# Patient Record
Sex: Female | Born: 1940 | Race: Black or African American | Hispanic: No | Smoking: Former smoker
Health system: Southern US, Community
[De-identification: ages and names within clinical notes are randomized; demographics above are authoritative.]

## PROBLEM LIST (undated history)

## (undated) DIAGNOSIS — I720 Aneurysm of carotid artery: Secondary | ICD-10-CM

## (undated) DIAGNOSIS — G4730 Sleep apnea, unspecified: Secondary | ICD-10-CM

## (undated) DIAGNOSIS — K589 Irritable bowel syndrome without diarrhea: Secondary | ICD-10-CM

## (undated) DIAGNOSIS — M545 Low back pain, unspecified: Secondary | ICD-10-CM

## (undated) DIAGNOSIS — F32A Depression, unspecified: Secondary | ICD-10-CM

## (undated) DIAGNOSIS — F419 Anxiety disorder, unspecified: Secondary | ICD-10-CM

## (undated) DIAGNOSIS — Z860100 Personal history of colon polyps, unspecified: Secondary | ICD-10-CM

## (undated) DIAGNOSIS — Z8669 Personal history of other diseases of the nervous system and sense organs: Secondary | ICD-10-CM

## (undated) DIAGNOSIS — M1990 Unspecified osteoarthritis, unspecified site: Secondary | ICD-10-CM

## (undated) DIAGNOSIS — K219 Gastro-esophageal reflux disease without esophagitis: Secondary | ICD-10-CM

## (undated) DIAGNOSIS — F329 Major depressive disorder, single episode, unspecified: Secondary | ICD-10-CM

## (undated) DIAGNOSIS — I10 Essential (primary) hypertension: Secondary | ICD-10-CM

## (undated) DIAGNOSIS — Z87440 Personal history of urinary (tract) infections: Secondary | ICD-10-CM

## (undated) DIAGNOSIS — E039 Hypothyroidism, unspecified: Secondary | ICD-10-CM

## (undated) DIAGNOSIS — M810 Age-related osteoporosis without current pathological fracture: Secondary | ICD-10-CM

## (undated) DIAGNOSIS — I639 Cerebral infarction, unspecified: Secondary | ICD-10-CM

## (undated) DIAGNOSIS — N319 Neuromuscular dysfunction of bladder, unspecified: Secondary | ICD-10-CM

## (undated) DIAGNOSIS — G5600 Carpal tunnel syndrome, unspecified upper limb: Secondary | ICD-10-CM

## (undated) DIAGNOSIS — E1140 Type 2 diabetes mellitus with diabetic neuropathy, unspecified: Secondary | ICD-10-CM

## (undated) DIAGNOSIS — N183 Chronic kidney disease, stage 3 unspecified: Secondary | ICD-10-CM

## (undated) DIAGNOSIS — J189 Pneumonia, unspecified organism: Secondary | ICD-10-CM

## (undated) HISTORY — DX: Gastro-esophageal reflux disease without esophagitis: K21.9

## (undated) HISTORY — PX: SHOULDER SURGERY: SHX246

## (undated) HISTORY — DX: Low back pain: M54.5

## (undated) HISTORY — DX: Personal history of colonic polyps: Z86.010

## (undated) HISTORY — DX: Anxiety disorder, unspecified: F41.9

## (undated) HISTORY — DX: Irritable bowel syndrome, unspecified: K58.9

## (undated) HISTORY — PX: VESICOVAGINAL FISTULA CLOSURE W/ TAH: SUR271

## (undated) HISTORY — PX: TUBAL LIGATION: SHX77

## (undated) HISTORY — DX: Unspecified osteoarthritis, unspecified site: M19.90

## (undated) HISTORY — DX: Personal history of urinary (tract) infections: Z87.440

## (undated) HISTORY — DX: Personal history of colon polyps, unspecified: Z86.0100

## (undated) HISTORY — DX: Hypothyroidism, unspecified: E03.9

## (undated) HISTORY — PX: EYE SURGERY: SHX253

## (undated) HISTORY — PX: OTHER SURGICAL HISTORY: SHX169

## (undated) HISTORY — PX: CARDIAC CATHETERIZATION: SHX172

## (undated) HISTORY — DX: Depression, unspecified: F32.A

## (undated) HISTORY — DX: Chronic kidney disease, stage 3 (moderate): N18.3

## (undated) HISTORY — DX: Neuromuscular dysfunction of bladder, unspecified: N31.9

## (undated) HISTORY — DX: Essential (primary) hypertension: I10

## (undated) HISTORY — DX: Major depressive disorder, single episode, unspecified: F32.9

## (undated) HISTORY — DX: Sleep apnea, unspecified: G47.30

## (undated) HISTORY — DX: Carpal tunnel syndrome, unspecified upper limb: G56.00

## (undated) HISTORY — DX: Low back pain, unspecified: M54.50

## (undated) HISTORY — PX: CATARACT EXTRACTION W/ INTRAOCULAR LENS IMPLANT: SHX1309

## (undated) HISTORY — DX: Personal history of other diseases of the nervous system and sense organs: Z86.69

## (undated) HISTORY — DX: Type 2 diabetes mellitus with diabetic neuropathy, unspecified: E11.40

## (undated) HISTORY — DX: Chronic kidney disease, stage 3 unspecified: N18.30

## (undated) HISTORY — PX: SPINAL CORD STIMULATOR INSERTION: SHX5378

## (undated) HISTORY — DX: Aneurysm of carotid artery: I72.0

## (undated) HISTORY — PX: ABDOMINAL HYSTERECTOMY: SHX81

## (undated) HISTORY — DX: Morbid (severe) obesity due to excess calories: E66.01

## (undated) HISTORY — DX: Cerebral infarction, unspecified: I63.9

## (undated) HISTORY — DX: Age-related osteoporosis without current pathological fracture: M81.0

---

## 1990-04-03 HISTORY — PX: OTHER SURGICAL HISTORY: SHX169

## 1998-02-22 ENCOUNTER — Ambulatory Visit (HOSPITAL_COMMUNITY): Admission: RE | Admit: 1998-02-22 | Discharge: 1998-02-22 | Payer: Self-pay | Admitting: Cardiovascular Disease

## 1998-10-10 ENCOUNTER — Inpatient Hospital Stay (HOSPITAL_COMMUNITY): Admission: EM | Admit: 1998-10-10 | Discharge: 1998-10-13 | Payer: Self-pay | Admitting: Emergency Medicine

## 2000-04-03 HISTORY — PX: OTHER SURGICAL HISTORY: SHX169

## 2000-05-12 ENCOUNTER — Ambulatory Visit (HOSPITAL_COMMUNITY): Admission: RE | Admit: 2000-05-12 | Discharge: 2000-05-12 | Payer: Self-pay | Admitting: Sports Medicine

## 2000-05-12 ENCOUNTER — Encounter: Payer: Self-pay | Admitting: Sports Medicine

## 2000-06-07 ENCOUNTER — Encounter: Payer: Self-pay | Admitting: Orthopedic Surgery

## 2000-06-13 ENCOUNTER — Encounter: Payer: Self-pay | Admitting: Orthopedic Surgery

## 2000-06-13 ENCOUNTER — Inpatient Hospital Stay (HOSPITAL_COMMUNITY): Admission: RE | Admit: 2000-06-13 | Discharge: 2000-06-18 | Payer: Self-pay | Admitting: Orthopedic Surgery

## 2000-06-15 ENCOUNTER — Encounter: Payer: Self-pay | Admitting: Orthopedic Surgery

## 2000-07-23 ENCOUNTER — Encounter: Payer: Self-pay | Admitting: Internal Medicine

## 2000-07-23 ENCOUNTER — Ambulatory Visit (HOSPITAL_COMMUNITY): Admission: RE | Admit: 2000-07-23 | Discharge: 2000-07-23 | Payer: Self-pay | Admitting: Internal Medicine

## 2000-07-25 ENCOUNTER — Encounter (HOSPITAL_COMMUNITY): Admission: RE | Admit: 2000-07-25 | Discharge: 2000-08-24 | Payer: Self-pay | Admitting: Orthopedic Surgery

## 2000-08-28 ENCOUNTER — Encounter (HOSPITAL_COMMUNITY): Admission: RE | Admit: 2000-08-28 | Discharge: 2000-09-27 | Payer: Self-pay | Admitting: Orthopedic Surgery

## 2000-09-01 ENCOUNTER — Encounter: Payer: Self-pay | Admitting: Family Medicine

## 2000-09-01 ENCOUNTER — Ambulatory Visit (HOSPITAL_COMMUNITY): Admission: RE | Admit: 2000-09-01 | Discharge: 2000-09-01 | Payer: Self-pay | Admitting: Family Medicine

## 2001-05-02 ENCOUNTER — Ambulatory Visit (HOSPITAL_COMMUNITY): Admission: RE | Admit: 2001-05-02 | Discharge: 2001-05-02 | Payer: Self-pay | Admitting: Cardiology

## 2001-07-07 ENCOUNTER — Emergency Department (HOSPITAL_COMMUNITY): Admission: EM | Admit: 2001-07-07 | Discharge: 2001-07-08 | Payer: Self-pay | Admitting: *Deleted

## 2001-07-18 ENCOUNTER — Encounter: Payer: Self-pay | Admitting: Family Medicine

## 2001-07-18 ENCOUNTER — Ambulatory Visit (HOSPITAL_COMMUNITY): Admission: RE | Admit: 2001-07-18 | Discharge: 2001-07-18 | Payer: Self-pay | Admitting: Family Medicine

## 2001-11-28 ENCOUNTER — Encounter: Payer: Self-pay | Admitting: Urology

## 2001-11-28 ENCOUNTER — Ambulatory Visit (HOSPITAL_COMMUNITY): Admission: RE | Admit: 2001-11-28 | Discharge: 2001-11-28 | Payer: Self-pay | Admitting: Urology

## 2002-03-19 ENCOUNTER — Encounter: Payer: Self-pay | Admitting: Cardiology

## 2002-03-19 ENCOUNTER — Encounter: Payer: Self-pay | Admitting: Emergency Medicine

## 2002-03-19 ENCOUNTER — Inpatient Hospital Stay (HOSPITAL_COMMUNITY): Admission: EM | Admit: 2002-03-19 | Discharge: 2002-03-22 | Payer: Self-pay | Admitting: Emergency Medicine

## 2002-03-20 ENCOUNTER — Encounter: Payer: Self-pay | Admitting: Cardiovascular Disease

## 2002-04-03 HISTORY — PX: CEREBRAL ANEURYSM REPAIR: SHX164

## 2002-04-18 ENCOUNTER — Ambulatory Visit (HOSPITAL_COMMUNITY): Admission: RE | Admit: 2002-04-18 | Discharge: 2002-04-18 | Payer: Self-pay | Admitting: Interventional Radiology

## 2002-04-29 ENCOUNTER — Observation Stay (HOSPITAL_COMMUNITY): Admission: RE | Admit: 2002-04-29 | Discharge: 2002-04-29 | Payer: Self-pay | Admitting: Interventional Radiology

## 2002-05-02 ENCOUNTER — Ambulatory Visit: Admission: RE | Admit: 2002-05-02 | Discharge: 2002-05-02 | Payer: Self-pay | Admitting: Interventional Radiology

## 2002-05-08 ENCOUNTER — Inpatient Hospital Stay (HOSPITAL_COMMUNITY): Admission: RE | Admit: 2002-05-08 | Discharge: 2002-05-10 | Payer: Self-pay | Admitting: Interventional Radiology

## 2002-06-24 ENCOUNTER — Encounter: Payer: Self-pay | Admitting: Family Medicine

## 2002-06-24 ENCOUNTER — Ambulatory Visit (HOSPITAL_COMMUNITY): Admission: RE | Admit: 2002-06-24 | Discharge: 2002-06-24 | Payer: Self-pay | Admitting: Family Medicine

## 2002-07-28 ENCOUNTER — Encounter (HOSPITAL_COMMUNITY): Admission: RE | Admit: 2002-07-28 | Discharge: 2002-08-27 | Payer: Self-pay | Admitting: Orthopedic Surgery

## 2002-08-06 ENCOUNTER — Ambulatory Visit (HOSPITAL_COMMUNITY): Admission: RE | Admit: 2002-08-06 | Discharge: 2002-08-06 | Payer: Self-pay | Admitting: Interventional Radiology

## 2002-09-05 ENCOUNTER — Encounter: Payer: Self-pay | Admitting: *Deleted

## 2002-09-05 ENCOUNTER — Emergency Department (HOSPITAL_COMMUNITY): Admission: EM | Admit: 2002-09-05 | Discharge: 2002-09-05 | Payer: Self-pay | Admitting: *Deleted

## 2002-10-21 ENCOUNTER — Encounter: Payer: Self-pay | Admitting: Emergency Medicine

## 2002-10-21 ENCOUNTER — Inpatient Hospital Stay (HOSPITAL_COMMUNITY): Admission: EM | Admit: 2002-10-21 | Discharge: 2002-10-23 | Payer: Self-pay | Admitting: Emergency Medicine

## 2003-03-24 ENCOUNTER — Ambulatory Visit (HOSPITAL_COMMUNITY): Admission: RE | Admit: 2003-03-24 | Discharge: 2003-03-24 | Payer: Self-pay | Admitting: Family Medicine

## 2003-03-30 ENCOUNTER — Ambulatory Visit (HOSPITAL_COMMUNITY): Admission: RE | Admit: 2003-03-30 | Discharge: 2003-03-30 | Payer: Self-pay | Admitting: Family Medicine

## 2004-04-24 ENCOUNTER — Emergency Department (HOSPITAL_COMMUNITY): Admission: EM | Admit: 2004-04-24 | Discharge: 2004-04-24 | Payer: Self-pay | Admitting: Emergency Medicine

## 2004-06-23 ENCOUNTER — Emergency Department (HOSPITAL_COMMUNITY): Admission: EM | Admit: 2004-06-23 | Discharge: 2004-06-23 | Payer: Self-pay | Admitting: Emergency Medicine

## 2004-06-24 ENCOUNTER — Ambulatory Visit (HOSPITAL_COMMUNITY): Admission: RE | Admit: 2004-06-24 | Discharge: 2004-06-24 | Payer: Self-pay | Admitting: Family Medicine

## 2004-06-28 ENCOUNTER — Emergency Department (HOSPITAL_COMMUNITY): Admission: EM | Admit: 2004-06-28 | Discharge: 2004-06-28 | Payer: Self-pay | Admitting: *Deleted

## 2004-07-01 ENCOUNTER — Ambulatory Visit (HOSPITAL_COMMUNITY): Admission: RE | Admit: 2004-07-01 | Discharge: 2004-07-01 | Payer: Self-pay | Admitting: Interventional Radiology

## 2004-11-30 ENCOUNTER — Ambulatory Visit (HOSPITAL_COMMUNITY): Admission: RE | Admit: 2004-11-30 | Discharge: 2004-11-30 | Payer: Self-pay | Admitting: Internal Medicine

## 2005-02-17 ENCOUNTER — Ambulatory Visit (HOSPITAL_COMMUNITY): Admission: RE | Admit: 2005-02-17 | Discharge: 2005-02-17 | Payer: Self-pay | Admitting: Family Medicine

## 2005-05-17 ENCOUNTER — Ambulatory Visit (HOSPITAL_COMMUNITY): Admission: RE | Admit: 2005-05-17 | Discharge: 2005-05-17 | Payer: Self-pay | Admitting: Family Medicine

## 2006-02-07 ENCOUNTER — Ambulatory Visit: Payer: Self-pay | Admitting: Internal Medicine

## 2006-02-20 ENCOUNTER — Ambulatory Visit: Payer: Self-pay | Admitting: Internal Medicine

## 2006-03-08 ENCOUNTER — Ambulatory Visit (HOSPITAL_COMMUNITY): Admission: RE | Admit: 2006-03-08 | Discharge: 2006-03-08 | Payer: Self-pay | Admitting: Family Medicine

## 2006-03-15 ENCOUNTER — Ambulatory Visit (HOSPITAL_COMMUNITY): Admission: RE | Admit: 2006-03-15 | Discharge: 2006-03-15 | Payer: Self-pay | Admitting: Orthopedic Surgery

## 2006-03-15 ENCOUNTER — Ambulatory Visit: Payer: Self-pay | Admitting: Orthopedic Surgery

## 2006-03-22 ENCOUNTER — Encounter: Payer: Self-pay | Admitting: Orthopedic Surgery

## 2006-03-30 ENCOUNTER — Ambulatory Visit (HOSPITAL_COMMUNITY): Admission: RE | Admit: 2006-03-30 | Discharge: 2006-03-30 | Payer: Self-pay | Admitting: Orthopedic Surgery

## 2006-03-30 ENCOUNTER — Encounter: Payer: Self-pay | Admitting: Orthopedic Surgery

## 2006-04-09 ENCOUNTER — Ambulatory Visit: Payer: Self-pay | Admitting: Orthopedic Surgery

## 2006-05-04 ENCOUNTER — Ambulatory Visit (HOSPITAL_COMMUNITY): Admission: RE | Admit: 2006-05-04 | Discharge: 2006-05-04 | Payer: Self-pay | Admitting: General Surgery

## 2006-05-24 ENCOUNTER — Ambulatory Visit: Payer: Self-pay | Admitting: Gastroenterology

## 2006-06-14 ENCOUNTER — Ambulatory Visit (HOSPITAL_COMMUNITY): Admission: RE | Admit: 2006-06-14 | Discharge: 2006-06-14 | Payer: Self-pay | Admitting: Gastroenterology

## 2006-06-14 ENCOUNTER — Encounter (INDEPENDENT_AMBULATORY_CARE_PROVIDER_SITE_OTHER): Payer: Self-pay | Admitting: *Deleted

## 2006-06-14 ENCOUNTER — Ambulatory Visit: Payer: Self-pay | Admitting: Gastroenterology

## 2006-06-14 HISTORY — PX: COLONOSCOPY: SHX174

## 2006-06-15 ENCOUNTER — Ambulatory Visit: Payer: Self-pay | Admitting: Physical Medicine and Rehabilitation

## 2006-06-15 ENCOUNTER — Encounter
Admission: RE | Admit: 2006-06-15 | Discharge: 2006-09-13 | Payer: Self-pay | Admitting: Physical Medicine and Rehabilitation

## 2006-07-04 ENCOUNTER — Encounter
Admission: RE | Admit: 2006-07-04 | Discharge: 2006-10-02 | Payer: Self-pay | Admitting: Physical Medicine & Rehabilitation

## 2006-07-09 ENCOUNTER — Ambulatory Visit: Payer: Self-pay | Admitting: Physical Medicine & Rehabilitation

## 2006-07-10 ENCOUNTER — Ambulatory Visit: Payer: Self-pay | Admitting: Gastroenterology

## 2006-07-20 ENCOUNTER — Emergency Department (HOSPITAL_COMMUNITY): Admission: EM | Admit: 2006-07-20 | Discharge: 2006-07-20 | Payer: Self-pay | Admitting: Emergency Medicine

## 2006-08-02 ENCOUNTER — Encounter (HOSPITAL_COMMUNITY)
Admission: RE | Admit: 2006-08-02 | Discharge: 2006-09-01 | Payer: Self-pay | Admitting: Physical Medicine and Rehabilitation

## 2006-08-21 ENCOUNTER — Ambulatory Visit: Payer: Self-pay | Admitting: Physical Medicine & Rehabilitation

## 2006-09-06 ENCOUNTER — Ambulatory Visit: Payer: Self-pay | Admitting: Gastroenterology

## 2006-09-17 ENCOUNTER — Encounter
Admission: RE | Admit: 2006-09-17 | Discharge: 2006-12-16 | Payer: Self-pay | Admitting: Physical Medicine and Rehabilitation

## 2006-11-07 ENCOUNTER — Ambulatory Visit: Payer: Self-pay | Admitting: Orthopedic Surgery

## 2006-11-30 ENCOUNTER — Ambulatory Visit (HOSPITAL_COMMUNITY): Admission: RE | Admit: 2006-11-30 | Discharge: 2006-11-30 | Payer: Self-pay | Admitting: Family Medicine

## 2006-12-11 ENCOUNTER — Ambulatory Visit: Payer: Self-pay | Admitting: Physical Medicine and Rehabilitation

## 2006-12-24 ENCOUNTER — Encounter
Admission: RE | Admit: 2006-12-24 | Discharge: 2007-03-24 | Payer: Self-pay | Admitting: Physical Medicine & Rehabilitation

## 2006-12-25 ENCOUNTER — Ambulatory Visit: Payer: Self-pay | Admitting: Physical Medicine & Rehabilitation

## 2006-12-26 ENCOUNTER — Ambulatory Visit: Payer: Self-pay | Admitting: Gastroenterology

## 2007-01-23 ENCOUNTER — Ambulatory Visit: Payer: Self-pay | Admitting: Physical Medicine and Rehabilitation

## 2007-03-19 ENCOUNTER — Ambulatory Visit: Payer: Self-pay | Admitting: Gastroenterology

## 2007-04-04 HISTORY — PX: OTHER SURGICAL HISTORY: SHX169

## 2007-04-16 ENCOUNTER — Ambulatory Visit: Payer: Self-pay | Admitting: Physical Medicine and Rehabilitation

## 2007-04-16 ENCOUNTER — Encounter
Admission: RE | Admit: 2007-04-16 | Discharge: 2007-07-15 | Payer: Self-pay | Admitting: Physical Medicine and Rehabilitation

## 2007-04-18 ENCOUNTER — Ambulatory Visit: Payer: Self-pay | Admitting: Gastroenterology

## 2007-04-30 ENCOUNTER — Emergency Department (HOSPITAL_COMMUNITY): Admission: EM | Admit: 2007-04-30 | Discharge: 2007-04-30 | Payer: Self-pay | Admitting: Emergency Medicine

## 2007-05-06 ENCOUNTER — Emergency Department (HOSPITAL_COMMUNITY): Admission: EM | Admit: 2007-05-06 | Discharge: 2007-05-06 | Payer: Self-pay | Admitting: Emergency Medicine

## 2007-05-06 LAB — CONVERTED CEMR LAB
ALT: 18 units/L
AST: 21 units/L
Albumin: 3.8 g/dL
Basophils Absolute: 0 10*3/uL
Basophils Relative: 0 %
Chloride: 101 meq/L
Creatinine, Ser: 1.59 mg/dL
Eosinophils Absolute: 0 10*3/uL
Eosinophils Relative: 0 %
Glucose, Bld: 95 mg/dL
HCT: 37.7 %
Lymphocytes Relative: 19 %
Lymphs Abs: 1.5 10*3/uL
MCHC: 33.9 g/dL
MCV: 87.4 fL
Monocytes Absolute: 0.3 10*3/uL
Neutro Abs: 6.3 10*3/uL
Neutrophils Relative %: 77 %
Platelets: 257 10*3/uL
Potassium: 4.8 meq/L
RBC: 4.31 M/uL
Total Bilirubin: 0.5 mg/dL

## 2007-05-07 ENCOUNTER — Ambulatory Visit (HOSPITAL_COMMUNITY): Admission: RE | Admit: 2007-05-07 | Discharge: 2007-05-07 | Payer: Self-pay | Admitting: Interventional Radiology

## 2007-05-13 ENCOUNTER — Ambulatory Visit: Payer: Self-pay | Admitting: Internal Medicine

## 2007-05-13 DIAGNOSIS — K589 Irritable bowel syndrome without diarrhea: Secondary | ICD-10-CM | POA: Insufficient documentation

## 2007-05-13 DIAGNOSIS — F329 Major depressive disorder, single episode, unspecified: Secondary | ICD-10-CM

## 2007-05-13 DIAGNOSIS — E1122 Type 2 diabetes mellitus with diabetic chronic kidney disease: Secondary | ICD-10-CM

## 2007-05-13 DIAGNOSIS — Z8669 Personal history of other diseases of the nervous system and sense organs: Secondary | ICD-10-CM | POA: Insufficient documentation

## 2007-05-13 DIAGNOSIS — R32 Unspecified urinary incontinence: Secondary | ICD-10-CM

## 2007-05-13 DIAGNOSIS — I10 Essential (primary) hypertension: Secondary | ICD-10-CM

## 2007-05-13 DIAGNOSIS — Z87898 Personal history of other specified conditions: Secondary | ICD-10-CM | POA: Insufficient documentation

## 2007-05-13 DIAGNOSIS — M129 Arthropathy, unspecified: Secondary | ICD-10-CM | POA: Insufficient documentation

## 2007-05-13 DIAGNOSIS — J45909 Unspecified asthma, uncomplicated: Secondary | ICD-10-CM | POA: Insufficient documentation

## 2007-05-13 DIAGNOSIS — K219 Gastro-esophageal reflux disease without esophagitis: Secondary | ICD-10-CM

## 2007-05-13 DIAGNOSIS — N183 Chronic kidney disease, stage 3 (moderate): Secondary | ICD-10-CM

## 2007-05-13 DIAGNOSIS — N318 Other neuromuscular dysfunction of bladder: Secondary | ICD-10-CM | POA: Insufficient documentation

## 2007-05-13 DIAGNOSIS — R109 Unspecified abdominal pain: Secondary | ICD-10-CM

## 2007-05-13 DIAGNOSIS — N390 Urinary tract infection, site not specified: Secondary | ICD-10-CM

## 2007-05-13 LAB — CONVERTED CEMR LAB
Blood Glucose, Fingerstick: 119
Hgb A1c MFr Bld: 6 %

## 2007-05-14 ENCOUNTER — Telehealth (INDEPENDENT_AMBULATORY_CARE_PROVIDER_SITE_OTHER): Payer: Self-pay | Admitting: *Deleted

## 2007-05-14 ENCOUNTER — Encounter (INDEPENDENT_AMBULATORY_CARE_PROVIDER_SITE_OTHER): Payer: Self-pay | Admitting: Internal Medicine

## 2007-05-14 LAB — CONVERTED CEMR LAB
CO2: 18 meq/L — ABNORMAL LOW (ref 19–32)
Calcium: 9.1 mg/dL (ref 8.4–10.5)
Sodium: 137 meq/L (ref 135–145)

## 2007-05-20 ENCOUNTER — Ambulatory Visit (HOSPITAL_COMMUNITY): Admission: RE | Admit: 2007-05-20 | Discharge: 2007-05-20 | Payer: Self-pay | Admitting: Internal Medicine

## 2007-05-21 ENCOUNTER — Ambulatory Visit: Payer: Self-pay | Admitting: Physical Medicine and Rehabilitation

## 2007-05-21 ENCOUNTER — Encounter (HOSPITAL_COMMUNITY): Admission: RE | Admit: 2007-05-21 | Discharge: 2007-06-20 | Payer: Self-pay | Admitting: Internal Medicine

## 2007-05-23 ENCOUNTER — Telehealth (INDEPENDENT_AMBULATORY_CARE_PROVIDER_SITE_OTHER): Payer: Self-pay | Admitting: *Deleted

## 2007-05-24 ENCOUNTER — Encounter (INDEPENDENT_AMBULATORY_CARE_PROVIDER_SITE_OTHER): Payer: Self-pay | Admitting: Internal Medicine

## 2007-05-28 ENCOUNTER — Encounter (INDEPENDENT_AMBULATORY_CARE_PROVIDER_SITE_OTHER): Payer: Self-pay | Admitting: Internal Medicine

## 2007-05-28 ENCOUNTER — Telehealth (INDEPENDENT_AMBULATORY_CARE_PROVIDER_SITE_OTHER): Payer: Self-pay | Admitting: *Deleted

## 2007-05-29 ENCOUNTER — Encounter (INDEPENDENT_AMBULATORY_CARE_PROVIDER_SITE_OTHER): Payer: Self-pay | Admitting: Internal Medicine

## 2007-05-31 ENCOUNTER — Telehealth (INDEPENDENT_AMBULATORY_CARE_PROVIDER_SITE_OTHER): Payer: Self-pay | Admitting: *Deleted

## 2007-06-18 ENCOUNTER — Ambulatory Visit: Payer: Self-pay | Admitting: Internal Medicine

## 2007-06-18 DIAGNOSIS — F411 Generalized anxiety disorder: Secondary | ICD-10-CM | POA: Insufficient documentation

## 2007-06-18 LAB — CONVERTED CEMR LAB: Hemoglobin: 12.1 g/dL

## 2007-06-28 ENCOUNTER — Encounter (INDEPENDENT_AMBULATORY_CARE_PROVIDER_SITE_OTHER): Payer: Self-pay | Admitting: Internal Medicine

## 2007-07-05 ENCOUNTER — Ambulatory Visit: Payer: Self-pay | Admitting: Internal Medicine

## 2007-07-05 DIAGNOSIS — R609 Edema, unspecified: Secondary | ICD-10-CM | POA: Insufficient documentation

## 2007-07-08 ENCOUNTER — Telehealth (INDEPENDENT_AMBULATORY_CARE_PROVIDER_SITE_OTHER): Payer: Self-pay | Admitting: Internal Medicine

## 2007-07-10 ENCOUNTER — Encounter
Admission: RE | Admit: 2007-07-10 | Discharge: 2007-07-10 | Payer: Self-pay | Admitting: Physical Medicine and Rehabilitation

## 2007-07-26 ENCOUNTER — Ambulatory Visit: Payer: Self-pay | Admitting: Internal Medicine

## 2007-07-26 ENCOUNTER — Encounter (INDEPENDENT_AMBULATORY_CARE_PROVIDER_SITE_OTHER): Payer: Self-pay | Admitting: Internal Medicine

## 2007-07-29 ENCOUNTER — Ambulatory Visit (HOSPITAL_COMMUNITY): Admission: RE | Admit: 2007-07-29 | Discharge: 2007-07-29 | Payer: Self-pay | Admitting: Internal Medicine

## 2007-08-08 ENCOUNTER — Encounter (INDEPENDENT_AMBULATORY_CARE_PROVIDER_SITE_OTHER): Payer: Self-pay | Admitting: Internal Medicine

## 2007-08-08 ENCOUNTER — Ambulatory Visit: Payer: Self-pay | Admitting: Gastroenterology

## 2007-08-12 ENCOUNTER — Ambulatory Visit (HOSPITAL_COMMUNITY): Admission: RE | Admit: 2007-08-12 | Discharge: 2007-08-12 | Payer: Self-pay | Admitting: Gastroenterology

## 2007-08-12 ENCOUNTER — Ambulatory Visit: Payer: Self-pay | Admitting: Gastroenterology

## 2007-08-12 ENCOUNTER — Encounter: Payer: Self-pay | Admitting: Gastroenterology

## 2007-08-12 HISTORY — PX: ESOPHAGOGASTRODUODENOSCOPY: SHX1529

## 2007-08-15 ENCOUNTER — Ambulatory Visit: Payer: Self-pay | Admitting: Gastroenterology

## 2007-08-28 ENCOUNTER — Ambulatory Visit (HOSPITAL_COMMUNITY): Admission: RE | Admit: 2007-08-28 | Discharge: 2007-08-28 | Payer: Self-pay | Admitting: General Surgery

## 2007-09-16 ENCOUNTER — Encounter (INDEPENDENT_AMBULATORY_CARE_PROVIDER_SITE_OTHER): Payer: Self-pay | Admitting: Internal Medicine

## 2007-09-18 ENCOUNTER — Encounter (HOSPITAL_COMMUNITY): Admission: RE | Admit: 2007-09-18 | Discharge: 2007-10-18 | Payer: Self-pay | Admitting: Gastroenterology

## 2007-09-25 ENCOUNTER — Ambulatory Visit: Payer: Self-pay | Admitting: Internal Medicine

## 2007-09-26 ENCOUNTER — Encounter (INDEPENDENT_AMBULATORY_CARE_PROVIDER_SITE_OTHER): Payer: Self-pay | Admitting: Internal Medicine

## 2007-09-27 LAB — CONVERTED CEMR LAB
Creatinine, Urine: 146.6 mg/dL
Microalb, Ur: 0.97 mg/dL (ref 0.00–1.89)

## 2007-10-08 ENCOUNTER — Encounter (INDEPENDENT_AMBULATORY_CARE_PROVIDER_SITE_OTHER): Payer: Self-pay | Admitting: Internal Medicine

## 2007-10-24 ENCOUNTER — Ambulatory Visit: Payer: Self-pay | Admitting: Gastroenterology

## 2007-10-24 ENCOUNTER — Encounter (INDEPENDENT_AMBULATORY_CARE_PROVIDER_SITE_OTHER): Payer: Self-pay | Admitting: Internal Medicine

## 2007-10-25 ENCOUNTER — Encounter (INDEPENDENT_AMBULATORY_CARE_PROVIDER_SITE_OTHER): Payer: Self-pay | Admitting: Internal Medicine

## 2007-11-05 ENCOUNTER — Ambulatory Visit: Payer: Self-pay | Admitting: Internal Medicine

## 2007-11-06 ENCOUNTER — Encounter (INDEPENDENT_AMBULATORY_CARE_PROVIDER_SITE_OTHER): Payer: Self-pay | Admitting: Internal Medicine

## 2007-11-07 LAB — CONVERTED CEMR LAB
ALT: 14 units/L (ref 0–35)
AST: 13 units/L (ref 0–37)
Albumin: 4.3 g/dL (ref 3.5–5.2)
CO2: 24 meq/L (ref 19–32)
Calcium: 8.7 mg/dL (ref 8.4–10.5)
Chloride: 104 meq/L (ref 96–112)
Glucose, Bld: 138 mg/dL — ABNORMAL HIGH (ref 70–99)
Potassium: 4.2 meq/L (ref 3.5–5.3)
TSH: 1.589 microintl units/mL (ref 0.350–4.50)

## 2007-11-20 ENCOUNTER — Ambulatory Visit: Payer: Self-pay | Admitting: Internal Medicine

## 2007-11-21 ENCOUNTER — Encounter (INDEPENDENT_AMBULATORY_CARE_PROVIDER_SITE_OTHER): Payer: Self-pay | Admitting: Internal Medicine

## 2007-11-21 LAB — CONVERTED CEMR LAB
ALT: 18 units/L (ref 0–35)
AST: 23 units/L (ref 0–37)
Albumin: 4.7 g/dL (ref 3.5–5.2)
Alkaline Phosphatase: 84 units/L (ref 39–117)
BUN: 17 mg/dL (ref 6–23)
CO2: 22 meq/L (ref 19–32)
Calcium: 9.2 mg/dL (ref 8.4–10.5)
Glucose, Bld: 135 mg/dL — ABNORMAL HIGH (ref 70–99)
Potassium: 4 meq/L (ref 3.5–5.3)
Sodium: 141 meq/L (ref 135–145)
TSH: 2.886 microintl units/mL (ref 0.350–4.50)
Total Bilirubin: 0.4 mg/dL (ref 0.3–1.2)

## 2007-12-03 ENCOUNTER — Ambulatory Visit (HOSPITAL_COMMUNITY): Admission: RE | Admit: 2007-12-03 | Discharge: 2007-12-03 | Payer: Self-pay | Admitting: Internal Medicine

## 2007-12-25 ENCOUNTER — Ambulatory Visit: Payer: Self-pay | Admitting: Internal Medicine

## 2007-12-25 LAB — CONVERTED CEMR LAB: Hgb A1c MFr Bld: 6.3 %

## 2008-01-14 ENCOUNTER — Encounter (INDEPENDENT_AMBULATORY_CARE_PROVIDER_SITE_OTHER): Payer: Self-pay | Admitting: Internal Medicine

## 2008-01-14 ENCOUNTER — Ambulatory Visit: Payer: Self-pay | Admitting: Gastroenterology

## 2008-01-17 ENCOUNTER — Encounter (INDEPENDENT_AMBULATORY_CARE_PROVIDER_SITE_OTHER): Payer: Self-pay | Admitting: Internal Medicine

## 2008-01-27 ENCOUNTER — Ambulatory Visit: Payer: Self-pay | Admitting: Internal Medicine

## 2008-02-21 ENCOUNTER — Encounter (INDEPENDENT_AMBULATORY_CARE_PROVIDER_SITE_OTHER): Payer: Self-pay | Admitting: Internal Medicine

## 2008-02-22 ENCOUNTER — Emergency Department (HOSPITAL_COMMUNITY): Admission: EM | Admit: 2008-02-22 | Discharge: 2008-02-22 | Payer: Self-pay | Admitting: Emergency Medicine

## 2008-03-03 ENCOUNTER — Ambulatory Visit (HOSPITAL_BASED_OUTPATIENT_CLINIC_OR_DEPARTMENT_OTHER): Admission: RE | Admit: 2008-03-03 | Discharge: 2008-03-03 | Payer: Self-pay | Admitting: Urology

## 2008-03-13 ENCOUNTER — Ambulatory Visit: Payer: Self-pay | Admitting: Internal Medicine

## 2008-03-24 ENCOUNTER — Ambulatory Visit: Payer: Self-pay | Admitting: Internal Medicine

## 2008-03-24 LAB — CONVERTED CEMR LAB: Blood Glucose, Fingerstick: 149

## 2008-04-09 ENCOUNTER — Encounter (INDEPENDENT_AMBULATORY_CARE_PROVIDER_SITE_OTHER): Payer: Self-pay | Admitting: Internal Medicine

## 2008-04-16 ENCOUNTER — Encounter (INDEPENDENT_AMBULATORY_CARE_PROVIDER_SITE_OTHER): Payer: Self-pay | Admitting: Internal Medicine

## 2008-04-17 ENCOUNTER — Telehealth (INDEPENDENT_AMBULATORY_CARE_PROVIDER_SITE_OTHER): Payer: Self-pay | Admitting: Internal Medicine

## 2008-05-12 ENCOUNTER — Ambulatory Visit: Payer: Self-pay | Admitting: Internal Medicine

## 2008-05-12 LAB — CONVERTED CEMR LAB
Bilirubin Urine: NEGATIVE
Protein, U semiquant: 100
Specific Gravity, Urine: 1.025

## 2008-05-13 ENCOUNTER — Encounter (INDEPENDENT_AMBULATORY_CARE_PROVIDER_SITE_OTHER): Payer: Self-pay | Admitting: Internal Medicine

## 2008-06-15 ENCOUNTER — Encounter (INDEPENDENT_AMBULATORY_CARE_PROVIDER_SITE_OTHER): Payer: Self-pay | Admitting: Internal Medicine

## 2008-06-23 ENCOUNTER — Ambulatory Visit: Payer: Self-pay | Admitting: Internal Medicine

## 2008-06-23 DIAGNOSIS — R51 Headache: Secondary | ICD-10-CM

## 2008-06-23 DIAGNOSIS — R519 Headache, unspecified: Secondary | ICD-10-CM | POA: Insufficient documentation

## 2008-06-23 DIAGNOSIS — G4700 Insomnia, unspecified: Secondary | ICD-10-CM

## 2008-06-23 LAB — HM DIABETES FOOT EXAM

## 2008-06-23 LAB — CONVERTED CEMR LAB: Blood Glucose, Fingerstick: 188

## 2008-06-24 ENCOUNTER — Encounter (INDEPENDENT_AMBULATORY_CARE_PROVIDER_SITE_OTHER): Payer: Self-pay | Admitting: Internal Medicine

## 2008-06-25 LAB — CONVERTED CEMR LAB
ALT: 12 units/L (ref 0–35)
AST: 16 units/L (ref 0–37)
Alkaline Phosphatase: 84 units/L (ref 39–117)
Basophils Relative: 1 % (ref 0–1)
CO2: 24 meq/L (ref 19–32)
Creatinine, Ser: 1.24 mg/dL — ABNORMAL HIGH (ref 0.40–1.20)
Eosinophils Absolute: 0.1 10*3/uL (ref 0.0–0.7)
LDL Cholesterol: 103 mg/dL — ABNORMAL HIGH (ref 0–99)
MCHC: 30.1 g/dL (ref 30.0–36.0)
MCV: 90 fL (ref 78.0–100.0)
Monocytes Relative: 6 % (ref 3–12)
Neutro Abs: 3.2 10*3/uL (ref 1.7–7.7)
Neutrophils Relative %: 55 % (ref 43–77)
Platelets: 255 10*3/uL (ref 150–400)
RBC: 4.21 M/uL (ref 3.87–5.11)
Sodium: 143 meq/L (ref 135–145)
Total Bilirubin: 0.3 mg/dL (ref 0.3–1.2)
Total CHOL/HDL Ratio: 3.2
Total Protein: 7.2 g/dL (ref 6.0–8.3)
VLDL: 22 mg/dL (ref 0–40)
WBC: 5.9 10*3/uL (ref 4.0–10.5)

## 2008-06-26 ENCOUNTER — Ambulatory Visit: Payer: Self-pay | Admitting: Internal Medicine

## 2008-06-26 DIAGNOSIS — J45901 Unspecified asthma with (acute) exacerbation: Secondary | ICD-10-CM | POA: Insufficient documentation

## 2008-07-03 ENCOUNTER — Encounter (INDEPENDENT_AMBULATORY_CARE_PROVIDER_SITE_OTHER): Payer: Self-pay | Admitting: Internal Medicine

## 2008-07-06 ENCOUNTER — Ambulatory Visit (HOSPITAL_COMMUNITY): Admission: RE | Admit: 2008-07-06 | Discharge: 2008-07-06 | Payer: Self-pay | Admitting: Internal Medicine

## 2008-07-23 ENCOUNTER — Ambulatory Visit: Payer: Self-pay | Admitting: Gastroenterology

## 2008-07-23 DIAGNOSIS — R112 Nausea with vomiting, unspecified: Secondary | ICD-10-CM

## 2008-08-11 ENCOUNTER — Encounter (INDEPENDENT_AMBULATORY_CARE_PROVIDER_SITE_OTHER): Payer: Self-pay | Admitting: Internal Medicine

## 2008-08-11 LAB — HM DIABETES EYE EXAM: HM Diabetic Eye Exam: NORMAL

## 2008-09-07 ENCOUNTER — Ambulatory Visit: Payer: Self-pay | Admitting: Internal Medicine

## 2008-09-07 DIAGNOSIS — M79609 Pain in unspecified limb: Secondary | ICD-10-CM

## 2008-09-07 DIAGNOSIS — M25519 Pain in unspecified shoulder: Secondary | ICD-10-CM

## 2008-11-16 ENCOUNTER — Ambulatory Visit: Payer: Self-pay | Admitting: Internal Medicine

## 2009-01-06 ENCOUNTER — Ambulatory Visit (HOSPITAL_COMMUNITY): Admission: RE | Admit: 2009-01-06 | Discharge: 2009-01-06 | Payer: Self-pay | Admitting: Family Medicine

## 2009-01-13 ENCOUNTER — Ambulatory Visit (HOSPITAL_COMMUNITY): Admission: RE | Admit: 2009-01-13 | Discharge: 2009-01-13 | Payer: Self-pay | Admitting: Family Medicine

## 2009-02-24 ENCOUNTER — Ambulatory Visit: Payer: Self-pay | Admitting: Orthopedic Surgery

## 2009-02-24 DIAGNOSIS — M1710 Unilateral primary osteoarthritis, unspecified knee: Secondary | ICD-10-CM

## 2009-02-24 DIAGNOSIS — M5126 Other intervertebral disc displacement, lumbar region: Secondary | ICD-10-CM

## 2009-02-24 DIAGNOSIS — IMO0002 Reserved for concepts with insufficient information to code with codable children: Secondary | ICD-10-CM | POA: Insufficient documentation

## 2009-03-09 ENCOUNTER — Telehealth: Payer: Self-pay | Admitting: Orthopedic Surgery

## 2009-03-10 ENCOUNTER — Ambulatory Visit (HOSPITAL_COMMUNITY): Admission: RE | Admit: 2009-03-10 | Discharge: 2009-03-10 | Payer: Self-pay | Admitting: Orthopedic Surgery

## 2009-03-17 ENCOUNTER — Ambulatory Visit: Payer: Self-pay | Admitting: Orthopedic Surgery

## 2009-03-23 ENCOUNTER — Telehealth: Payer: Self-pay | Admitting: Orthopedic Surgery

## 2009-03-24 ENCOUNTER — Telehealth: Payer: Self-pay | Admitting: Orthopedic Surgery

## 2009-03-31 ENCOUNTER — Telehealth (INDEPENDENT_AMBULATORY_CARE_PROVIDER_SITE_OTHER): Payer: Self-pay | Admitting: *Deleted

## 2009-04-07 ENCOUNTER — Telehealth: Payer: Self-pay | Admitting: Orthopedic Surgery

## 2009-04-20 ENCOUNTER — Encounter: Payer: Self-pay | Admitting: Orthopedic Surgery

## 2009-04-27 ENCOUNTER — Ambulatory Visit: Payer: Self-pay | Admitting: Gastroenterology

## 2009-06-22 ENCOUNTER — Ambulatory Visit (HOSPITAL_COMMUNITY): Admission: RE | Admit: 2009-06-22 | Discharge: 2009-06-22 | Payer: Self-pay | Admitting: Ophthalmology

## 2009-07-13 ENCOUNTER — Ambulatory Visit (HOSPITAL_COMMUNITY): Admission: RE | Admit: 2009-07-13 | Discharge: 2009-07-13 | Payer: Self-pay | Admitting: Ophthalmology

## 2009-08-28 ENCOUNTER — Emergency Department (HOSPITAL_COMMUNITY): Admission: EM | Admit: 2009-08-28 | Discharge: 2009-08-28 | Payer: Self-pay | Admitting: Emergency Medicine

## 2009-10-12 ENCOUNTER — Encounter (INDEPENDENT_AMBULATORY_CARE_PROVIDER_SITE_OTHER): Payer: Self-pay

## 2009-12-01 ENCOUNTER — Emergency Department (HOSPITAL_COMMUNITY): Admission: EM | Admit: 2009-12-01 | Discharge: 2009-12-01 | Payer: Self-pay | Admitting: Emergency Medicine

## 2010-01-11 ENCOUNTER — Ambulatory Visit (HOSPITAL_COMMUNITY): Admission: RE | Admit: 2010-01-11 | Discharge: 2010-01-11 | Payer: Self-pay | Admitting: Family Medicine

## 2010-01-14 ENCOUNTER — Encounter: Payer: Self-pay | Admitting: Orthopedic Surgery

## 2010-02-09 ENCOUNTER — Encounter: Payer: Self-pay | Admitting: Gastroenterology

## 2010-02-09 ENCOUNTER — Ambulatory Visit: Payer: Self-pay | Admitting: Gastroenterology

## 2010-02-09 DIAGNOSIS — R7401 Elevation of levels of liver transaminase levels: Secondary | ICD-10-CM | POA: Insufficient documentation

## 2010-02-09 DIAGNOSIS — R740 Nonspecific elevation of levels of transaminase and lactic acid dehydrogenase [LDH]: Secondary | ICD-10-CM

## 2010-02-10 ENCOUNTER — Ambulatory Visit (HOSPITAL_COMMUNITY): Admission: RE | Admit: 2010-02-10 | Discharge: 2010-02-10 | Payer: Self-pay | Admitting: Gastroenterology

## 2010-02-11 ENCOUNTER — Encounter: Payer: Self-pay | Admitting: Gastroenterology

## 2010-02-11 LAB — CONVERTED CEMR LAB
ALT: 107 units/L — ABNORMAL HIGH (ref 0–35)
Bilirubin, Direct: 0.2 mg/dL (ref 0.0–0.3)
Indirect Bilirubin: 0.5 mg/dL (ref 0.0–0.9)
Total Bilirubin: 0.7 mg/dL (ref 0.3–1.2)

## 2010-03-04 LAB — CONVERTED CEMR LAB
HCV Ab: NEGATIVE
Hep A IgM: NEGATIVE
Hep B C IgM: NEGATIVE
Hepatitis B Surface Ag: NEGATIVE
IgA: 388 mg/dL — ABNORMAL HIGH (ref 68–378)
IgG (Immunoglobin G), Serum: 1460 mg/dL (ref 694–1618)

## 2010-03-15 ENCOUNTER — Ambulatory Visit: Payer: Self-pay | Admitting: Gastroenterology

## 2010-03-23 ENCOUNTER — Ambulatory Visit: Payer: Self-pay | Admitting: Orthopedic Surgery

## 2010-03-23 DIAGNOSIS — M75120 Complete rotator cuff tear or rupture of unspecified shoulder, not specified as traumatic: Secondary | ICD-10-CM

## 2010-03-29 ENCOUNTER — Encounter: Payer: Self-pay | Admitting: Orthopedic Surgery

## 2010-03-31 ENCOUNTER — Telehealth: Payer: Self-pay | Admitting: Orthopedic Surgery

## 2010-04-05 ENCOUNTER — Ambulatory Visit (HOSPITAL_COMMUNITY)
Admission: RE | Admit: 2010-04-05 | Discharge: 2010-04-05 | Payer: Self-pay | Source: Home / Self Care | Attending: Orthopedic Surgery | Admitting: Orthopedic Surgery

## 2010-04-13 ENCOUNTER — Ambulatory Visit
Admission: RE | Admit: 2010-04-13 | Discharge: 2010-04-13 | Payer: Self-pay | Source: Home / Self Care | Attending: Orthopedic Surgery | Admitting: Orthopedic Surgery

## 2010-04-20 ENCOUNTER — Encounter (HOSPITAL_COMMUNITY)
Admission: RE | Admit: 2010-04-20 | Discharge: 2010-05-03 | Payer: Self-pay | Source: Home / Self Care | Attending: Orthopedic Surgery | Admitting: Orthopedic Surgery

## 2010-04-23 ENCOUNTER — Encounter: Payer: Self-pay | Admitting: Orthopedic Surgery

## 2010-05-04 ENCOUNTER — Ambulatory Visit (HOSPITAL_COMMUNITY)
Admission: RE | Admit: 2010-05-04 | Discharge: 2010-05-04 | Disposition: A | Discharge status: Home / Self Care | Payer: Medicare Other | Source: Ambulatory Visit | Attending: Orthopedic Surgery | Admitting: Orthopedic Surgery

## 2010-05-05 NOTE — Assessment & Plan Note (Signed)
Summary: review mri results aph/uhc/wkj   Visit Type:  Follow-up Referring Provider:  Dr. [name]  Primary Provider:  [name], M.D.  CC:  right shoulder pain.  History of Present Illness: I saw [name] in the office today for new problem visit.  She is a 70 years old woman with the complaint of:   right shoulder pain.  This 70 year old female was working out at [employer]. several weeks ago and was using one of the machines and felt a pop in her shoulder    Treatment: Patient Instructions: 1)  sling, and medication for pain, MRI RIGHT shoulder for rotator cuff tear Prescriptions: NORCO 5-325 MG TABS (HYDROCODONE-ACETAMINOPHEN) 1 ev 4 hours as needed for pain  #42 x 1  Meds:Actos, Diazepam, Doxepin, Lasix, Glimepiride, Metfromin, Omeprazole, Phenazopyridine, Pravastatin, Eye drops, Vitamin D2, Norco 5 for pain helps alot.  Her shoulder is a whole lot better.  Today reviewing MRI results taken 04/05/10 [facility] right shoulder.  IMPRESSION:   1.  Moderate rotator cuff tendinopathy/tendinosis.  Shallow articular surface tear involving the supraspinatus tendon as discussed above. 2.  Long head biceps tendinopathy. 3.  Degenerative labral changes without discrete labral tear. 4.  AC joint generative changes and lateral downsloping of the acromion may contribute to bony impingement.    Allergies: No Known Drug Allergies   Impression & Recommendations:  Problem # 1:  RUPTURE ROTATOR CUFF (ICD-727.61) Assessment Comment Only  The MRI was done at [facility] and it was reviewed with the report  she has basically a partial articular surface type tear of the rotator cuff with some tendinosis and tendinopathy is mainly in the articular surface. There are some labral changes, which are degenerative. There is some a.c. joint degeneration as well.  But she has improved in terms of her pain. She is she is still having some functional deficits.  Recommend physical therapy for one month. When  she returns if she has not improved, then rotator cuff repair would be indicated.  Results discussed   Orders: Physical Therapy Referral (PT) Est. Patient Level II (84696)  Patient Instructions: 1)  Go for therapy 2)  come back in a month for recheck right shoulder   Orders Added: 1)  Physical Therapy Referral [PT] 2)  Est. Patient Level II [29528]

## 2010-05-05 NOTE — Assessment & Plan Note (Signed)
Summary: GASTRIC DYSRHYTHMIA,GERD, vomiting   Visit Type:  Follow-up Visit Primary Care Provider:  [name], M.D.  Chief Complaint:  follow up and doing good.  History of Present Illness: Staying out of trouble. Getting shots in back and sugar went up. Fasting sugar: 240s. Off ASA for epidural and Actos because she belives it makes her sugar go higher. After epidural going [facility] the "[facility] Seeing Dr. Eduard Clos giving her injections. No vomiting and taking Reglan BID.   Current Medications (verified): 1)  Reglan 5 Mg  Tabs (Metoclopramide Hcl) .Marland Kitchen.. 1 By Mouth Before Meals and At Bedtime 2)  Childrens Aspirin Low Strength 81 Mg  Chew (Aspirin) .Marland Kitchen.. 1 By Mouth Once Daily 3)  Actos 45 Mg  Tabs (Pioglitazone Hcl) .Marland Kitchen.. 1 By Mouth Once Daily 4)  Aleve 220 Mg  Tabs (Naproxen Sodium) .... As Needed 5)  Flonase 50 Mcg/act  Susp (Fluticasone Propionate) .... As Needed 6)  Symbicort 160-4.5 Mcg/act Aero (Budesonide-Formoterol Fumarate) .... 2 Puffs Two Times A Day As Needed 7)  Albuterol Sulfate (2.5 Mg/77ml) 0.083%  Nebu (Albuterol Sulfate) .Marland Kitchen.. 1 in Nebulizer Once Daily As Needed 8)  Furosemide 20 Mg  Tabs (Furosemide) .Marland Kitchen.. 1 By Mouth Once Daily 9)  Omeprazole 20 Mg  Cpdr (Omeprazole) .Marland Kitchen.. 1 By Mouth Once Daily 10)  Pravastatin Sodium 20 Mg Tabs (Pravastatin Sodium) .... Take One Tab Once Daily 11)  Ventolin Hfa 108 (90 Base) Mcg/act Aers (Albuterol Sulfate) .... 2 Puffs Q 6 Hours As Needed Shortness of Breath 12)  Nitrofurantoin Macrocrystal 100 Mg Caps (Nitrofurantoin Macrocrystal) .... Two Times A Day  Allergies (verified): No Known Drug Allergies  Past History:  Past Medical History: Last updated: 02/24/2009 Asthma Situational depression Diabetes mellitus, type II GERD Hypertension Low back pain IBS overactive bladder/urinary incontinence recurrent UTI's cataracts/glaucoma arthritis migraines, hx carpal tunnel morbid obesity sleep apnea--intolerant to CPAP brain aneurysm colonic  polyps Anxiety Sleep Apnea  Vital Signs:  Patient profile:   70 year old female Height:      61 inches Weight:      236 pounds BMI:     44.75 Temp:     97.5 degrees F oral Pulse rate:   80 / minute BP sitting:   124 / 80  (left arm) Cuff size:   large  Vitals Entered By: Hendricks Limes LPN (April 27, 2009 8:53 AM)  Physical Exam  General:  Well developed, well nourished, no acute distress. Head:  Normocephalic and atraumatic. Lungs:  Clear throughout to auscultation. Heart:  Regular rate and rhythm; no murmurs Abdomen:  Soft, nontender and nondistended. Normal bowel sounds.  Impression & Recommendations:  Problem # 1:  NAUSEA WITH VOMITING (ICD-787.01) Assessment Improved  Asymptomatic and doesn't need any refills. Continue Reglan and Omeorazole. Recommended that she sees her PCP regarding her glucose. Encourage weight loss. OPV in 6 mos.  CC: Dr. Eduard Clos and PCP  Orders: Est. Patient Level II [phone])

## 2010-05-05 NOTE — Consult Note (Signed)
Summary: Consult Rpt Dr Nickola Major  Consult Rpt Dr Nickola Major   Imported By: Cammie Sickle 05/03/2009 19:34:57  _____________________________________________________________________  External Attachment:    Type:   Image     Comment:   External Document

## 2010-05-05 NOTE — Miscellaneous (Signed)
Summary: Orders Update  Clinical Lists Changes  Orders: Added new Test order of T-Hepatitis Profile Acute [phone]) - Signed Added new Test order of T-Immunoglobulins, Quantitative [phone]) - Signed Added new Test order of T-Anti SMA ([id number]) - Signed Added new Test order of T-ANA ([id number]) - Signed

## 2010-05-05 NOTE — Letter (Signed)
Summary: ABD U/S ORDER  ABD U/S ORDER   Imported By: Ave Filter 02/09/2010 15:49:17  _____________________________________________________________________  External Attachment:    Type:   Image     Comment:   External Document

## 2010-05-05 NOTE — Miscellaneous (Signed)
Summary: waiting on clinical review for MRI [facility]  Clinical Lists Changes   ref number [id number] Tera Mater is the lady I spoke with, fx number [phone], to get response in 24 hrs, case number [id number].  * See notes 03/31/10 - MRI has been approved and scheduled.

## 2010-05-05 NOTE — Assessment & Plan Note (Signed)
Summary: RT SHOULDER PAIN NEEDS XR/UHC/BSF   Vital Signs:  Patient profile:   70 year old female Height:      61 inches Weight:      228 pounds Pulse rate:   76 / minute Resp:     16 per minute  Vitals Entered By: Fuller Canada MD (March 23, 2010 8:42 AM)  Visit Type:  new problem Referring Provider:  Dr. [name]  Primary Provider:  [name], M.D.  CC:  shoulder pain.  History of Present Illness: I saw [name] in the office today for new problem visit.  She is a 70 years old woman with the complaint of:  shoulder pain.    This 70 year old female was working out at [employer]. several weeks ago and was using one of the machines and felt a pop in her shoulder but didn't pay much attention to it however last 3 weeks she's had pain in the front of her shoulder which is described as throbbing, intensity is 7/10, it seems to come and go when it is worse in the morning and in the evening.  Symptoms worsened gradually and are worse with reaching or lifting.  She cannot lift her arm over her head.  There are no radicular symptoms.  Treatment included a pain patch over the shoulder which seemed to help somewhat.    Xrays today.  Meds:Actos, Diazepam, Doxepin, Lasix, Glimepiride, Metfromin, Omeprazole, Phenazopyridine, Pravastatin, Eye drops, Vitamin D2.    Allergies: No Known Drug Allergies  Family History: father-deceased-resipratory mother-deceased-heart disease, cancer brothers x2-DM, prostate cancer brother-x1-deceased-pancreatic cancer sisters x2-breast cancer sister x1-deceased-breast cancer, MI sister x1-deceased as infant child-51-bipolar child-50-bipolar child-48 child-47 child-40 child-38 Family History of Arthritis Family History of Diabetes  Social History: Married lives with spouse hx smoke quit Alcohol use-no Drug use-no no caffeine some college  Review of Systems Constitutional:  Complains of weight gain and fatigue; denies weight loss,  fever, and chills. Cardiovascular:  Denies chest pain, palpitations, fainting, and murmurs. Respiratory:  Complains of short of breath and wheezing; denies couch, tightness, pain on inspiration, and snoring . Gastrointestinal:  Denies heartburn, nausea, vomiting, diarrhea, constipation, and blood in your stools. Genitourinary:  Complains of painful urination; denies frequency, urgency, difficulty urinating, flank pain, and bleeding in urine. Neurologic:  Complains of dizziness; denies numbness, tingling, unsteady gait, tremors, and seizure. Musculoskeletal:  Complains of stiffness and muscle pain; denies joint pain, swelling, instability, redness, and heat. Endocrine:  Complains of excessive thirst, exessive urination, and heat or cold intolerance. Psychiatric:  Complains of anxiety; denies nervousness, depression, and hallucinations. Skin:  Denies changes in the skin, poor healing, rash, itching, and redness. HEENT:  Complains of blurred or double vision; denies eye pain, redness, and watering. Immunology:  Denies seasonal allergies, sinus problems, and allergic to bee stings. Hemoatologic:  Complains of brusing; denies easy bleeding.   Shoulder/Elbow Exam  General:    Well-developed, well-nourished,large body habitus short stature;  no deformities, normal grooming.    Skin:    Intact, no scars, lesions, rashes, cafe au lait spots or bruising.    Inspection:    Inspection is normal.    Palpation:    primary area of tenderness over the RIGHT anterolateral deltoid  Vascular:    Radial, ulnar, brachial, and axillary pulses 2+ and symmetric; capillary refill less than 2 seconds; no evidence of ischemia, clubbing, or cyanosis.    Sensory:    [name] sensation intact in the upper extremities.    Motor:  weakness in supraspinatus tendon RIGHT shoulder  LEFT shoulder strength normal  Reflexes:    Normal reflexes in the upper extremities.    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 As needed    Norco 5-325 Mg Tabs (Hydrocodone-acetaminophen) .Marland Kitchen... 1 ev 4 hours as needed for pain  Orders: New Patient Level III (09811) Shoulder x-ray,  minimum 2 views (91478)  Medications Added to Medication List This Visit: 1)  Norco 5-325 Mg Tabs (Hydrocodone-acetaminophen) .Marland Kitchen.. 1 ev 4 hours as needed for pain  Patient Instructions: 1)  sling, and medication for pain, MRI RIGHT shoulder for rotator cuff tear Prescriptions: NORCO 5-325 MG TABS (HYDROCODONE-ACETAMINOPHEN) 1 ev 4 hours as needed for pain  #42 x 1   Entered and Authorized by:   Fuller Canada MD   Signed by:   Fuller Canada MD on 03/23/2010   Method used:   Print then Give to Patient   RxID:   [id number]    Orders Added: 1)  New Patient Level III [62952] 2)  Shoulder x-ray,  minimum 2 views [73030]

## 2010-05-05 NOTE — Progress Notes (Signed)
Summary: MRI appointment.  Phone Note Outgoing Call   Call placed by: Waldon Reining,  March 31, 2010 10:00 AM Call placed to: Patient Action Taken: Appt scheduled Summary of Call: I called to give the patient her MRI appointment at [facility] on 04-05-09 at 9:30. Patient has [facility], authorization [id number]. Patient will follow up back here for results.

## 2010-05-05 NOTE — Letter (Signed)
Summary: Clinical note from Dr. Eduard Clos  Clinical note from Dr. Eduard Clos   Imported By: Jacklynn Ganong 01/14/2010 08:13:25  _____________________________________________________________________  External Attachment:    Type:   Image     Comment:   External Document  Appended Document: ABNORMAL LFT'S/LAW Differential includes: viral hepatitis, autoimmune hepatitis. eeds to be asked about OTC meds or new meds added since AUG 2011. Agree with abd u/S. Needs Acute viral hepatitis panel, quantitative immunoglobulins, anti-smooth muscle Ab, ANA. 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 She needs to lose 40 lbs and follow a low fat diet. Ask pt to pick up HO.  Appended Document: ABNORMAL LFT'S/LAW Pt informed. Diet in the mail.

## 2010-05-05 NOTE — Assessment & Plan Note (Signed)
Summary: ABNORMAL LFT'S/LAW   Visit Type:  Follow-up Visit Primary Care [name]:  [name], M.D.  CC:  F/U abnormal lft's.  History of Present Illness: [name] is a 70 year old female who presents today for investigation into reportedly elevated LFTs. She is self-referred, stating she was in a "study" and wasn't able to participate due to elevated LFTs. We actually obtained these results today during her visit, labs that were done November 18, 2009. These show significantly elevated LFTs: Alk Phos: 126, AST 238, ALT 289. She presents today with c/o episodes of feeling "hot", diaphoretic, "sick" feeling, +nausea, since August 2011. hx of chronic UTIs X 4 years. no loss of appetite, no significant weight loss. denies abdominal pain. appears to be very brittle diabetic. glucose noted to be 521 in august in the ED.   Past labs: June 2009: Alk Phos: 65 AST 16, ALT 14,  May 2011: AST: 85, ALT 74 August 2011: 521  Current Medications (verified): 1)  Reglan 5 Mg  Tabs (Metoclopramide Hcl) .Marland Kitchen.. 1 By Mouth Before Meals and At Bedtime 2)  Aleve 220 Mg  Tabs (Naproxen Sodium) .... As Needed 3)  Flonase 50 Mcg/act  Susp (Fluticasone Propionate) .... As Needed 4)  Symbicort 160-4.5 Mcg/act Aero (Budesonide-Formoterol Fumarate) .... 2 Puffs Two Times A Day As Needed 5)  Albuterol Sulfate (2.5 Mg/32ml) 0.083%  Nebu (Albuterol Sulfate) .Marland Kitchen.. 1 in Nebulizer Once Daily As Needed 6)  Furosemide 20 Mg  Tabs (Furosemide) .Marland Kitchen.. 1 By Mouth Once Daily As Needed 7)  Omeprazole 20 Mg  Cpdr (Omeprazole) .Marland Kitchen.. 1 By Mouth Once Daily 8)  Ventolin Hfa 108 (90 Base) Mcg/act Aers (Albuterol Sulfate) .... 2 Puffs Q 6 Hours As Needed Shortness of Breath 9)  Med For Sleep .... 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 Eyes:  Denies blurring, irritation, and discharge. ENT:  Denies sore throat, hoarseness, and difficulty swallowing. CV:  Denies chest pains and dyspnea on exertion. Resp:  Denies dyspnea at rest, cough, and wheezing. GI:  Complains of nausea; denies difficulty swallowing, pain on swallowing, abdominal pain, diarrhea, and constipation. GU:  Denies urinary burning, blood in urine, and urinary frequency. MS:  Denies joint pain / LOM, joint swelling, and joint stiffness. Derm:  Denies rash, itching, and dry skin. Neuro:  Denies weakness and syncope. Psych:  Denies depression and anxiety.  Vital Signs:  Patient profile:   70 year old female Height:      61 inches Weight:      227 pounds BMI:     43.05 Temp:     98.6 degrees F oral Pulse rate:   76 / minute BP sitting:   118 / 80  (left arm) Cuff size:   large  Vitals Entered By: Cloria Spring LPN (February 09, 2010 2:33 PM)  Physical Exam  General:  Well developed, well nourished, no acute distress.obese.   Eyes:  non-icteric Mouth:  No deformity or lesions, dentition normal. Lungs:  Clear throughout to auscultation. Heart:  Regular rate and rhythm; no murmurs, rubs,  or bruits. Abdomen:  normal bowel sounds, obese, without guarding, without rebound, no distesion, no  tenderness, no masses, and no hepatomegally or splenomegaly.   Msk:  Symmetrical with no gross deformities. Normal posture. Extremities:  No clubbing, cyanosis, edema or deformities noted. Neurologic:  Alert and  oriented x4;  grossly normal neurologically. Psych:  Alert and cooperative. Normal mood and affect.  Impression & Recommendations:  Problem # 1:  TRANSAMINASES, SERUM, ELEVATED (ICD-790.4) [name] is a pleasant 70 year old female with a significant increase in LFTs documented in August. Denies any other symptoms to include N/V, abdominal pain, yellowing of skin, dark urine or light-colord stools, no other risk factors.   Repeat LFTs [place] of abdomen Review labs, further work-up as indicated  Orders: T-Hepatic Function ([id number]) Est. Patient Level III (47829)  Problem # 2:  DIABETES MELLITUS, TYPE II (ICD-250.00) Severe hyperglycemia noted in August, with glucose > 500. Pt reports only oral agent. Instructed to follow-up with PCP for mgt.   Appended Document: ABNORMAL LFT'S/LAW Differential includes: viral hepatitis, autoimmune hepatitis. eeds to be asked about OTC meds or new meds added since AUG 2011. Agree with abd u/S. Needs Acute viral hepatitis panel, quantitative immunoglobulins, anti-smooth muscle Ab, ANA. 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 She needs to lose 40 lbs and follow a low fat diet. Ask pt to pick up HO.  Appended Document: ABNORMAL LFT'S/LAW Pt informed. Diet in the mail.

## 2010-05-05 NOTE — Assessment & Plan Note (Signed)
Summary: ELEVATED LIVER ENZYMES   Visit Type:  Follow-up Visit Primary Care Provider:  [name], M.D.  Chief Complaint:  discuss options.  History of Present Illness: Has a right rotator cuff tear and going to see Dr. Romeo Apple. Having episode on standing up, breaking out on a sweat, legs feeling weak, and like she's gonna pass out. No CP or SOB. Feels heart racing. Sometimes when she checks her sugars, her sugar is slow. Take two DM pills. Sugars has been doing better. Been doing good with her diet. Fixing smoothies: Kiwi, carrots, and apple. Reflux doing good. One episode of vomiting in 2011.   Current Medications (verified): 1)  Reglan 5 Mg  Tabs (Metoclopramide Hcl) .Marland Kitchen.. 1 By Mouth Before Meals and At Bedtime 2)  Aleve 220 Mg  Tabs (Naproxen Sodium) .... As Needed 3)  Furosemide 20 Mg  Tabs (Furosemide) .Marland Kitchen.. 1 By Mouth Once Daily As Needed 4)  Omeprazole 20 Mg  Cpdr (Omeprazole) .Marland Kitchen.. 1 By Mouth Once Daily 5)  Med For Sleep .... As Directed At Bedtime 6)  Metformin Hcl 500 Mg Tabs (Metformin Hcl) .... 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 Head:  Normocephalic and atraumatic. Eyes:  PERRL, no icterus. Mouth:  No deformity or lesions. Neck:  Supple; no masses. Lungs:  Clear throughout to auscultation. Heart:  Regular rate and rhythm; no murmurs. Abdomen:  Soft, nontender and nondistended. Normal bowel sounds. Extremities:  No edema noted. Neurologic:  Alert and  oriented x4;  grossly normal neurologically.  Impression & Recommendations:  Problem # 1:  TRANSAMINASES, SERUM, ELEVATED (ICD-790.4) Assessment Unchanged Explained pt most likely has NASH and needs to lose weight. Explained she is morbidly obese. She should start with a  10-20 lbs weight loss. Offered liver biopsy for definitive diagnosis. Pt declined. Would like to try conservative measures. Low fat diet. HO given. OPV in 4-6 mos.   Orders: Est. Patient Level III (16109)  Appended Document: ELEVATED LIVER ENZYMES F/U OPV 4-6 MON IS IN THE COMPUTER

## 2010-05-05 NOTE — Letter (Signed)
Summary: Recall Office Visit  [facility]  [address]   [place], [place] [address]   Phone: [phone]  Fax: [phone]      October 12, 2009   [name] [address] [place], [place]  [address] 12-Feb-1941   Dear Ms. Atkerson,   According to our records, it is time for you to schedule a follow-up office visit with [place].   At your convenience, please call [phone] to schedule an office visit. If you have any questions, concerns, or feel that this letter is in error, we would appreciate your call.   Sincerely,    Hendricks Limes LPN  [facility] Ph: [phone]   Fax: [phone]

## 2010-05-05 NOTE — Letter (Signed)
Summary: HISTORY FORM   HISTORY FORM   Imported By: Eugenio Hoes 03/23/2010 15:41:34  _____________________________________________________________________  External Attachment:    Type:   Image     Comment:   External Document

## 2010-05-05 NOTE — Progress Notes (Signed)
Summary: ESI referral.  Phone Note Outgoing Call   Call placed by: Waldon Reining,  April 07, 2009 4:29 PM Call placed to: Specialist Action Taken: Information Sent Summary of Call: I faxed over a referral for this patient to Dr. Eduard Clos for ESI's of L-spine at L4-5.

## 2010-05-06 ENCOUNTER — Encounter (HOSPITAL_COMMUNITY)
Admission: RE | Admit: 2010-05-06 | Discharge: 2010-05-06 | Disposition: A | Discharge status: Home / Self Care | Payer: Medicare Other | Source: Ambulatory Visit | Attending: Orthopedic Surgery | Admitting: Orthopedic Surgery

## 2010-05-06 DIAGNOSIS — M75120 Complete rotator cuff tear or rupture of unspecified shoulder, not specified as traumatic: Secondary | ICD-10-CM | POA: Insufficient documentation

## 2010-05-06 DIAGNOSIS — M25519 Pain in unspecified shoulder: Secondary | ICD-10-CM | POA: Insufficient documentation

## 2010-05-06 DIAGNOSIS — M6281 Muscle weakness (generalized): Secondary | ICD-10-CM | POA: Insufficient documentation

## 2010-05-06 DIAGNOSIS — IMO0001 Reserved for inherently not codable concepts without codable children: Secondary | ICD-10-CM | POA: Insufficient documentation

## 2010-05-09 ENCOUNTER — Ambulatory Visit (HOSPITAL_COMMUNITY)
Admission: RE | Admit: 2010-05-09 | Discharge: 2010-05-09 | Disposition: A | Discharge status: Home / Self Care | Payer: Medicare Other | Source: Ambulatory Visit | Attending: Orthopedic Surgery | Admitting: Orthopedic Surgery

## 2010-05-09 DIAGNOSIS — E119 Type 2 diabetes mellitus without complications: Secondary | ICD-10-CM | POA: Insufficient documentation

## 2010-05-09 DIAGNOSIS — M6281 Muscle weakness (generalized): Secondary | ICD-10-CM | POA: Insufficient documentation

## 2010-05-09 DIAGNOSIS — M25619 Stiffness of unspecified shoulder, not elsewhere classified: Secondary | ICD-10-CM | POA: Insufficient documentation

## 2010-05-09 DIAGNOSIS — M25519 Pain in unspecified shoulder: Secondary | ICD-10-CM | POA: Insufficient documentation

## 2010-05-09 DIAGNOSIS — IMO0001 Reserved for inherently not codable concepts without codable children: Secondary | ICD-10-CM | POA: Insufficient documentation

## 2010-05-11 ENCOUNTER — Ambulatory Visit (HOSPITAL_COMMUNITY)
Admission: RE | Admit: 2010-05-11 | Discharge: 2010-05-11 | Disposition: A | Discharge status: Home / Self Care | Payer: Medicare Other | Source: Ambulatory Visit | Attending: Orthopedic Surgery | Admitting: Orthopedic Surgery

## 2010-05-11 ENCOUNTER — Encounter: Payer: Self-pay | Admitting: Orthopedic Surgery

## 2010-05-11 DIAGNOSIS — IMO0001 Reserved for inherently not codable concepts without codable children: Secondary | ICD-10-CM | POA: Insufficient documentation

## 2010-05-11 DIAGNOSIS — M6281 Muscle weakness (generalized): Secondary | ICD-10-CM | POA: Insufficient documentation

## 2010-05-11 DIAGNOSIS — E119 Type 2 diabetes mellitus without complications: Secondary | ICD-10-CM | POA: Insufficient documentation

## 2010-05-11 DIAGNOSIS — M25619 Stiffness of unspecified shoulder, not elsewhere classified: Secondary | ICD-10-CM | POA: Insufficient documentation

## 2010-05-11 DIAGNOSIS — M25519 Pain in unspecified shoulder: Secondary | ICD-10-CM | POA: Insufficient documentation

## 2010-05-13 ENCOUNTER — Ambulatory Visit (HOSPITAL_COMMUNITY)
Admission: RE | Admit: 2010-05-13 | Discharge: 2010-05-13 | Disposition: A | Discharge status: Home / Self Care | Payer: Medicare Other | Source: Ambulatory Visit | Attending: Orthopedic Surgery | Admitting: Orthopedic Surgery

## 2010-05-13 DIAGNOSIS — IMO0001 Reserved for inherently not codable concepts without codable children: Secondary | ICD-10-CM | POA: Insufficient documentation

## 2010-05-13 DIAGNOSIS — M25519 Pain in unspecified shoulder: Secondary | ICD-10-CM | POA: Insufficient documentation

## 2010-05-13 DIAGNOSIS — E119 Type 2 diabetes mellitus without complications: Secondary | ICD-10-CM | POA: Insufficient documentation

## 2010-05-13 DIAGNOSIS — M25619 Stiffness of unspecified shoulder, not elsewhere classified: Secondary | ICD-10-CM | POA: Insufficient documentation

## 2010-05-13 DIAGNOSIS — M6281 Muscle weakness (generalized): Secondary | ICD-10-CM | POA: Insufficient documentation

## 2010-05-16 ENCOUNTER — Ambulatory Visit (HOSPITAL_COMMUNITY)
Admission: RE | Admit: 2010-05-16 | Discharge: 2010-05-16 | Disposition: A | Discharge status: Home / Self Care | Payer: Medicare Other | Source: Ambulatory Visit | Attending: Orthopedic Surgery | Admitting: Orthopedic Surgery

## 2010-05-16 DIAGNOSIS — X500XXA Overexertion from strenuous movement or load, initial encounter: Secondary | ICD-10-CM | POA: Insufficient documentation

## 2010-05-16 DIAGNOSIS — Y998 Other external cause status: Secondary | ICD-10-CM | POA: Insufficient documentation

## 2010-05-16 DIAGNOSIS — IMO0001 Reserved for inherently not codable concepts without codable children: Secondary | ICD-10-CM | POA: Insufficient documentation

## 2010-05-16 DIAGNOSIS — S43429A Sprain of unspecified rotator cuff capsule, initial encounter: Secondary | ICD-10-CM | POA: Insufficient documentation

## 2010-05-16 DIAGNOSIS — Y93B9 Activity, other involving muscle strengthening exercises: Secondary | ICD-10-CM | POA: Insufficient documentation

## 2010-05-16 DIAGNOSIS — Y9229 Other specified public building as the place of occurrence of the external cause: Secondary | ICD-10-CM | POA: Insufficient documentation

## 2010-05-16 DIAGNOSIS — M6281 Muscle weakness (generalized): Secondary | ICD-10-CM | POA: Insufficient documentation

## 2010-05-16 DIAGNOSIS — M25519 Pain in unspecified shoulder: Secondary | ICD-10-CM | POA: Insufficient documentation

## 2010-05-17 ENCOUNTER — Encounter: Payer: Self-pay | Admitting: Orthopedic Surgery

## 2010-05-18 ENCOUNTER — Encounter: Payer: Self-pay | Admitting: Orthopedic Surgery

## 2010-05-18 ENCOUNTER — Ambulatory Visit (HOSPITAL_COMMUNITY)
Admission: RE | Admit: 2010-05-18 | Discharge: 2010-05-18 | Disposition: A | Discharge status: Home / Self Care | Payer: Medicare Other | Source: Ambulatory Visit | Admitting: Specialist

## 2010-05-18 ENCOUNTER — Ambulatory Visit (INDEPENDENT_AMBULATORY_CARE_PROVIDER_SITE_OTHER): Payer: MEDICARE | Admitting: Orthopedic Surgery

## 2010-05-18 DIAGNOSIS — M75120 Complete rotator cuff tear or rupture of unspecified shoulder, not specified as traumatic: Secondary | ICD-10-CM

## 2010-05-19 NOTE — Miscellaneous (Signed)
Summary: OT clinical evaluation  OT clinical evaluation   Imported By: Jacklynn Ganong 05/11/2010 15:54:00  _____________________________________________________________________  External Attachment:    Type:   Image     Comment:   External Document

## 2010-05-20 ENCOUNTER — Ambulatory Visit (HOSPITAL_COMMUNITY)
Admission: RE | Admit: 2010-05-20 | Discharge: 2010-05-20 | Disposition: A | Discharge status: Home / Self Care | Payer: Medicare Other | Source: Ambulatory Visit | Attending: *Deleted | Admitting: *Deleted

## 2010-05-23 ENCOUNTER — Ambulatory Visit (HOSPITAL_COMMUNITY)
Admission: RE | Admit: 2010-05-23 | Discharge: 2010-05-23 | Disposition: A | Discharge status: Home / Self Care | Payer: Medicare Other | Source: Ambulatory Visit | Attending: *Deleted | Admitting: *Deleted

## 2010-05-25 ENCOUNTER — Ambulatory Visit (HOSPITAL_COMMUNITY)
Admission: RE | Admit: 2010-05-25 | Discharge: 2010-05-25 | Disposition: A | Discharge status: Home / Self Care | Payer: Medicare Other | Source: Ambulatory Visit | Attending: *Deleted | Admitting: *Deleted

## 2010-05-25 NOTE — Assessment & Plan Note (Signed)
Summary: 1 mth reck rt shoulder/uhc/caf   Visit Type:  Follow-up Referring Provider:  Dr. [name]  Primary Provider:  [name], M.D.  CC:  right shoulder pain.  History of Present Illness: Problem # 1:  RUPTURE ROTATOR CUFF (ICD-727.61) The MRI was done at [facility] and it was reviewed with the report  she has basically a partial articular surface type tear of the rotator cuff with some tendinosis and tendinopathy is mainly in the articular surface. There are some labral changes, which are degenerative. There is some a.c. joint degeneration as well.  I saw [name] in the office today for a 1 MONTH followup visit.  She is a 70 years old woman with the complaint of:  right shoulder pain   This 70 year old female was working out at [employer]. several weeks ago and was using one of the machines and felt a pop in her shoulder   Treatment: Patient Instructions: 1) Therapy, sling, and medication for pain, MRI RIGHT shoulder for rotator cuff tear Prescriptions: NORCO 5-325 MG TABS (HYDROCODONE-ACETAMINOPHEN) 1 ev 4 hours as needed for pain  #42 x 1  Meds:Actos, Diazepam, Doxepin, Lasix, Glimepiride, Metfromin, Omeprazole, Phenazopyridine, Pravastatin, Eye drops, Vitamin D2, Norco 5 for pain helps alot.  Complaints: She says that her shoulder feels alot better, but she still has pain and hard to do some movements.    Allergies: No Known Drug Allergies  Physical Exam  Additional Exam:   RIGHT shoulder.  Active range of motion, forward elevation scapular plane is 100. Passive range of motion of 120.  The drop test is normal.     Impression & Recommendations:  Problem # 1:  RUPTURE ROTATOR CUFF (ICD-727.61) Assessment Improved  partial tear, articular surface PASTAl, lesion, improving with therapy recommending continue therapy. Followup 4 weeks  Orders: Est. Patient Level II (29562)  Patient Instructions: 1)  Please schedule a follow-up appointment in 4 weeks. 2)  continue  therapy   Orders Added: 1)  Est. Patient Level II [13086]

## 2010-05-25 NOTE — Miscellaneous (Signed)
Summary: OT progress note  OT progress note   Imported By: Jacklynn Ganong 05/17/2010 12:25:59  _____________________________________________________________________  External Attachment:    Type:   Image     Comment:   External Document

## 2010-05-27 ENCOUNTER — Ambulatory Visit (HOSPITAL_COMMUNITY)
Admission: RE | Admit: 2010-05-27 | Discharge: 2010-05-27 | Disposition: A | Discharge status: Home / Self Care | Payer: Medicare Other | Source: Ambulatory Visit | Attending: *Deleted | Admitting: *Deleted

## 2010-05-30 ENCOUNTER — Ambulatory Visit (HOSPITAL_COMMUNITY)
Admission: RE | Admit: 2010-05-30 | Discharge: 2010-05-30 | Disposition: A | Discharge status: Home / Self Care | Payer: Medicare Other | Source: Ambulatory Visit | Admitting: Occupational Therapy

## 2010-06-01 ENCOUNTER — Ambulatory Visit (HOSPITAL_COMMUNITY)
Admission: RE | Admit: 2010-06-01 | Discharge: 2010-06-01 | Disposition: A | Discharge status: Home / Self Care | Payer: Medicare Other | Source: Ambulatory Visit | Attending: Orthopedic Surgery | Admitting: Orthopedic Surgery

## 2010-06-06 ENCOUNTER — Ambulatory Visit (HOSPITAL_COMMUNITY)
Admission: RE | Admit: 2010-06-06 | Discharge: 2010-06-06 | Disposition: A | Discharge status: Home / Self Care | Payer: Medicare Other | Source: Ambulatory Visit | Attending: Orthopedic Surgery | Admitting: Orthopedic Surgery

## 2010-06-06 DIAGNOSIS — E119 Type 2 diabetes mellitus without complications: Secondary | ICD-10-CM | POA: Insufficient documentation

## 2010-06-06 DIAGNOSIS — M25519 Pain in unspecified shoulder: Secondary | ICD-10-CM | POA: Insufficient documentation

## 2010-06-06 DIAGNOSIS — M6281 Muscle weakness (generalized): Secondary | ICD-10-CM | POA: Insufficient documentation

## 2010-06-06 DIAGNOSIS — M25619 Stiffness of unspecified shoulder, not elsewhere classified: Secondary | ICD-10-CM | POA: Insufficient documentation

## 2010-06-06 DIAGNOSIS — IMO0001 Reserved for inherently not codable concepts without codable children: Secondary | ICD-10-CM | POA: Insufficient documentation

## 2010-06-07 ENCOUNTER — Encounter: Payer: Self-pay | Admitting: Orthopedic Surgery

## 2010-06-13 ENCOUNTER — Encounter: Payer: Self-pay | Admitting: Orthopedic Surgery

## 2010-06-14 NOTE — Letter (Signed)
Summary: History form  History form   Imported By: Jacklynn Ganong 06/09/2010 16:55:40  _____________________________________________________________________  External Attachment:    Type:   Image     Comment:   External Document

## 2010-06-15 ENCOUNTER — Telehealth: Payer: Self-pay | Admitting: Orthopedic Surgery

## 2010-06-16 ENCOUNTER — Ambulatory Visit: Payer: MEDICARE | Admitting: Orthopedic Surgery

## 2010-06-16 LAB — DIFFERENTIAL
Basophils Absolute: 0 10*3/uL (ref 0.0–0.1)
Basophils Relative: 0 % (ref 0–1)
Monocytes Absolute: 0.3 10*3/uL (ref 0.1–1.0)
Neutro Abs: 7.8 10*3/uL — ABNORMAL HIGH (ref 1.7–7.7)

## 2010-06-16 LAB — BASIC METABOLIC PANEL
BUN: 19 mg/dL (ref 6–23)
Calcium: 8.4 mg/dL (ref 8.4–10.5)
Creatinine, Ser: 1.29 mg/dL — ABNORMAL HIGH (ref 0.4–1.2)
GFR calc non Af Amer: 41 mL/min — ABNORMAL LOW (ref >60)
Glucose, Bld: 521 mg/dL — ABNORMAL HIGH (ref 70–99)
Potassium: 5.5 mEq/L — ABNORMAL HIGH (ref 3.5–5.1)

## 2010-06-16 LAB — CBC
HCT: 35.9 % — ABNORMAL LOW (ref 36.0–46.0)
MCHC: 33.1 g/dL (ref 30.0–36.0)
Platelets: 210 10*3/uL (ref 150–400)
RDW: 16.7 % — ABNORMAL HIGH (ref 11.5–15.5)

## 2010-06-16 LAB — GLUCOSE, CAPILLARY
Glucose-Capillary: 351 mg/dL — ABNORMAL HIGH (ref 70–99)
Glucose-Capillary: 483 mg/dL — ABNORMAL HIGH (ref 70–99)

## 2010-06-20 LAB — COMPREHENSIVE METABOLIC PANEL
AST: 85 U/L — ABNORMAL HIGH (ref 0–37)
Albumin: 3.7 g/dL (ref 3.5–5.2)
BUN: 16 mg/dL (ref 6–23)
CO2: 26 mEq/L (ref 19–32)
Calcium: 8.9 mg/dL (ref 8.4–10.5)
Chloride: 100 mEq/L (ref 96–112)
Creatinine, Ser: 1.37 mg/dL — ABNORMAL HIGH (ref 0.4–1.2)
GFR calc Af Amer: 46 mL/min — ABNORMAL LOW (ref >60)
GFR calc non Af Amer: 38 mL/min — ABNORMAL LOW (ref >60)
Total Bilirubin: 0.7 mg/dL (ref 0.3–1.2)

## 2010-06-20 LAB — URINALYSIS, ROUTINE W REFLEX MICROSCOPIC
Bilirubin Urine: NEGATIVE
Ketones, ur: NEGATIVE mg/dL
Nitrite: POSITIVE — AB
Urobilinogen, UA: 0.2 mg/dL (ref 0.0–1.0)

## 2010-06-20 LAB — URINE CULTURE: Colony Count: >=100000

## 2010-06-20 LAB — GLUCOSE, CAPILLARY: Glucose-Capillary: 171 mg/dL — ABNORMAL HIGH (ref 70–99)

## 2010-06-21 NOTE — Progress Notes (Signed)
Summary: phone with patient appointment re-scheduled  Phone Note Outgoing Call   Call placed to: Patient Summary of Call: Patient re-scheduled her appointment for April, states originally requested April for shoulder re-check.  Done. Initial call taken by: Cammie Sickle,  June 15, 2010 11:34 AM

## 2010-06-24 LAB — HEMOGLOBIN AND HEMATOCRIT, BLOOD: HCT: 34.4 % — ABNORMAL LOW (ref 36.0–46.0)

## 2010-06-24 LAB — BASIC METABOLIC PANEL
Calcium: 9 mg/dL (ref 8.4–10.5)
GFR calc Af Amer: 45 mL/min — ABNORMAL LOW (ref >60)
GFR calc non Af Amer: 37 mL/min — ABNORMAL LOW (ref >60)
Glucose, Bld: 183 mg/dL — ABNORMAL HIGH (ref 70–99)
Potassium: 4.4 mEq/L (ref 3.5–5.1)
Sodium: 138 mEq/L (ref 135–145)

## 2010-06-24 LAB — GLUCOSE, CAPILLARY: Glucose-Capillary: 136 mg/dL — ABNORMAL HIGH (ref 70–99)

## 2010-07-13 ENCOUNTER — Ambulatory Visit (INDEPENDENT_AMBULATORY_CARE_PROVIDER_SITE_OTHER): Payer: MEDICARE | Admitting: Orthopedic Surgery

## 2010-07-13 DIAGNOSIS — M25511 Pain in right shoulder: Secondary | ICD-10-CM

## 2010-07-13 DIAGNOSIS — M25519 Pain in unspecified shoulder: Secondary | ICD-10-CM

## 2010-07-13 NOTE — Patient Instructions (Addendum)
Preop is at [facility] on Tuesday 07/19/10 at 12:45pm, take packet with you.  Surgery is on Friday 07/22/10   Post op 1 in our office on 07/25/10

## 2010-07-13 NOTE — Discharge Summary (Signed)
Separate identifiable x-ray report x-ray RIGHT shoulder   reason pain  AP and lateral RIGHT  There is a large acromion type III this does not appear to be a spur of the large hook or he minimal joint normal.  No fracture.  Impression normal RIGHT shoulder

## 2010-07-13 NOTE — Progress Notes (Signed)
Chief complaint pain, and loss of motion, RIGHT shoulder.  This is a 70 year old female, who was injured at the [facility]. Earlier in the year and was treated for a partially torn rotator cuff with therapy and Vicodin. She did well and then fell and since that time has lost motion in the RIGHT shoulder complains of pain and inability to perform activities of daily living and presents back to Discuss surgical treatment. The MRI showed showed a partial articular surface tear of the rotator cuff or tendinosis or some labral changes and a.c. Joint degeneration.  Review of systems pain radiates down to but not past the elbow.  Expanded examination.  RIGHT shoulder active range of motion abduction 30, passive range of motion 90. External rotation active 10, passive 40.  There is weakness of the supraspinatus tendon. Shoulder is stable.  Sensation in the RIGHT upper extremity. Normal. Skin is intact. Pulses, good  Based on the patient's new trauma. A 2nd x-ray was obtained.  The patient like to have surgery soon as possible.  Recommend open rotator cuff repair, takedown of the partially torn tendon, if needed, and we repaired through bone tunnels were with double row repair.  The patient is worried about her expenses. In terms of therapy. I told her we would not do the surgery. She does not agree to go to therapy afterwards. I advised her that she will have significant pain after the surgery.  She still wants to proceed.

## 2010-07-18 ENCOUNTER — Telehealth: Payer: Self-pay | Admitting: Orthopedic Surgery

## 2010-07-18 NOTE — Telephone Encounter (Signed)
[place] [employer] insurance [id number] regarding out-patient surgery scheduled 07/22/10 at [facility]. CPT [id number], [phone], ICD9 dx 727.61.  Per Clydie Braun, intake specialist, no pre-authorization required (for in-network providers).

## 2010-07-19 ENCOUNTER — Other Ambulatory Visit: Payer: Self-pay | Admitting: Orthopedic Surgery

## 2010-07-19 ENCOUNTER — Encounter: Payer: Self-pay | Admitting: Cardiology

## 2010-07-19 ENCOUNTER — Encounter (HOSPITAL_COMMUNITY): Payer: Medicare Other

## 2010-07-19 LAB — PROTIME-INR
INR: 1.01 (ref 0.00–1.49)
Prothrombin Time: 13.5 seconds (ref 11.6–15.2)

## 2010-07-19 LAB — BASIC METABOLIC PANEL
BUN: 15 mg/dL (ref 6–23)
CO2: 24 mEq/L (ref 19–32)
Calcium: 8.9 mg/dL (ref 8.4–10.5)
Glucose, Bld: 134 mg/dL — ABNORMAL HIGH (ref 70–99)
Sodium: 138 mEq/L (ref 135–145)

## 2010-07-19 LAB — SURGICAL PCR SCREEN: Staphylococcus aureus: POSITIVE — AB

## 2010-07-19 LAB — CBC
MCV: 92.7 fL (ref 78.0–100.0)
Platelets: 221 10*3/uL (ref 150–400)
RBC: 4.22 MIL/uL (ref 3.87–5.11)
RDW: 14.5 % (ref 11.5–15.5)
WBC: 6.2 10*3/uL (ref 4.0–10.5)

## 2010-07-19 LAB — DIFFERENTIAL
Basophils Absolute: 0 10*3/uL (ref 0.0–0.1)
Basophils Relative: 1 % (ref 0–1)
Eosinophils Absolute: 0.1 10*3/uL (ref 0.0–0.7)
Eosinophils Relative: 2 % (ref 0–5)
Neutrophils Relative %: 44 % (ref 43–77)

## 2010-07-19 LAB — APTT: aPTT: 29 seconds (ref 24–37)

## 2010-07-20 ENCOUNTER — Encounter: Payer: Self-pay | Admitting: Cardiology

## 2010-07-20 ENCOUNTER — Ambulatory Visit (INDEPENDENT_AMBULATORY_CARE_PROVIDER_SITE_OTHER): Payer: Medicare Other | Admitting: Cardiology

## 2010-07-20 ENCOUNTER — Ambulatory Visit (HOSPITAL_COMMUNITY)
Admission: RE | Admit: 2010-07-20 | Discharge: 2010-07-20 | Disposition: A | Discharge status: Home / Self Care | Payer: Medicare Other | Source: Ambulatory Visit | Attending: Cardiology | Admitting: Cardiology

## 2010-07-20 ENCOUNTER — Ambulatory Visit: Payer: MEDICARE | Admitting: Cardiology

## 2010-07-20 VITALS — BP 112/86 | HR 80 | Ht 61.0 in | Wt 228.0 lb

## 2010-07-20 DIAGNOSIS — E119 Type 2 diabetes mellitus without complications: Secondary | ICD-10-CM | POA: Insufficient documentation

## 2010-07-20 DIAGNOSIS — I10 Essential (primary) hypertension: Secondary | ICD-10-CM

## 2010-07-20 DIAGNOSIS — I519 Heart disease, unspecified: Secondary | ICD-10-CM

## 2010-07-20 DIAGNOSIS — Z01810 Encounter for preprocedural cardiovascular examination: Secondary | ICD-10-CM | POA: Insufficient documentation

## 2010-07-20 DIAGNOSIS — I4949 Other premature depolarization: Secondary | ICD-10-CM

## 2010-07-20 DIAGNOSIS — I493 Ventricular premature depolarization: Secondary | ICD-10-CM

## 2010-07-20 DIAGNOSIS — Z01818 Encounter for other preprocedural examination: Secondary | ICD-10-CM

## 2010-07-20 NOTE — Assessment & Plan Note (Signed)
Reportedly noted to some degree by recent EEG done with Dr. Ninetta Lights. I await final report for review. Patient is not aware of any sense of palpitations, has not had any exertional chest pain or syncope however. Previous cardiac evaluation was reassuring.

## 2010-07-20 NOTE — Assessment & Plan Note (Signed)
Blood pressure is well-controlled today. 

## 2010-07-20 NOTE — Assessment & Plan Note (Signed)
Patient anticipates right rotator cuff repair under general anesthesia this Friday with Dr. Romeo Apple. We willl obtain a 2-D echocardiogram to ensure that LVEF remains normal, and if so, it is unlikely that any further cardiac testing will be necessary. Prior cardiac catheterization in 2003 demonstrated normal coronary arteries, blood pressure is well controlled, and she is not reporting any exertional chest pain or unusual shortness of breath. PVCs noted by report, will be less of a concern if LVEF remains normal. I do not plan to initiate a beta blocker in light of her reported history of asthma.

## 2010-07-20 NOTE — Patient Instructions (Signed)
**Note De-identified [name] Obfuscation** Your physician has requested that you have an echocardiogram. Echocardiography is a painless test that uses sound waves to create images of your heart. It provides your doctor with information about the size and shape of your heart and how well your heart's chambers and valves are working. This procedure takes approximately one hour. There are no restrictions for this procedure.  Your physician recommends that you continue on your current medications as directed. Please refer to the Current Medication list given to you today.  Your physician recommends that you schedule a follow-up appointment in: as needed  

## 2010-07-20 NOTE — Progress Notes (Signed)
Clinical Summary [name] is a 70 y.o.female referred by Dr. [name] for a preoperative consultation. She states that she is undergoing right rotator cuff surgery under general l anesthesia this Friday with Dr. Romeo Apple.  Records indicate that the patient underwent an EEG with Dr. Ninetta Lights in [place] to assess for possible seizure disorder. She had apparently been having some type of "spells" although she denies to me any frank syncope or unusual dizziness recently. She was told that she had "PVCs" noted to some degree on the study, no obvious seizure activity. I do not have the formal report as yet.  Otherwise she reports no significant exertional chest pain, has NYHA class II dyspnea on exertion. She uses a stationary exercise machine at her home for exercise. She reports no problems with palpitations.  Record review finds documentation of normal coronary arteries at catheterization 2003.   No Known Allergies  Current outpatient prescriptions:ASTEPRO 0.15 % SOLN, as needed. , Disp: , Rfl: ;  azithromycin (ZITHROMAX) 250 MG tablet, Take 2 tablets by mouth on day 1, followed by 1 tablet by mouth daily for 4 days., Disp: , Rfl: ;  diazepam (VALIUM) 10 MG tablet, Take 10 mg by mouth every 6 (six) hours as needed.  , Disp: , Rfl: ;  docusate sodium (COLACE) 100 MG capsule, Take 100 mg by mouth as needed.  , Disp: , Rfl:  doxepin (SINEQUAN) 25 MG capsule, Take 25 mg by mouth as needed. , Disp: , Rfl: ;  furosemide (LASIX) 20 MG tablet, Take 20 mg by mouth as needed. , Disp: , Rfl: ;  glimepiride (AMARYL) 4 MG tablet, Take 4 mg by mouth 1 dose over 46 hours. , Disp: , Rfl: ;  HYDROcodone-acetaminophen (NORCO) 5-325 MG per tablet, Take 1 tablet by mouth every 4 (four) hours as needed.  , Disp: , Rfl:  metFORMIN (GLUCOPHAGE) 500 MG tablet, Take 500 mg by mouth 2 (two) times daily with a meal.  , Disp: , Rfl: ;  metoCLOPramide (REGLAN) 5 MG tablet, Take 5 mg by mouth at bedtime.  , Disp: , Rfl: ;  mupirocin  (BACTROBAN) 2 % ointment, , Disp: , Rfl: ;  naproxen sodium (ALEVE) 220 MG tablet, Take 220 mg by mouth as needed.  , Disp: , Rfl: ;  NASONEX 50 MCG/ACT nasal spray, 2 sprays by Nasal route daily. , Disp: , Rfl:  nitrofurantoin (MACRODANTIN) 100 MG capsule, Take 100 mg by mouth daily. , Disp: , Rfl: ;  omeprazole (PRILOSEC) 20 MG capsule, Take 20 mg by mouth 2 (two) times daily. , Disp: , Rfl: ;  phenazopyridine (PYRIDIUM) 200 MG tablet, Take 200 mg by mouth 3 (three) times daily as needed.  , Disp: , Rfl:   Past Medical History  Diagnosis Date  . Asthma   . Depression   . Diabetes mellitus, type 2   . GERD (gastroesophageal reflux disease)   . Hypertension   . Low back pain   . IBS (irritable bowel syndrome)   . History of recurrent UTIs   . Cataract   . Glaucoma   . Arthritis   . Hx of migraines   . Carpal tunnel syndrome   . Morbid obesity   . Sleep apnea     Intolerant to CPAP   . Hx of colonic polyps   . Anxiety   . Carotid artery aneurysm     Right s/p endovascular treatment 2004  . Neurogenic bladder   . Stroke     Past  Surgical History  Procedure Date  . Vesicovaginal fistula closure w/ tah   . Carpal tunnel release - bilateral 1992  . Total hip replacement - right 2002  . Stomach surgery gastropexy for gastric volvulus 2009    Family History  Problem Relation Age of Onset  . Heart disease Mother   . Cancer Mother   . Diabetes Brother   . Cancer Brother     Prostate   . Cancer Brother     Pancreatic   . Cancer Sister     Breast   . Heart attack Sister     Social History [name] reports that she has quit smoking. Her smoking use included Cigarettes. She has never used smokeless tobacco. [name] reports that she does not drink alcohol.  Review of Systems As outlined above, otherwise reviewed and negative.  Physical Examination Filed Vitals:   07/20/10 1116  BP: 112/86  Pulse: 80  Obese woman in no acute distress. HEENT: Conjunctiva and  lids normal, oropharynx with moist mucosa. Neck: Supple, no elevated JVP or bruits, no thyromegaly. Lungs: Clear to auscultation, nonlabored. Cardiac: Regular rate and rhythm, no S3 gallop. Abdomen: Soft, nontender, bowel sounds present. Skin: Warm and dry. Musculoskeletal: No kyphosis, limited range of motion right shoulder. Extremities: No pitting edema, distal pulses 1-2+. Neuropsychiatric: Alert and oriented x3, affect appropriate.   ECG Normal sinus rhythm with nonspecific T-wave changes.  Studies Cardiac catheterization 03/19/2002: FINDINGS:  1. LV:  132/12/19 after the administration of contrast.  Ejection fraction     of approximately 65% without regional wall motion abnormality.  2. No mitral regurgitation or aortic stenosis.  3. Left main coronary artery:  Angiographically normal.  4. LAD:  A large vessel giving rise to a single diagonal branch.  There are     mild luminal irregularities.  5. Ramus intermedius:  A large vessel which is angiographically normal.  6. Circumflex:  A large vessel giving rise to two obtuse marginal branches.     It is angiographically normal.  7. RCA:  A moderate sized dominant vessel. It is angiographically normal.  Problem List and Plan

## 2010-07-22 ENCOUNTER — Encounter: Payer: Self-pay | Admitting: Orthopedic Surgery

## 2010-07-22 ENCOUNTER — Observation Stay (HOSPITAL_COMMUNITY)
Admission: RE | Admit: 2010-07-22 | Discharge: 2010-07-24 | Disposition: A | Discharge status: Home / Self Care | Payer: Medicare Other | Source: Ambulatory Visit | Attending: Orthopedic Surgery | Admitting: Orthopedic Surgery

## 2010-07-22 ENCOUNTER — Other Ambulatory Visit: Payer: Self-pay | Admitting: Orthopedic Surgery

## 2010-07-22 DIAGNOSIS — S43429A Sprain of unspecified rotator cuff capsule, initial encounter: Principal | ICD-10-CM | POA: Insufficient documentation

## 2010-07-22 DIAGNOSIS — Z01810 Encounter for preprocedural cardiovascular examination: Secondary | ICD-10-CM | POA: Insufficient documentation

## 2010-07-22 DIAGNOSIS — M75120 Complete rotator cuff tear or rupture of unspecified shoulder, not specified as traumatic: Secondary | ICD-10-CM

## 2010-07-22 DIAGNOSIS — Z01812 Encounter for preprocedural laboratory examination: Secondary | ICD-10-CM | POA: Insufficient documentation

## 2010-07-22 DIAGNOSIS — Z79899 Other long term (current) drug therapy: Secondary | ICD-10-CM | POA: Insufficient documentation

## 2010-07-22 DIAGNOSIS — R112 Nausea with vomiting, unspecified: Secondary | ICD-10-CM | POA: Insufficient documentation

## 2010-07-22 DIAGNOSIS — I10 Essential (primary) hypertension: Secondary | ICD-10-CM | POA: Insufficient documentation

## 2010-07-22 DIAGNOSIS — E119 Type 2 diabetes mellitus without complications: Secondary | ICD-10-CM | POA: Insufficient documentation

## 2010-07-22 DIAGNOSIS — R42 Dizziness and giddiness: Secondary | ICD-10-CM | POA: Insufficient documentation

## 2010-07-22 DIAGNOSIS — W19XXXA Unspecified fall, initial encounter: Secondary | ICD-10-CM | POA: Insufficient documentation

## 2010-07-22 DIAGNOSIS — G8918 Other acute postprocedural pain: Secondary | ICD-10-CM | POA: Insufficient documentation

## 2010-07-22 LAB — GLUCOSE, CAPILLARY: Glucose-Capillary: 156 mg/dL — ABNORMAL HIGH (ref 70–99)

## 2010-07-23 LAB — GLUCOSE, CAPILLARY
Glucose-Capillary: 181 mg/dL — ABNORMAL HIGH (ref 70–99)
Glucose-Capillary: 85 mg/dL (ref 70–99)

## 2010-07-24 LAB — GLUCOSE, CAPILLARY: Glucose-Capillary: 130 mg/dL — ABNORMAL HIGH (ref 70–99)

## 2010-07-25 ENCOUNTER — Ambulatory Visit: Payer: MEDICARE | Admitting: Orthopedic Surgery

## 2010-07-26 NOTE — H&P (Signed)
  [name], [name]               ACCOUNT NO.:  [id number]  MEDICAL RECORD NO.:  [id number]           PATIENT TYPE:  O  LOCATION:  CARDIOPU                      FACILITY:  [facility]  PHYSICIAN:  Vickki Hearing, M.D.DATE OF BIRTH:  [birth year]  DATE OF ADMISSION:  07/20/2010 DATE OF DISCHARGE:  04/18/2012LH                             HISTORY & PHYSICAL   CHIEF COMPLAINT:  Right shoulder pain.  This 70 year old female who was injured at the [facility] earlier in the year was treated for a partially torn rotator cuff with physical therapy and Vicodin.  She did well and then fell again and since that time has had loss of motion in the right shoulder and increased pain and inability to perform activities of daily living.  She presented back to the office to discuss surgical treatment.  She has already had an MRI which showed a partial articular surface tear of the rotator cuff with tendinosis and AC joint degeneration.  REVIEW OF SYSTEMS:  All systems were reviewed and were negative except for the musculoskeletal system.  Her medical, past family, social history, and review of systems continued as follows.  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  Currently not a smoker, was a smoker, does not use alcohol.  MEDICINES:  Zithromax, Valium, Colace, doxepin, furosemide, Amaryl, Vicodin, metformin, metoclopramide, Bactroban, Naprosyn, Nasonex, Macrodantin, Prilosec, and Pyridium.  PHYSICAL EXAMINATION:  VITAL SIGNS:  Stable. GENERAL APPEARANCE:  Normal with obesity. CARDIOVASCULAR:  Normal. NEUROLOGIC:  Normal. SKIN:  Normal. LYMPH  NODE:  Normal. LOWER EXTREMITY:  Normal.  Right shoulder active range of motion, abduction 30 degrees, passive 90 degrees.  External rotation active 10 degrees, passive 40 degrees. Distinct weakness of the supraspinatus tendon, but the shoulder remains stable.  Left upper extremity normal.  IMPRESSION:  Right shoulder rotator cuff tear.  PLAN:  Open right rotator cuff repair, takedown of partially torn tendon if needed, and repair through bone tunnels or double repair with push locks.  The patient understands the risk, benefits of surgery, and also understands and I made a specific plead to her that she has to undergo physical therapy or her shoulder will be stiff.     Vickki Hearing, M.D.     SEH/MEDQ  D:  07/21/2010  T:  07/21/2010  Job:  [id number]  cc:   [facility]  Electronically Signed by Fuller Canada M.D. on 07/26/2010 08:35:43 AM

## 2010-07-26 NOTE — Discharge Summary (Signed)
  [name], [place]               ACCOUNT NO.:  [id number]  MEDICAL RECORD NO.:  [id number]           PATIENT TYPE:  O  LOCATION:  A330                          FACILITY:  [facility]  PHYSICIAN:  Vickki Hearing, M.D.DATE OF BIRTH:  1940/09/10  DATE OF ADMISSION:  07/22/2010 DATE OF DISCHARGE:  04/22/2012LH                              DISCHARGE SUMMARY   She was admitted on July 22, 2010, as an outpatient with extended recovery secondary to nausea and vomiting.  ADMITTING DIAGNOSIS:  Rotator cuff tear, right shoulder.  DISCHARGE DIAGNOSIS:  Rotator cuff tear, right shoulder.  SECONDARY DIAGNOSES:  Nausea, vomiting, and poor pain control.  HISTORY:  This 70 year old female presented for open right rotator cuff repair which went uneventful.  She was about to be discharged and started having nausea, vomiting, and increased pain despite postop pain protocol.  She was admitted and given some IV pain medications, as well as oral, and treated for her nausea and vomiting with Zofran.  She did well and was kept until Sunday secondary to no therapy being available to help her out of bed.  I actually did the first out of bed to chair and she was dizzy, so she was given some fluid for that and did well.  On the postop day #2, her the wound was clean.  She was feeling good with minimal pain.  Her wound looked good and I allowed her to discharge.  She is going home.  Her condition is stable.  MEDICATIONS: 1. Furosemide 20 mg daily as needed. 2. Ibuprofen 800 mg q.8 h. 3. Norco 10 mg one to two tablets q.4-6 h. as needed, pain. 4. Phenergan 25 mg by mouth q.4 h. as needed, nausea. 5. Robaxin 500 mg q.6 h. p.r.n. muscle spasm or soreness. 6. Glyburide 2.5 mg daily. 7. Metformin 500 mg twice daily. 8. Metoclopramide 5 mg daily at bedtime. 9. Omeprazole 20 mg daily.  Her followup visit will be on Thursday for a postop visit and placement in a sling shot immobilizer.  Her therapy  will be held for 2 weeks.     Vickki Hearing, M.D.     SEH/MEDQ  D:  07/24/2010  T:  07/24/2010  Job:  [id number]  Electronically Signed by Fuller Canada M.D. on 07/26/2010 08:35:55 AM

## 2010-07-26 NOTE — Op Note (Signed)
[name], [name]               ACCOUNT NO.:  [id number]  MEDICAL RECORD NO.:  [id number]           PATIENT TYPE:  O  LOCATION:  A330                          FACILITY:  [facility]  PHYSICIAN:  Vickki Hearing, M.D.DATE OF BIRTH:  [birth year]  DATE OF PROCEDURE:  07/22/2010 DATE OF DISCHARGE:                              OPERATIVE REPORT   HISTORY:  This is a 70 year old female who presented with right shoulder pain.  She was treated nonoperatively.  She had a second injury.  She had increased pain.  Her MRI showed a partial articular surface tear of the rotator cuff with tendinosis, some AC joint degeneration, and some labral degeneration with a lateral sloping acromion.  We gave her options for continued nonoperative therapy versus surgery. She requested surgery.  She was told about the risks and benefits including the need to go to therapy which was a concern for her due to financial constraints.  This was well documented.  She agreed to have surgery.  PREOPERATIVE DIAGNOSES:  Rotator cuff tear, partial-thickness articular- surface tendon avulsion lesion right shoulder.  POSTOPERATIVE DIAGNOSES:  Rotator cuff tear, partial-thickness articular- surface tendon avulsion lesion right shoulder.  PROCEDURE:  Open acromioplasty and open rotator cuff repair.  SURGEON:  Vickki Hearing, MD.  ASSISTANT:  Valetta Close.  ANESTHESIA:  General.  OPERATIVE FINDINGS:  Large acromial spur, intact supraspinatus tendon, tendon degeneration, bursitis, AC joint degeneration.  The patient's acromion was sent to Pathology.  This was done under general anesthetic.  DETAILS OF PROCEDURE:  The patient was identified in the preop holding area, right arm was marked as the surgical site over the shoulder and countersigned by the surgeon.  The patient was taken to the operating room and given Ancef.  She had general anesthetic.  She was placed on a regular bed in the supine position and  then brought to the modified beach-chair position, after sterile prep and drape an incision was made between the 2 heads of the deltoid anterior and medial, subcutaneous tissue was divided.  Deltoid was split up to the acromion and then peeled in a Y-shaped fashion medially and laterally until the [facility] ligament was released.  The subdeltoid bursa was dissected and freed and removed.  The acromioplasty was performed.  The [facility] joint was debrided. The tendon of the supraspinatus was examined and taken from the greater tuberosity and 2 ArthroCare speed screw stitches were placed in the cuff.  The greater tuberosity was roughened, the bone was tapped, the anchors were placed and the repair was made using the speed screw per protocol.  This gave an excellent repair.  The wounds were irrigated and closed with interrupted suture for the deltoid repaired back to bone using #2 Ethibond.  We then used old Monocryl in one layer and 2-0 Monocryl in a subcu layer.  We injected 60 mL of Marcaine.  We placed a pain pump catheter.  We placed staples as well.  Sterile dressing was applied.  Pain pump was activated.  The patient was extubated and taken to recovery room in stable condition.  She has not had therapy order yet.  I have given her Norco 10 along with Robaxin and ibuprofen for pain relief.  She will follow up on Monday unless she has difficulty with pain control in the PACU.     Vickki Hearing, M.D.     SEH/MEDQ  D:  07/22/2010  T:  07/23/2010  Job:  [id number]  Electronically Signed by Fuller Canada M.D. on 07/26/2010 08:35:47 AM

## 2010-07-28 ENCOUNTER — Ambulatory Visit (INDEPENDENT_AMBULATORY_CARE_PROVIDER_SITE_OTHER): Payer: Medicare Other | Admitting: Orthopedic Surgery

## 2010-07-28 DIAGNOSIS — L298 Other pruritus: Secondary | ICD-10-CM

## 2010-07-28 DIAGNOSIS — L299 Pruritus, unspecified: Secondary | ICD-10-CM

## 2010-07-28 MED ORDER — DIPHENHYDRAMINE HCL 25 MG PO CAPS: 25.0000 mg | ORAL_CAPSULE | ORAL | Status: DC | PRN

## 2010-07-28 NOTE — Patient Instructions (Signed)
Wear sling ok to remove 3 x a day at your meals but dont move arm away from your body   You can straighten your arm while the sling is off

## 2010-07-28 NOTE — Progress Notes (Signed)
This is postop day #6 status post open rotator cuff repair and acromioplasty.  Patient noted to the hospital for pain control.  Primary complaint is itching.  Family new, medicines she's on his Robaxin, so I advise she stopped and start some Benadryl. She takes a Lasix tablet as needed and advised her to start that to help with her peripheral edema.  She has no shortness of breath. No chest pain.  Her incision looks good. The subcuticular stitch and was cut to,  She'll come back next week for staple removal and will order physical therapy at that time.  She was placed in a sling shot

## 2010-07-29 ENCOUNTER — Encounter: Payer: Self-pay | Admitting: Cardiology

## 2010-08-03 ENCOUNTER — Ambulatory Visit (INDEPENDENT_AMBULATORY_CARE_PROVIDER_SITE_OTHER): Payer: Medicare Other | Admitting: Orthopedic Surgery

## 2010-08-03 DIAGNOSIS — Z9889 Other specified postprocedural states: Secondary | ICD-10-CM

## 2010-08-03 NOTE — Patient Instructions (Signed)
Start PT   Take strips off next Wednesday   Wear sling 4 more weeks

## 2010-08-03 NOTE — Progress Notes (Signed)
Open cuff repair, RIGHT shoulder at the 20th 2012  Doing well except for some forearm pain. I've advised her to increase her time out of the sling.  Staples removed. Incision looks good. She can start physical therapy. Followup in 4 weeks

## 2010-08-08 NOTE — Progress Notes (Signed)
Addended by: Teressa Lower on: 08/08/2010 04:10 PM   Modules accepted: Orders

## 2010-08-11 ENCOUNTER — Ambulatory Visit (HOSPITAL_COMMUNITY)
Admission: RE | Admit: 2010-08-11 | Discharge: 2010-08-11 | Disposition: A | Discharge status: Home / Self Care | Payer: Medicare Other | Source: Ambulatory Visit | Attending: Physical Therapy | Admitting: Physical Therapy

## 2010-08-11 DIAGNOSIS — E119 Type 2 diabetes mellitus without complications: Secondary | ICD-10-CM | POA: Insufficient documentation

## 2010-08-11 DIAGNOSIS — M25519 Pain in unspecified shoulder: Secondary | ICD-10-CM | POA: Insufficient documentation

## 2010-08-11 DIAGNOSIS — IMO0001 Reserved for inherently not codable concepts without codable children: Secondary | ICD-10-CM | POA: Insufficient documentation

## 2010-08-11 DIAGNOSIS — M25619 Stiffness of unspecified shoulder, not elsewhere classified: Secondary | ICD-10-CM | POA: Insufficient documentation

## 2010-08-11 DIAGNOSIS — M6281 Muscle weakness (generalized): Secondary | ICD-10-CM | POA: Insufficient documentation

## 2010-08-16 NOTE — Assessment & Plan Note (Signed)
NAMEMarland Kitchen  [name], [name]                CHART#:  [id number]   DATE:  03/19/2007                       DOB:  [birth year]   REFERRING PHYSICIAN:  Patrica Duel, M.D.   PROBLEM LIST:  1. Nausea and vomiting secondary to gastroesophageal reflux disease.  2. 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  3. Diabetes.  4. Hypertension.  5. Asthma.  6. Anxiety.  7. Sleep apnea.  8. Arthritis.  9. Hysterectomy.   SUBJECTIVE:  [name] is a 70 year old who presents as a return  patient visit.  She stopped taking her Nexium and developed vomiting 2-3  times a day.  She has not been taking any Prilosec either.  She says she  cannot buy any more Nexium because she bottomed out on her insurance.  She has taken her Bentyl 20 mg twice a day.  She is having 4-5 watery  stools a day.  Her stress level is up since she has been sick.  She is  using her FiberCon on her food 3-4 times a day.  She uses Imodium 2  times a day as needed if she happens to have loose stool frequently.  She reports following dietary recommendations.  She was asked to follow  a low-fat, lactose-free, high-fiber diet.   MEDICATIONS:  1. Metoprolol.  2. Calcium with vitamin D.  3. Lisinopril.  4. Xanax.  5. Actos.  6. Advair.  7. Veramyst.  8. Beano.  9. Provera.  10.Bentyl.  11.Imodium.  12.FiberCon.  13.Bladder medicine.   PHYSICAL EXAMINATION:  VITAL SIGNS:  Weight 227 pounds (down 5 pounds  since September 2008), height 5 feet 1/2 in BMI 44.3 (morbidly obese)  Temperature 98.3, blood pressure 126/80, pulse 80.  GENERAL:  She is in  no apparent distress.  LUNGS:  Clear to auscultation bilaterally.  CARDIOVASCULAR:  Exam shows a regular rhythm.  Normal murmur.  Normal S1  and S2.  ABDOMEN:  Bowel sounds are present.  Soft, nontender and non-  distended.  No rebound or guarding.  Obese.   ASSESSMENT:  [name] is a 70 -year-old female who has not been  taking her therapy for her  gastroesophageal reflux disease.  I explained  to her there is no surprise that she has nausea and vomiting.  Her  diarrhea is likely secondary to irritable bowel or diabetic enteropathy.  Unfortunately, the only thing we can do about those 2 conditions is  manage them medically.   Thank you for allowing me to see [name] in consultation.  My  recommendations follow.   RECOMMENDATIONS:  1. She is given 20 Nexium samples and a new prescription to begin      January 2009.  2. She should increase her Bentyl to 20 mg q.a.c. and nightly. She was      given a new prescription.  3. She should continue a lactose-free high-fiber diet.  4. She may return to see me in 4 weeks.       Kassie Mends, M.D.  Electronically Signed     SM/MEDQ  D:  03/19/2007  T:  03/20/2007  Job:  [id number]   cc:   Patrica Duel, M.D.

## 2010-08-16 NOTE — Op Note (Signed)
[name], [name]               ACCOUNT NO.:  [id number]   MEDICAL RECORD NO.:  [id number]          PATIENT TYPE:  AMB   LOCATION:  DAY                           FACILITY:  [facility]   PHYSICIAN:  Dalia Heading, M.D.  DATE OF BIRTH:  October 05, 1940   DATE OF PROCEDURE:  08/28/2007  DATE OF DISCHARGE:                               OPERATIVE REPORT   PREOPERATIVE DIAGNOSIS:  Gastric volvulus, intermittent.   POSTOPERATIVE DIAGNOSIS:  Gastric volvulus, intermittent.   PROCEDURE:  Diagnostic laparoscopy, gastropexy.   SURGEON:  Dalia Heading, MD   ASSISTANT:  Tilford Pillar, MD   ANESTHESIA:  General endotracheal.   INDICATIONS:  The patient is a 70 year old black female who has  intermittent gastric volvulus.  This has been confirmed by upper GI  series.  The patient now comes to the operating room for a diagnostic  laparoscopy with gastropexy. The risks and benefits of the procedure  including bleeding, infection, and recurrence of the symptoms were fully  explained to the patient, gave informed consent.   PROCEDURE NOTE:  The patient was placed on a low lithotomy position  after induction of general endotracheal anesthesia.  The abdomen was  prepped and draped using usual sterile technique with Betadine.  Surgical site confirmation was performed.   A supraumbilical incision was made down to the fascia.  A Veress needle  was introduced into the abdominal cavity and confirmation of placement  was done using the saline drop test.  The abdomen was then insufflated  to 16 mmHg pressure.  An 11-mm trocar was then introduced into the  abdominal cavity under direct visualization without difficulty.  The  patient was placed in reversed Trendelenburg position and an additional  11-mm trocar was placed in the epigastric region and the left upper  quadrant region.  The stomach was visualized and noted to be elongated.  The patient has a known eventration of the left hemidiaphragm.  The  hiatus was inspected and there was no evidence of a significant hiatal  hernia.  The liver appeared to be within normal limits.  The stomach was  inspected and noted to be within normal limits.  It was noted be  elongated and it is felt that this is the reason for her intermittent  volvulus.  Four silk sutures were placed on the anterior wall from the  mid stomach down to the antrum.  These pexing sutures were then attached  to the anterior abdominal wall.  No evidence of bleeding or perforation  were noted at the end of the procedure.  On deflation of the abdomen,  the stomach was noted to be lying freely at the gastropexy sites without  tension.  All fluid and air were then evacuate from the abdominal cavity  prior to removal of the trocars.   All wounds were irrigated with normal saline.  All wounds were injected  with 0.5% Sensorcaine.  The supraumbilical fascia was reapproximated  using an 0 Vicryl and Rapide suture.  All skin incisions were closed  using staples or Dermabond.   All tape and needle counts were correct  at the end of the procedure.  The patient was extubated in the operating room and transferred to  recovery room awake in stable condition.   COMPLICATIONS:  None.   SPECIMEN:  None.   BLOOD LOSS:  Minimal.      Dalia Heading, M.D.  Electronically Signed     MAJ/MEDQ  D:  08/28/2007  T:  08/29/2007  Job:  [id number]   cc:   Kassie Mends, M.D.  [address]  [place] , [place] [address]   Erle Crocker, M.D.

## 2010-08-16 NOTE — Assessment & Plan Note (Signed)
NAMEMarland Kitchen  Shelby, [name]                CHART#:  [id number]   DATE:  09/06/2006                       DOB:  1940/07/27   REFERRING PHYSICIAN:  Patrica Duel, M.D.   PROBLEM LIST:  1. Nausea and vomiting secondary to gastroesophageal reflux disease.  2. 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  3. Diabetes.  4. Hypertension.  5. Asthma.  6. Anxiety.  7. Sleep apnea.  8. Arthritis.  9. Hysterectomy.   SUBJECTIVE:  [name] is a 70 year old female who presents as a  return patient visit.  She continues to complain of watery stool.  She  is having 4-5 bowel movements a day.  She denies any blood.  She  switched to soy milk and continues on a high-fiber diet.  She continues  to wear an adult brief in case she loses control of her bowels.  She has  abdominal pain prior to having her bowel movements, which is relieved  after she has a bowel movement.  She uses Bentyl, Kaopectate, and/or  Imodium prior to meals, which slows down the watery stool but does not  alleviate it.  She still has her gallbladder.  She is inquiring on  getting her Nexium.  I did fill out her pharmacy request form.  We have  a form here from [facility] that says she does not need prior authorization.  She denies any fever associated with her watery stool.   OBJECTIVE:  VITAL SIGNS:  Weight 231 pounds (up 2 pounds since April,  2008).  Height 5-1/2 inches.  Temperature 97.8, blood pressure 122/80.  Pulse 64.  GENERAL:  She is in no apparent distress.  Alert and oriented x4.  LUNGS:  Clear to auscultation bilaterally.  CARDIOVASCULAR:  Regular  rhythm.  No murmur.  Normal S1 and S2.  ABDOMEN:  Bowel sounds are  present.  Soft, nontender, nondistended.  No guarding or rebound.   ASSESSMENT:  [name] is a 70 year old female who has abdominal pain  and cramping, which is relieved by having watery stools.  The most  likely etiology of her symptoms is  irritable bowel syndrome, but the  differential diagnosis still includes diabetic enteropathy and small  bowel bacterial overgrowth.   Thank you for allowing me to see [name] in consultation.  My  recommendations follow.   RECOMMENDATIONS:  1. Will check B12 level.  Elevated B12 levels are associated with      bacterial overgrowth.  Her risk factors for bacterial overgrowth      include diabetic enteropathy.  2. She is given a prescription for Bentyl and asked to take 20 minutes      30 minutes before breakfast, lunch, and dinner.  3. She is given a prescription for Nexium and asked to take 40 mg      daily.  4. She is given a referral to go to [facility] to be evaluated for bacterial overgrowth.  5. She is also asked to obtain a 72-hour stool collection for fecal      fat and weight.  6. She has a follow-up appointment to see me in six weeks.  The      appointment will be scheduled on the same day as her  husband's.       Kassie Mends, M.D.  Electronically Signed     SM/MEDQ  D:  09/06/2006  T:  09/06/2006  Job:  [id number]   cc:   Patrica Duel, M.D.

## 2010-08-16 NOTE — Procedures (Signed)
[name], [name]               ACCOUNT NO.:  [id number]   MEDICAL RECORD NO.:  [id number]          PATIENT TYPE:  REC   LOCATION:  REH                           FACILITY:  [facility]   PHYSICIAN:  Erick Colace, M.D.DATE OF BIRTH:  11-11-1940   DATE OF PROCEDURE:  08/21/2006  DATE OF DISCHARGE:                               OPERATIVE REPORT   PROCEDURE:  L5-S1 translaminar lumbar epidural steroid injection with  fluoroscopic guidance.   INDICATIONS:  Lumbar radiculitis due to spinal stenosis.   Pain is only partially responsive to medication management and other  conservative care.  Overall she has done quite well after the previous  epidural injection 6 weeks ago and is able to assess his pain well in  physical therapy.   Informed consent was obtained after describing risks and benefits of  procedure to the patient.  These include bleeding, bruising, infection,  loss of bowel or bladder function, temporary or permanent paralysis.  She elects to proceed and has given written consent.   DESCRIPTION OF PROCEDURE:  The patient placed prone on the fluoroscopy  table.  Betadine prep, sterile drape.  25 gauge inch and half needle was  used to anesthetize skin and subcu tissue with 1% lidocaine x2 mL. Then  18 gauge Hustead needle was inserted under fluoroscopic guidance,  inserted into the L5-S1 interlaminar space tight paramedian approach,  loss of resistance technique utilized obtained.  Omnipaque 180 under  live fluoro demonstrated no intravascular uptake, good epidural spread  followed by injection of the solution containing 2 mL of 40 mg/mL Depo-  Medrol and 2 mL of 1% MPF lidocaine.  The patient tolerated the  procedure well.  She is driven home by her husband and will follow up  with Dr. Pamelia Hoit.      Erick Colace, M.D.  Electronically Signed     AEK/MEDQ  D:  08/21/2006 12:43:03  T:  08/21/2006 13:00:23  Job:  [id number]   cc:   Brantley Stage, M.D.  Fax: [phone]

## 2010-08-16 NOTE — Assessment & Plan Note (Signed)
NAMERASHEEN, SCHEWE                CHART#:  [id number]   DATE:  07/26/2007                       DOB:  [birth year]   CHIEF COMPLAINT:  Nausea and vomiting, intractable.   SUBJECTIVE:  Ms. Hodgkin is here for a work-in visit.  She had an  appointment with Dr. Cira Servant October 16, 2007, but she wanted to be seen  sooner.  She has a long history of intermittent nausea and vomiting,  apparently felt to be secondary to gastroesophageal reflux disease.  This has been going on for about two years.  She states generally she  has three or four days a week when she is vomiting.  However, over the  past two weeks, she has had persistent vomiting and is only able to eat  Jello.  She called in on July 18, 2007.  We asked her to increase her  Nexium, but she said she was not taking it anymore and we were not given  any indication what she was taking for reflux.  I did start her on  Carafate and promethazine suppositories.  Today is the first day she has  not vomited.  She denies any abdominal pain; however, after several  episodes of vomiting, she starts having a burning discomfort in her  epigastrium.  She denies any hematemesis.  She states her bowel  movements are better than they have every been.  She does not have  diarrhea anymore.  She is having regular bowel movements which are  formed.  She contributes this to fiber supplement.  She denies any blood  in the stool or melena.  She states she is on a pill for reflux, but she  cannot remember the name.  On review of the electronic medical record,  she had a gastric emptying study February 2009 which was normal.  She  had a CT of the head as well as MRI because of her history of aneurysm  and nausea and vomiting.  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  She had a CT of the abdomen and pelvis earlier this year  which revealed mild gastric distention and moderate  stool.   CURRENT MEDICATIONS:  See updated listed.   ALLERGIES:  NO KNOWN DRUG ALLERGIES.   PHYSICAL EXAMINATION:  VITAL SIGNS:  Weight 228 which is stable from her  last office visit in January 2009.  Temperature 98.1, blood pressure  128/78, pulse 72.  GENERAL:  Pleasant, obese female in no acute distress.  SKIN:  Warm and dry.  No jaundice.  HEENT:  Sclera nonicteric.  Oropharyngeal mucosa moist and pink.  ABDOMEN:  Positive bowel sounds.  Abdomen soft, nontender and  nondistended.  No organomegaly or masses.  No guarding or rebound.   IMPRESSION:  Ms. Jastrzebski is a 70 year old lady with intermittent nausea  and vomiting which has been persistent for a two-week period.  It is not  clear whether or not she is on a PPI at this time, given she has not  brought all her medications.  She describes having a very large hiatal  hernia.  This could easily be contributing to her vomiting.  She reports  last EGD about one to two years ago with Dr. Marina Goodell and I am trying to  retrieve that record at this time.   PLAN:  1. Upper GI series.  2. She will call with complete list of medications.  3. Retrieve EGD report from Dr. Lamar Sprinkles office.  4. Continue Carafate and Phenergan as needed.  5. Further recommendations to follow.   Dr. Jena Gauss is cosigner in Dr. Ulyses Southward absence.       Tana Coast, P.A.  Electronically Signed     LL/MEDQ  D:  07/26/2007  T:  07/26/2007  Job:  [id number]   cc:   Erle Crocker, M.D.

## 2010-08-16 NOTE — Assessment & Plan Note (Signed)
[name] is a 70 year old African-American female who is being seen  in our pain and rehabilitative clinic for low back pain and leg pain.   She is known to have lumbar spinal stenosis/spondylosis and has had  intermittent right leg pain.  She underwent a lumbar epidural steroid  injection in May and has done very well for the last 4 months.  She has  not returned to clinic because her pain has been down so low.  She has  been very active and has been traveling up to [place] and been able to  go shopping and states that she has not been able to walk this far in  several years.  She was quite pleased with the results of the epidural  steroid injection; however, over the last couple of weeks she has had a  return of the pain to her right leg.  Currently it is located in the  posterior calf region and the lateral ankle, slowly creeping up to a  higher pain level again.  She states it is a deep ache and is a 9 on a  scale of 10, and it is limiting her activity levels at this point.  Again, it is typically worse at night and when she is up and active.  Standing and walking, bending seems to exacerbate it.  Rest and the  medication does help.  She is taking about 2 Tylenol Extra-Strength a  day to help her with her pain, but it is not decreasing the intensity  dramatically.   She can walk a few minutes at a time right now.  She does not climb  stairs.  She is able to drive.  She is independent with feeding and  toileting.  Needs some assistance with dressing, daily meals and  household duties.   Health and history forms are reviewed with respect to review of systems.  No changes in past medical, social or family history since last visit.   On exam today her blood pressure is 117/69, pulse 65, respirations 18,  98% saturated on room air.  She is a mildly obese African-American  female who does not appear in any distress.  She does appear her stated  age.   She is oriented x3.  Speech  is clear.  Affect is bright, alert.  She is  cooperative and pleasant.   Transitioning from sit to stand she has a slightly antalgic gait.  Decreased weightbearing on right lower extremity noted.  Forward flexion  and extension do not exacerbate significantly with respect to the lumbar  spine.   Reflexes are diminished in lower extremities.  No abnormal tone is  noted.  She has good strength, and hip flexors, knee extensors,  dorsiflexors, plantar flexors and EHL .  She is noted to have decreased  sensation, especially in the right lower extremity and the L5-S1  dermatome.   IMPRESSION:  1. Lumbar stenosis/lumbago with decreased ability to ambulate for a      prolonged period of time.  2. A history of facet arthropathy.  3. Right total hip replacement.  4. 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   PLAN:  Other medical problems include diabetes, hypertension, asthma,  irritable bowel, panic attack, status post cerebral aneurysm 2002.  She  has a history of bladder incontinence and does use Depends.   She is currently not taking any medications from our clinic.  She has  undergone the epidural injections approximately 4 months ago and  obtained significant  relief with this.  She is now 4 months status post  this injection, and she is currently requesting a new injection.   PLAN:  We will write her a prescription for Ultram 1 p.o. twice a day  p.r.n. leg pain #50 no refills, and we will get her set up for a repeat  epidural steroid injection.  We will also get her set up for a balance  program.  During physical  exam it was noted she has some difficulty with tandem gait.  She states  that she does feel sometimes a little unsteady when she is walking at  home.  We will see her back in a month.           ______________________________  Brantley Stage, M.D.     DMK/MedQ  D:  12/12/2006 13:07:34  T:  12/13/2006 10:50:22  Job #:  [id number]

## 2010-08-16 NOTE — H&P (Signed)
[name], MCGAHAN               ACCOUNT NO.:  [id number]   MEDICAL RECORD NO.:  [id number]          PATIENT TYPE:  AMB   LOCATION:  DAY                           FACILITY:  [facility]   PHYSICIAN:  Dalia Heading, M.D.  DATE OF BIRTH:  [birth year]   DATE OF ADMISSION:  DATE OF DISCHARGE:  LH                              HISTORY & PHYSICAL   CHIEF COMPLAINT:  Intermittent gastric volvulus.   HISTORY OF PRESENT ILLNESS:  The patient is a 70 year old black female  who is referred for evaluation and treatment of intermittent gastric  volvulus.  She has been having intermittent epigastric pain and vomiting  for years.  After an extensive workup, she was found to have  intermittent gastric volvulus and eventration of the left hemidiaphragm.   PAST MEDICAL HISTORY:  1. Hypertension.  2. Reflux disease.  3. Recent urinary tract infection.  4. Non-insulin-dependent diabetes mellitus.  5. COPD.   PAST SURGICAL HISTORY:  1. Hysterectomy.  2. Tubal ligation.  3. Hip replacement.  4. Bladder tack up.   CURRENT MEDICATIONS:  Carafate, Phenergan suppositories, metoprolol,  calcium supplements, trazodone, Actos, Advair, Viracept, ProAir, Hyomax,  metoclopramide, and Bactrim.   ALLERGIES:  No known drug allergies.   REVIEW OF SYSTEMS:  The patient denies drinking or smoking.  She denies  any other cardiopulmonary difficulties or bleeding disorders.   PHYSICAL EXAMINATION:  GENERAL:  The patient is a well-developed, well-  nourished black female in no acute distress.  HEENT:  Reveals no scleral icterus.  LUNGS:  Clear to auscultation with equal breath sounds bilaterally.  HEART:  Reveals regular rate and rhythm without S3, S4, or murmurs.  ABDOMEN:  Soft, nontender, and nondistended.  No hepatosplenomegaly,  masses, or hernias are identified.   IMPRESSION:  Intermittent gastric volvulus.   PLAN:  The patient is scheduled for a diagnostic laparoscopy, gastropexy  on Aug 28, 2007.  The  risks and benefits of the procedure including  bleeding, infection, gastric injury, and the possibility of an open  procedure were fully explained to the patient, gave informed consent.      Dalia Heading, M.D.  Electronically Signed     MAJ/MEDQ  D:  08/22/2007  T:  08/23/2007  Job:  [id number]   cc:   Short Stay   Kassie Mends, M.D.  [address]  [place] , [place] [address]   Erle Crocker, M.D.

## 2010-08-16 NOTE — Op Note (Signed)
[name], POHLMAN               ACCOUNT NO.:  [id number]   MEDICAL RECORD NO.:  [id number]          PATIENT TYPE:  AMB   LOCATION:  DAY                           FACILITY:  [facility]   PHYSICIAN:  Kassie Mends, M.D.      DATE OF BIRTH:  [birth year]   DATE OF PROCEDURE:  08/12/2007  DATE OF DISCHARGE:                               OPERATIVE REPORT   PROCEDURE:  Esophagogastroduodenoscopy with cold forceps biopsy and  Bravo capsule placement.   INDICATION FOR EXAM:  [name] is a 70 year old female who continues  to complain of intractable vomiting.  She has significant past history  of gastroesophageal reflux disease and has required Nexium twice a day.  She had a gastric emptying study in February 2009 which was normal.  Her  upper GI study showed eventration of her fundus through the left  diaphragm and into the left chest, possibly causing gastric outlet  obstruction.  Her symptoms are episodic.  The upper and upper GI also  showed evidence of mild thickening of the duodenal bulb possibly  secondary to duodenitis.  The upper endoscopy is being done to evaluate  her uncontrolled nausea and vomiting.   FINDINGS:  1. Normal esophagus without evidence of Barrett's,  mass, erosion,      ulceration or stricture.  The GE junction was at 40 cm.  The Bravo      capsule was placed at 34 cm.  2. Unable to appreciate a hiatal hernia in the retroflexed view.  The      fundus does not appear to be eventrated through the diaphragm at      this time of exam.  She has multiple antral erosions and erythema.      Biopsies obtained via cold forceps to evaluate for H. pylori      gastritis.  3. Normal duodenal bulb and second portion of the duodenum.   DIAGNOSIS:  1. Chronic intermittent vomiting.  2. Gastritis, mild.   RECOMMENDATIONS:  1. She should continue PPI twice daily.  2. We will complete a Bravo study for 48 hours and then follow-up with      Dr. Franky Macho.  3. She may resume her  previous diet.  She is asked to consume a soft      diet with only chopped or ground meat and no raw vegetables.  She      was given a handout on soft diet at her last outpatient visit on      May 7.  4  Will call [name] with results of her biopsies.  1. She already has a follow-up appointment to see me in 2 months.  2. No aspirin, NSAIDs or anticoagulation for 5 days.  If she does have      definite evidence of H. pylori gastritis then will we will hold her      aspirin and NSAIDs for 30 days.  3. She is given a handout on gastritis.   MEDICATIONS:  1. Demerol 75 mg IV.  2. Versed 6 mg IV.  3. Hydralazine 10 mg IV.  PROCEDURE TECHNIQUE:  Physical exam was performed.  Informed consent was  obtained from the patient after explaining the benefits, risks and  alternatives to the procedure.  The patient was connected to the monitor  and placed in left lateral position.  Continuous oxygen was provided by  nasal cannula and IV medicine administered through an indwelling  cannula.  After administration of sedation, the patient's esophagus was  intubated and the scope was advanced under direct visualization to the  second portion of the duodenum.  The scope was withdrawn by carefully  examining the color, texture, anatomy and integrity of the mucosa on the  way out.  Prior to full withdrawal of the scope, the GE junction was  identified at 40 cm from the teeth.  The Bravo capsule was introduced  into the distal esophagus at 34 cm from the teeth.  The scope was  reintroduced into the esophagus, advanced to the Bravo capsule to  confirm placement on the wall of the esophagus.  The scope and the Bravo  introducer were withdrawn.  The patient was recovered in endoscopy and  discharged home in satisfactory condition.   PATH:  Mild gastritis.      Kassie Mends, M.D.  Electronically Signed     SM/MEDQ  D:  08/12/2007  T:  08/12/2007  Job:  [id number]   cc:   Erle Crocker, M.D.

## 2010-08-16 NOTE — Op Note (Signed)
[name], [name]               ACCOUNT NO.:  [id number]   MEDICAL RECORD NO.:  [id number]         PATIENT TYPE:  HAMB   LOCATION:                                FACILITY:  [facility]   PHYSICIAN:  Martina Sinner, MD DATE OF BIRTH:  11-11-1940   DATE OF PROCEDURE:  03/03/2008  DATE OF DISCHARGE:                               OPERATIVE REPORT   PREOPERATIVE DIAGNOSIS:  Neurogenic bladder, refractory urge  incontinence and muscle spasm.   POSTOPERATIVE DIAGNOSIS:  Neurogenic bladder, refractory urge  incontinence and muscle spasm.   SURGERY:  Cystoscopy, bladder hydrodistention, injection of Botox.   Ms. 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   Based upon her age and comorbidities, I did not feel that InterStim was  good option for her.   She has failed 4 anticholinergics.   She was prepped and draped in the usual fashion.   She was given preoperative gentamicin.  She is on Macrodantin to  suppress urinary tract infections.   The ACMI scope was utilized.  She had mild cystitis cystica.  The  bladder mucosa and trigone were normal.  There was no stitch, foreign  body or carcinoma.   I injected 200 units of Botox in 20 mL of saline using my usual template  of 5 and 7 o'clock and cephalad to the interureteric ridge.  There was  no bleeding.  The bladder was emptied.   The patient was taken to the recovery room.   Hopefully this procedure will reach her treatment goal.           ______________________________  Martina Sinner, MD  Electronically Signed     SAM/MEDQ  D:  03/03/2008  T:  03/03/2008  Job:  [id number]

## 2010-08-16 NOTE — Procedures (Signed)
[name] is a pleasant, married, 70 year old African American female  who was last seen in our [facility] on January 23, 2007.   She is being followed in our [facility] Clinic for chronic  low back and intermittent right leg pain.   She is back in today for a refill of her tramadol.  She takes 1 or 2  tramadol per day on a p.r.n. basis.   Her chief complaint today is that she has had several falls in the last  few weeks.  She states that she has fallen at home as well as at church.  She states she does have low back pain and predominantly right leg pain;  however, she believes she is getting a little swimmy-headed prior to  falling.  She does not recall any significant pain while she is walking  prior to the fall.  She does not really remember much about her falls.  She states she has not injured herself.  She has no new pain in her  legs, hips, back, shoulders or arms, head or neck.   Her average pain is about a 4 on a scale of 10, predominantly located in  the low back and right leg.  She does have some milder left leg pain.  Both of her legs, she states her pain comes and goes intermittently and  is worse with activity.   Pain typically improves with injections and medications as well as rest.  She has diminished walking capacity less than about 5 minutes.  She does  use a cane and sometimes a walker.  She has difficulty with stairs.  She  does not drive.  She is independent with her self-care.   Medications provided by our clinic include Ultram 1 p.o. up to twice a  day on a p.r.n. basis.   No other new medical problems were reported by the patient.  Social and  family histories are stable as well from the last visit.   Medical problems for [name] include diabetes, hypertension,  asthma, irritable bowel, panic attacks.  She  is status post cerebral  aneurysm in 2002 and has a history of bladder incontinence.   PHYSICAL  EXAMINATION:  VITAL SIGNS:  On exam today, her blood pressure  in sitting is 127/70, standing 98/59; pulse 59, respiration 18, 99%  saturated on room air.  GENERAL:  She is an obese [place] American female who does not appear in  any distress.  She is oriented x3.  Her speech is clear.  Her affect is  bright.  She is alert, cooperative, and pleasant, and she follows  commands without difficulty.  She is able to transition from sitting to  standing easily in the room today.  Her gait is nonantalgic.  She is  able to perform Tandem gait as well as Romberg test adequately.  MUSCULOSKELETAL:  She has excellent range of motion in her lumbar spine.  She is actually able to touch her toes and her lumbar extension is quite  good as well.  She does report some discomfort in her low back at end  range with both of these maneuvers.  NEUROLOGIC:  Seated, her reflexes are diminished at the ankles and 1+ at  the patella tendon.  No abnormal tone is noted.  No clonus is noted.  She has good strength in both lower extremities, 5/5 at the hip flexors,  knee extensors, dorsiflexors, and plantar flexors.  No new sensory  deficits are  appreciated.  Internal and external rotation of bilateral  hips does not increase her pain.  Palpation over her cervical, thoracic,  and lumbar spine as well as over the trochanters does not reveal any  increased tenderness.   IMPRESSION:  1. Status post multiple falls with a history of feeling somewhat      lightheaded.  2. History of lumbar stenosis/lumbar lumbago with decreased ability to      ambulate for prolonged period of time.  3. History of facet arthropathy.  4. Right total hip replacement.   PLAN:  1. We will refill her Ultram 1 p.o. b.i.d. p.r.n. for back and leg      pain, #60.  2. Orthostatic hypotension, patient has a drop in her blood pressure      when she stands up going from 127/70 to 98/59.  A call over to Dr.      Geanie Logan office.  A discussion  with him and he would like to see      her today.  This was arranged with nursing staff for him to see      her.  They will give her a call when she gets home and set up an      appointment.  3. She has been stable on the Ultram, tolerating it well.  4. We will also set her up for a right hip x-ray.  She has a total hip      replacement on that side.  Status post her falls, would like to      check  this.  We will also set her up for physical therapy and      balance program.  5. I will see her back in a month.           ______________________________  Brantley Stage, M.D.     DMK/MedQ  D:  04/17/2007 11:29:58  T:  04/17/2007 12:42:26  Job #:  [id number]   cc:   Patrica Duel, M.D.  Fax: [phone]

## 2010-08-16 NOTE — Procedures (Signed)
[name], [name]               ACCOUNT NO.:  [id number]   MEDICAL RECORD NO.:  [id number]          PATIENT TYPE:  REC   LOCATION:  [facility]                          FACILITY:  [facility]   PHYSICIAN:  Erick Colace, M.D.DATE OF BIRTH:  09-22-1940   DATE OF PROCEDURE:  12/25/2006  DATE OF DISCHARGE:                               OPERATIVE REPORT   PROCEDURE:  L5-S1 translaminar lumbar epidural steroid injection under  fluoroscopic guidance.   INDICATIONS:  Lumbar radiculitis, previous good relief from epidural at  this level Aug 21, 2006.   Informed consent was obtained after describing risks and benefits of the  procedure to the patient.  These include bleeding, bruising, infection,  loss of bowel and bladder function, temporary or permanent paralysis.  She elects proceed and has given her consent.  The patient placed prone  on fluoroscopy table.  Betadine prep, sterile drape, 25-gauge 1-1/2 inch  needle was used to incise skin and subcu tissue, 1% lidocaine x3 mL.  Then an 18-gauge Hustead was inserted under fluoroscopic guidance  targeting the inferior lamina of L5, right paramedian approach.  Bone  contact made, needle was directed inferiorly, loss of resistance  obtained, confirmed with Omnipaque 180.  During injections of Omnipaque  180, she described some right lower extremity pain which subsided after  the injectate.  Then a solution containing 2 mL of 40 mg/mL Depo-Medrol  and 2 mL of 1% lidocaine were injected.  During the injection, the  patient complained of mild pain going down the right lower extremity  which subsided with the injection stopping.  This injection was  completed very slowly and she tolerated this.  She went from the pre-  injection pain 8/10 to post injection 6/10 and post injection  instructions were given.      Erick Colace, M.D.  Electronically Signed     AEK/MEDQ  D:  12/25/2006 16:40:41  T:  12/26/2006 11:26:02  Job:  [id number]

## 2010-08-16 NOTE — Assessment & Plan Note (Signed)
NAMEMarland Kitchen  ANTONELA, FREIMAN                CHART#:  [id number]   DATE:  04/18/2007                       DOB:  01/19/1941   REFERRING PHYSICIAN:  Patrica Duel, M.D.   PROBLEM LIST:  1. Nausea and vomiting secondary to gastroesophageal reflux disease.  2. Diarrhea secondary to irritable bowel and/or diabetic enteropathy.  3. Morbid obesity.  4. Diabetes.  5. Hypertension.  6. Asthma.  7. Sleep apnea.  8. Anxiety.  9. Arthritis.  10.Hysterectomy.  11.Tubular adenoma in 2008.   SUBJECTIVE:  Ms. Dragos is a 70 year old female who presents as a  return patient visit.  She thinks the Bentyl and the Fibercon are  working.  Now that she is taking her Nexium things are better.  She  usually has abdominal pain right before she has to have a bowel  movement.  She is pretty excited at church and is looking forward to  2009.   OBJECTIVE:  Weight:  227 pounds.  Height:  5 feet 1-1/2 inches.  Temperature:  98.  Blood pressure:  120/82.  Pulse:  88.  GENERAL:  She is in no apparent distress.  Alert and oriented x4.LUNGS:  Clear to auscultation bilaterally.CARDIOVASCULAR:  Regular rhythm.  No  murmur.  Normal S1 and S2.ABDOMEN:  Bowel sounds are present.  Soft,  nontender, nondistended.   ASSESSMENT:  Ms. Cerveny is a 70 year old female whose nausea and  vomiting is now resolved now that she is back on her Nexium.  Her  diarrhea is controlled with Bentyl and Fibercon.  She was wondering if  she was a candidate for gastric bypass.   Thank you for allowing me to see [name] in consultation.  My  recommendations follow.   RECOMMENDATIONS:  1. She should continue Nexium.  2. She should continue Bentyl and Fibercon.  She should continue her      lactose free high fiber diet.  3. She is interested in gastric bypass surgery.  I asked her to speak      with Dr. Nobie Putnam to      make the referral.  4. She has a follow up appointment to see me in four months.       Kassie Mends, M.D.  Electronically Signed     SM/MEDQ  D:  04/18/2007  T:  04/18/2007  Job:  [id number]   cc:   Patrica Duel, M.D.

## 2010-08-16 NOTE — Assessment & Plan Note (Signed)
NAMEMarland Kitchen  ESMERELDA, FINNIGAN                CHART#:  [id number]   DATE:  10/24/2007                       DOB:  03-Apr-1941   REFERRING PHYSICIAN:  Erle Crocker, MD   PROBLEM LIST:  1. 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  2. Vomiting.  3. Hypertension.  4. GERD.  5. Recurrent UTIs.  6. Diabetes.  7. COPD.  8. Hysterectomy.   SUBJECTIVE:  Mrs. Bultman is a 70 year old female who feels great  now.  She presents as a return patient visit.  She was last seen in May  2009 because of her persistent vomiting.  She had gastropexy and a week  after her surgery, she continued to have symptoms.  She reports being  nauseated several times, but is taking the Reglan 4 times a day.  She  has had no need for Carafate for the last 2 weeks.  She may use  Phenergan once a day.  She stopped the HyoMax because it was making her  sick in her body.  Her husband is currently at a family reunion.  She  is not having any constipation now.  She is having 2 watery stools a day  and feels the best she has felt in a while.  Her appetite is barely  coming back.   MEDICATIONS:  1. Carafate.  2. Phenergan.  3. Trazodone.  4. Actos.  5. Advair.  6. Veramyst.  7. ProAir.  8. Aspirin 81 mg daily.  9. Metoclopramide 5 mg q.a.c. and at bedtime.   OBJECTIVE:   PHYSICAL EXAMINATION:  VITAL SIGNS:  Weight 224 pounds (down 4 pounds  since April 2009), height 5 feet and a 1/2 inch, temperature 98, blood  pressure 118/80, and pulse 72.  GENERAL:  She is in no apparent distress.  Alert and oriented x4. LUNGS:  Clear to auscultation bilaterally. CARDIOVASCULAR:  Regular rhythm.  No  murmur.  ABDOMEN:  Bowel sounds are present.  Soft, nontender, and nondistended.  No rebound or guarding.   DIAGNOSTIC STUDIES:  Electrogastrogram in July of 2009, reveals mixed  gastric dysrhythmia and poor water load test.   ASSESSMENT:  The patient is a 70 year old female whose vomiting is now  resolved.  Her vomiting and nausea have been treated successfully with  Reglan.  Her postop nausea may have been related to anesthesia.  Thank  you for allowing me to see the patient in consultation.  My  recommendations follow.   RECOMMENDATIONS:  1. She should continue with Reglan 5 mg q.a.c. and at bedtime.  She      may use Carafate and Phenergan as needed.  2. I encouraged her to eat 4 to 6 small meals daily.  3. She has a follow up visit to see me in 3 months.       Kassie Mends, M.D.  Electronically Signed     SM/MEDQ  D:  10/24/2007  T:  10/25/2007  Job:  [id number]   cc:   Erle Crocker, M.D.

## 2010-08-16 NOTE — Op Note (Signed)
[name], [name]               ACCOUNT NO.:  [id number]   MEDICAL RECORD NO.:  [id number]          PATIENT TYPE:  AMB   LOCATION:  DAY                           FACILITY:  [facility]   PHYSICIAN:  Kassie Mends, M.D.      DATE OF BIRTH:  [birth year]   DATE OF PROCEDURE:  DATE OF DISCHARGE:  08/12/2007                               OPERATIVE REPORT   PROCEDURE:  Bravo capsule study.   INDICATIONS FOR PROCEDURE:  [name] is a 70 year old female who  continues to complain of intractable vomiting.  She has a past medical  history of gastroesophageal reflux disease requiring Nexium twice a day.  She had a normal gastric emptying study in February of 2009.  Upper GI  study in April of 2009 revealed normal esophageal distention and  motility.  The stomach appears to lie in the correct orientation when  she is supine.  However, when she stands up her stomach rotates towards  organoaxial.  She presented for upper endoscopy to evaluate for  stricture, mass, erosive esophagitis or gastritis causing vomiting.  Her  upper GI also showed evidence of thickening in the duodenal bulb.   FINDINGS:  The Bravo pH study was done on therapy.  She was asked to  take omeprazole 20 mg 30 minutes before her first and last meal.  On day  one of the study the analysis period was 22 hours and 26 minutes.  She  had 10 episodes of reflux.  She had 1 episode that lasted greater than 5  minutes.  The longest duration of the reflux episode was 6 minutes.  She  spent 13 minutes at a pH less than 4.  Her DeMeester score on day 1 was  3.8.  Normal is less than 14.2.  On day 2 of the analysis the study  period was 21 hours and 52 minutes.  She had 15 episodes of reflux.  One  lasted greater than 5 minutes.  The duration of the longest episode of  reflux was 6 minutes.  She spent 14 minutes at a pH less than 4.  Her  DeMeester score on day 2 was 4.5.  Normal is less than 14.72.  Her  symptom association probability with  regurgitation was 5.6% and chest  pain 39.6%.   ASSESSMENT:  [name] has adequate acid suppression with b.i.d.  proton pump inhibitor therapy.   RECOMMENDATIONS:  1. She should consider initiation of therapy with amitriptyline to      treat for non-ulcer dyspepsia.  2. Will discontinue the Reglan around-the-clock.  3. Consider surgical consultation for gastropexy.  She has no      indication for Nissen fundoplication.      Kassie Mends, M.D.  Electronically Signed     SM/MEDQ  D:  08/15/2007  T:  08/15/2007  Job:  [id number]   cc:   Erle Crocker, M.D.   Dalia Heading, M.D.  Fax: [phone]

## 2010-08-16 NOTE — Assessment & Plan Note (Signed)
[name] is a married 70 year old African-American female who has  been seen in our [facility] for chronic low back pain and  right leg pain.  She is back in today.  She has undergone an L5-S1,  translaminar epidural injection on December 25, 2006.  She reports  I  am doing real good in the pain area.  She is sleeping well and she  states she is using her cane for balance.  Physical therapy was  recommended.  She states I hate going to therapy and was not  interested in any kind of therapeutic balance program.  She reports zero  pain today in the clinic.  She is sleeping well.  Pain is typically  worse with walking, standing or sitting, sometimes prolonged standing  and improved with heat rest and injections.  She takes Ultram 1 tablet,  sometimes 1 every other day.  She is able to climb stairs.  She is not  driving.   REVIEW OF SYSTEMS:  Significant for bladder control problems.  She  states that she has a 3-year history of wearing Depends and would like  to see somebody to evaluate this, although she has seen 2 urologists in  the past.   No changes in her past medical history, social or family history since  our last visit.   EXAM TODAY:  VITAL SIGNS:  Blood pressure is 123/100, pulse 60,  respirations 18, 100% saturation on room air.  GENERAL:  She is an obese African-American female who does not appear in  any distress.  She is oriented x3.  Her speech is clear.  Affect is  bright, alert, she is cooperative and pleasant.  She follows commands  without difficulty.  Transitions from sitting to standing without difficulty.  Gait in the  room is stable.  Tandem gait, Romberg test are performed adequately.  Reflexes are symmetric and intact in the lower extremities.  Motor  strength is 5/5.  She does have some discomfort in the right hip during  hip flexion.   IMPRESSION:  1. Lumbar stenosis/lumbago with decreased ability to ambulate for a      prolonged period of  time.  2. History of facet arthropathy.  3. Right total hip replacement.  4. 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   PLAN:  Other medical problems include diabetes, hypertension, asthma,  irritable bowel, panic attacks, she is status post a cerebral aneurysm  in 2002.  Has a history of bladder incontinence.  She does not need a  refill on her prescription for Ultram today.  She is currently not  interested in any kind of physical therapy program.  She would like to  follow up with the urologist for her bladder incontinence.  We will have  that set up for her.  We will see her back in 2 months.           ______________________________  Brantley Stage, M.D.     DMK/MedQ  D:  01/23/2007 13:30:15  T:  01/24/2007 09:14:10  Job #:  [id number]   cc:   Jamison Neighbor, M.D.  Fax: [phone]

## 2010-08-16 NOTE — Assessment & Plan Note (Signed)
The patient is a 70 year old African-American female who has been  followed in our pain and rehabilitation clinic for low back pain and leg  pain.  She is back in today and states I don't have any pain in my body  whatsoever.   She has just participated in a physical therapy program with an emphasis  on improving balance.  She has had some complaints of worsening balance  over the last several months.   She states that she has several near falls and tends to grab the walls  if she might be going down.   She believes the physical therapy program helped somewhat, but is still  having this complaint.  Incidentally she also notes that she has  occasional fluttery feelings in her chest.   She apparently has had workup in the past recently which may have  included carotid Dopplers and a CT scan of her head, although, I do not  have access to the results of these studies.   FUNCTIONAL STATUS:  The patient is independent with her feeding,  dressing, and toiletting, and sometimes needs some help with bathing and  household duties.  She is also independent with meal prep and shopping.  She is able to walk a few minutes at a time and reports some difficulty  climbing stairs.   No changes in review of systems.  She denies depression, anxiety, or  suicidal ideation.   PAST MEDICAL HISTORY:  Remarkable for a new primary care doctor.  She  recently saw Erle Crocker, M.D.  She states that Dr. Jen Mow has  decreased many of her medications but the patient does not have a list  of what she is currently taking or any of the changes that have been  made.   PHYSICAL EXAMINATION:  VITAL SIGNS:  Blood pressure 125/82, pulse 66,  respirations 18, 97% saturated on room air.  GENERAL:  She is an obese African-American female who does not appear in  any distress.  She is oriented x3 and her speech is clear.  Her affect  is bright.  She is alert and talkative.   Transitioning from sitting to standing is  done with ease.  Her gait in  the room is within normal limits.  She has difficulty with tandem gait  and a little bit of problems with Romberg's test as well.   Her reflexes are diminished in the lower extremities.  Tone is within  normal limits.  Sensory examination is intact to light touch, vibratory  sense as well as position sense in the lower extremities.  Her motor  strength is quite good without focal deficits.   IMPRESSION:  1. Status post multiple falls with intermittent feelings of      lightheadedness and fluttering in the chest.  2. History of lumbar stenosis/lumbar lumbago with decreased ability to      ambulate for prolonged period of time.  3. History of facet arthropathy.  4. Right total hip replacement.   PLAN:  I discussed her case today with Dr. Jen Mow.  Dr. Jen Mow would like  the patient to follow up with her in her office for further evaluation  of her dizziness and history of falls.   The patient does not need Ultram today.  She has not needed to take it  since she is not having much pain at this point.  She does have some  intermittent lateral hip pain and would like a prescription for Lidoderm  for that and we  will go ahead and write that out for her as well.  I will see her back  in a month.  She has completed her physical therapy and is stable on  medications prescribed by our clinic.           ______________________________  [name], M.D.     DMK/MedQ  D:  06/12/2007 14:38:36  T:  06/12/2007 23:55:39  Job #:  [id number]   cc:   Erle Crocker, M.D.

## 2010-08-16 NOTE — Assessment & Plan Note (Signed)
NAMEMarland [name]  [name], [name]                CHART#:  [id number]   DATE:  01/14/2008                       DOB:  29-Jul-1940   REFERRING PHYSICIAN:  Erle Crocker, MD.   PROBLEM LIST:  1. 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  2. Vomiting and nausea likely secondary to mixed gastric dysrhythmia      on EGG in July 2009.  3. Hypertension.  4. Gastroesophageal reflux disease.  5. Recurrent urinary tract infections.  6. Diabetes.  7. Chronic obstructive pulmonary disease.  8. Hysterectomy.   SUBJECTIVE:  The patient is a 70 year old female who presents as a  return patient visit.  She was last seen in July 2009.  She reports one  episode of vomiting that lasted less than a day since her last visit.  She is taking Reglan twice a day.  She is just using the Phenergan and  the Carafate as needed.  She is feeling much better and is able to get  out more and pray for people.   MEDICATIONS:  1. Carafate as needed.  2. Phenergan as needed.  3. Trazodone nightly.  4. Actos 15 mg daily.  5. Advair.  6. Veramyst.  7. ProAir.  8. Fiber as needed.  9. Metoclopramide 5 mg b.i.d.   OBJECTIVE/PHYSICAL EXAM:  VITAL SIGNS:  Weight 224 pounds (unchanged  since July 2009), height 5-1/2 inches, temperature 97.9, blood pressure  118/80, and pulse 64.  GENERAL:  She is in no apparent distress.  Alert and oriented x4.LUNGS:  Clear to auscultation bilaterally.cARDIOVASCULAR:  Regular  rhythm.ABDOMEN:  Bowel sounds are present.  Soft, nontender, and  nondistended.   ASSESSMENT:  The patient is a 70 year old female with intermittent  nausea and vomiting, which is likely secondary to gastric dysrhythmia.  She has responded well to Reglan.  Her symptoms are significantly  improved after gastropexy.  Thank you for allowing me to see the patient  in consultation.  My recommendations follow.   RECOMMENDATIONS:  1. She should continue the Reglan.  2. She should continue the  recommendations for gastroparesis diet.  3. She is in follow up appointment to see me in 6 months.       Kassie Mends, M.D.  Electronically Signed     SM/MEDQ  D:  01/14/2008  T:  01/14/2008  Job:  [id number]   cc:   Erle Crocker, M.D.

## 2010-08-16 NOTE — Assessment & Plan Note (Signed)
NAME:  KEYANAH, KOZICKI                CHART#:  [id number]   DATE:                                   DOB:  25-Nov-1940   REFERRING PHYSICIAN:  Patrica Duel, M.D.   PROBLEM LIST:  1. Nausea and vomiting secondary to gastroesophageal reflux disease.  2. 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  3. Diabetes.  4. Hypertension.  5. Asthma.  6. Anxiety.  7. Sleep apnea.  8. Arthritis.  9. Hysterectomy.   SUBJECTIVE:  Ms. Ewell is a 70 year old female who presents as a  return patient visit.  She has had a problem with diarrhea since 2007.  She reports that she is having to drink Imodium and use her Bentyl.  She  also complains of pain in her stomach every day.  No vomiting.  She has  5-10 watery bowel movements, which she considers large volume daily.  She denies any blood in her stool or fever.  She did have her hydrogen  breath test and reportedly it was negative.  She has been taking more  Imodium AD than is prescribed on the bottle.  She continues to only take  her Bentyl twice a day.  She says she is following a low-fat diet. She  reports her study at [facility] was negative for bacterial overgrowth.   MEDICATIONS:  1. Metoprolol.  2. Lisinopril/HCTZ.  3. Xanax, 1/2 in the morning, 1/2 at night.  4. Actos plus Metformin twice daily.  5. Advair.  6. Veramyst.  7. Beano.  8. Provera.  9. Nexium 40 mg daily.  10.Bentyl 10 mg twice daily.  11.Imodium.  12.Fibercon 3-4 times a day.   OBJECTIVE:  Weight 232 pounds (up 3 pounds since April 2008), height 5  feet, BMI 45.3 (morbidly obese).  Temperature 97.5, blood pressure  102/70, pulse 64.  General:  She is in no apparent distress, alert and  oriented times 4.  Her lungs are clear to auscultation bilaterally.  Cardiovascular Exam:  Regular rhythm.  No murmur.  Abdomen:  Bowel  sounds are present, soft, mild tenderness to palpation in the left lower  quadrant and right upper quadrant without rebound or  guarding.  She has  moderate tenderness to palpation in the right lower quadrant without  rebound or guarding.  Her extremities have no cyanosis, clubbing or  edema.   ASSESSMENT:  Ms. Ocampo is a 70 year old female who has diarrhea,  likely secondary to irritable bowel or diabetic enteropathy.  Her  symptoms are not well controlled on Bentyl 10 mg twice a day.  She did  have a 72-hour stool study, which showed mildly elevated fecal fat at 8  g/day (2-7).  Total weight of the specimen was 808 grams for 72 hours.  Thank you for allowing me to see [name] in consultation.  My  recommendations follow.   RECOMMENDATIONS:  1. We will increase Ms. Haskell's Bentyl to 20 mg daily before every      meal and daily at bedtime.  2. She can take Fibercon at least twice daily.  3. She is to follow a lactose-free, high fiber diet.  She is given a      handout on lactose-free and high fiber diet.  4. She declined more frequent follow up with an extender.  She  will be      seen as an outpatient in 3 months.  She is given her discharge      instructions in writing.       Kassie Mends, M.D.  Electronically Signed     SM/MEDQ  D:  12/26/2006  T:  12/27/2006  Job:  [id number]   cc:   Patrica Duel, M.D.

## 2010-08-16 NOTE — Assessment & Plan Note (Signed)
NAMEMarland Kitchen  ROTHA, CASSELS                CHART#:  [id number]   DATE:  08/08/2007                       DOB:  04/22/1940   PRIMARY CARE PHYSICIAN:  Erle Crocker, M.D.   PROBLEM LIST:  1. Nausea and vomiting secondary to gastroesophageal reflux disease      and hiatal hernia.  2. Diarrhea secondary to irritable bowel and/or diabetic enteropathy.  3. Morbid obesity.  4. Diabetes.  5. Hypertension.  6. Asthma.  7. Sleep apnea.  8. Anxiety.  9. Arthritis.  10.Hysterectomy.  11.Tubular adenoma in 2008.   SUBJECTIVE:  Ms. Baum is a 70 year old female who is seen as a  return patient visit.  She was last seen in clinic on July 26, 2007  because of intractable nausea and vomiting.  In spite of her intractable  nausea and vomiting, she is not losing any weight.  An upper GI with  high-density contrast was performed.  It showed focal eventration of the  left diaphragm associated with proximal portion of the stomach  accompanying the eventration into the inferior left thorax.  There was  mild narrowing of the mid gastric lumen where the stomach traverses the  reflected part of the diaphragm causing dilation of the proximal stomach  and positional delay in gastric emptying.  It was felt that this was  potentially a source for functional obstruction of the mid stomach with  gastric retention and delayed gastric emptying.  Her last upper  endoscopy was in 2007 which showed a hiatal hernia.  It was performed by  Yancey Flemings in [place].  She has not been taking her Nexium because  she said she cannot afford it.  She says she lost 40 pounds in January  after she was vomiting for 2 weeks straight.  Last Wednesday, she  vomited for 2 hours.  She ran out of her Reglan yesterday.  She has  vomiting and then abdominal pain.  Occasionally, she has bilateral lower  quadrant abdominal pain.  She describes the abdominal pain as a burning  sensation located in her upper abdomen and sometimes  stabbing.  She has  heartburn several times a day.   MEDICATIONS:  1. Carafate.  2. Phenergan.  3. Metoprolol.  4. Calcium.  5. Trazodone.  6. Actos 15 mg daily.  7. Advair 2 puffs daily.  8. Veramyst 2 puffs daily.  9. ProAir 2 puffs every 6 hours as needed.  10.Fiber daily.  11.Aspirin 81 mg daily.  12.HyoMax slow release b.i.d.   OBJECTIVE:  VITAL SIGNS:  Weight 231 pounds (unchanged since April of  2008), height 5 feet and 1/2 inch.  Temperature 98, blood pressure  130/88, pulse 16.  GENERAL:  She is in no apparent distress.  Alert and oriented x4.  LUNGS:  Clear to auscultation bilaterally.  CARDIOVASCULAR:  Regular  rhythm, no murmur.  ABDOMEN:  Bowel sounds present, soft, nontender, nondistended.  NEUROLOGIC:  She has no focal neurologic deficits.   ASSESSMENT:  Ms. Rudnicki is a 70 year old female who has intermittent  vomiting associated with abdominal pain.  Her symptoms are  multifactorial.  It is possible that she has intermittent gastric outlet  obstruction due to her proximal stomach eventrating into her thoracic  cavity.  She may also have uncontrolled gastroesophageal reflux disease.   Thank you for allowing me to see  Ms. Surratt in consultation.  My  recommendations follow.   RECOMMENDATIONS:  1. We will add omeprazole 20 mg 30 minutes prior to her first and last      meal.  2. She is asked to restart her Reglan 5 mg q.a.c. any q.h.s.  3. Will refer her to Dr. Franky Macho for hiatal hernia repair and      consideration for a Nissen fundoplication.  4. She is asked to consume a soft diet.  She is asked to consume meats      that are only chopped or ground.  She is not to eat any raw      vegetables.  5. She has return patient visit to see me in 2 months.       Kassie Mends, M.D.  Electronically Signed     SM/MEDQ  D:  08/09/2007  T:  08/09/2007  Job:  [id number]   cc:   Dalia Heading, M.D.  Erle Crocker, M.D.

## 2010-08-19 NOTE — Discharge Summary (Signed)
NAMEORVETTA, [name]                         ACCOUNT NO.:  [id number]   MEDICAL RECORD NO.:  [id number]                   PATIENT TYPE:  INP   LOCATION:  3713                                 FACILITY:  [facility]   PHYSICIAN:  Rene Paci, M.D. [facility]          DATE OF BIRTH:  22-Oct-1940   DATE OF ADMISSION:  10/21/2002  DATE OF DISCHARGE:  10/23/2002                                 DISCHARGE SUMMARY   DISCHARGE DIAGNOSES:  1. Headache.  2. Substernal chest pain.  3. Nausea and vomiting.   BRIEF ADMISSION HISTORY:  [name] is a 70 year old African-American  female well known to this service. She presented with severe headache and  arm tingling. She also complained of substernal chest pain, nausea and  vomiting.   PAST MEDICAL HISTORY:  1. Nonobstructive coronary artery disease by catheterization in 1999 and     December 2003.  2. Hypertension.  3. Gastroesophageal reflux disease.  4. Irritable bowel syndrome.  5. 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  6. Status post total abdominal hysterectomy, status post TRH.  7. History of CVA.   HOSPITAL COURSE:  1. Cardiovascular. The patient presented with chest pain. Serial cardiac     enzymes were negative. EKG without ischemia. As noted, the patient had a     normal cardiac catheterization in December 2003. We did ask cardiology to     see the patient, and they felt that her presentation did not suggest     cardiac ischemia and felt no further cardiac workup was needed at this     time. They recommended outpatient followup with Dr. Daleen Squibb in [place].     At that time, a Cardiolite could be considered.  2. Neurologic. 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 She is status post cerebral angiogram     January 2004 and then status post endovascular obliteration of the right     ICA aneurysm in February 2004. She did have a followup angiogram in May     2004 that showed  a continued occluded right internal artery terminus     without evidence of coil impaction. Dr. Corliss Skains did look at her films     during this admission and noted that angio coil looked fine without     evidence of bleed, and he reassured the patient and noted that he would     follow up with her in a month to repeat her angio. The patient was also     complaining of headache this admission. This may have been exacerbation     by nitroglycerin. It did resolve when the nitroglycerin was discontinued.  3. Glucose intolerance. The patient has serum glucose of 239 on admission;     however, CBGs ranged from the 90s to 149 at the max. We will defer any     further  treatment to her primary care physician.   Of note, the patient was not satisfied with the lack of answers to her  current symptoms. She is not satisfied with a diagnosis of vertigo or  labyrinthitis, although her symptoms of dizziness did resolve with the  meclizine. We also noted that physical therapy saw the patient and noted  that she did not have any deficits but felt she might benefit from a  vestibular evaluation as an outpatient. The patient is not interested in  this at this time. She did state that she was reassured that Dr. Corliss Skains  did not see she had any recurrent aneurysm or new aneurysm and that the coil  looked okay to him. She did again state she was not satisfied that she was  not given answers to the questions that she had, and she was frustrated that  we were not able to provide her with a diagnosis for her symptoms. She  stated she was considering obtaining a second opinion in either [address], [place], or [facility]. I encouraged the patient to seek a  second opinion and offered that Dr. Regino Schultze in [place] should be able to  assist her with that referral.   DISCHARGE LABORATORY DATA:  Urinalysis was negative. BMET was unremarkable.  Lipase normal. Hemoglobin 11.9. Cardiac enzymes were negative.  TSH was  0.795. Hemoglobin A1c was elevated at 7%. Head CT showed embolization coils  within the region of the right MCA and chronic ischemic changes. No evidence  of acute intracranial pathology.   MEDICATIONS AT DISCHARGE:  1. Premarin 0.625 mg q.d.  2. Toprol-XL 100 mg q.d.  3. Singulair 10 mg q.d.  4. Advair 250/50 one puff b.i.d.  5. Aspirin 325 mg q.d.  6. Meclizine 25 mg q.4-6h. p.r.n.  7. Nexium 40 mg q.d.   DISCHARGE INSTRUCTIONS:  The patient has been instructed to follow up with  Dr. Regino Schultze early next week. At that time, he can discuss with her perhaps  her diagnosis of diabetes. He also discuss with her possible referral to a  teaching university. She is to follow up with Dr. Daleen Squibb in one to two months,  and at that time, a Cardiolite will be considered. She also has instructions  to follow up with Dr. Corliss Skains in a month.     Cornell Barman, P.A. [facility]                  Rene Paci, M.D. [facility]    LC/MEDQ  D:  10/23/2002  T:  10/24/2002  Job:  [id number]   cc:   Thomas C. Wall, M.D.   Corliss Skains, M.D.    cc:   Jesse Sans. Wall, M.D.   Corliss Skains, M.D.

## 2010-08-19 NOTE — Procedures (Signed)
   NAMERYLYN, ZAWISTOWSKI                         ACCOUNT NO.:  [id number]   MEDICAL RECORD NO.:  [id number]                   PATIENT TYPE:  EMS   LOCATION:  ED                                   FACILITY:  [facility]   PHYSICIAN:  Edward L. Juanetta Gosling, M.D.             DATE OF BIRTH:  [birth year]   DATE OF PROCEDURE:  03/19/2002  DATE OF DISCHARGE:  03/19/2002                                EKG INTERPRETATION   DATE AND TIME OF TEST:  March 19, 2002 at 0325.   IMPRESSION:  The rhythm is a sinus rhythm with a rate in the 60s.  There are  T wave abnormalities which are diffuse and fairly nonspecific.  There are T  waves inferiorly and laterally which may be of no significance, but clinical  correlation is suggested.                                               Edward L. Juanetta Gosling, M.D.    ELH/MEDQ  D:  03/20/2002  T:  03/22/2002  Job:  [id number]

## 2010-08-19 NOTE — Assessment & Plan Note (Signed)
[name]                           GASTROENTEROLOGY OFFICE NOTE   MAPLE, ODANIEL                      MRN:          [id number]  DATE:02/07/2006                            DOB:          April 08, 1940    REASON FOR CONSULTATION:  Large hiatal hernia and reflux disease.   HISTORY:  This is a 70 year old African American female with a history of  gastroesophageal reflux disease, irritable bowel syndrome, asthmatic  bronchitis, hyperlipidemia, hypertension and morbid obesity who is referred  through the courtesy of Stevphen Rochester regarding hiatal hernia and reflux  disease. The patient has been evaluated in this office dating back to 2000.  She is noted to have reflux disease, hiatal hernia, peptic stricture and  longstanding irritable bowel syndrome. She last underwent upper endoscopy  May 05, 2000. She was found to have a hiatal hernia and erosions only.  She has undergone prior esophageal dilatation, though currently is having no  problems with dysphagia. Her last colonoscopy was in 2000. It was normal,  except for diverticulosis and hemorrhoids. She reports now problems with  indigestion and heartburn. This has been worse since her Nexium is no longer  covered by insurance. She has been on Prilosec over-the-counter 20 mg daily.  In addition to significant heartburn and indigestion, she reports nausea and  regurgitation, possibly some dysphagia. Chronic lower abdominal complaints  that she has had for decades include gas, bloating, pain and loose stools.  These are unchanged. She has had no change in her appetite and unfortunately  no weight loss. She denies GI bleeding. She tells me that she recently saw  Dr. Jethro Bolus regarding her asthma. He apparently did a chest x-ray and  said that she had a huge hiatal hernia. She tells me that maybe her  breathing difficulties are being exacerbated by her reflux. She denies to me  any choking  spells at night or anything that sounds like laryngospasm.   ALLERGIES:  No known drug allergies.   CURRENT MEDICATIONS:  Allergy shots, Toprol XL 100 mg daily, Premarin 0.625  mg daily, Singulair 10 mg daily, Flonase, omeprazole 20 mg daily, baby  aspirin, Avandamet 07/998 mg daily, lisinopril HCTZ 10/12.5 mg daily,  triazolam 0.25 mg p.r.n., Flovent, Proventil and albuterol.   PAST MEDICAL HISTORY:  Extensive as discussed above.   PAST SURGICAL HISTORY:  She has had hysterectomy, tubal ligation, and  coronary angioplasty.   FAMILY HISTORY:  Brother with unspecified liver disease. No gastrointestinal  malignancy.   SOCIAL HISTORY:  Married with five children, accompanied by her spouse.  Employed previously at [employer]. Does not smoke or use alcohol.   REVIEW OF SYSTEMS:  Per diagnostic evaluation form.   PHYSICAL EXAMINATION:  Pleasant, morbidly obese female in no acute distress.  Blood pressure is 122/84, heart rate is 88 and regular, weight is 223  pounds. She is less than 5 feet in height.  HEENT: Sclera anicteric. Conjunctivae are pink. Oral mucosa intact. No  thrush. There is no adenopathy.  LUNGS:  Are clear to auscultation and percussion.  HEART: Is regular without murmur.  ABDOMEN: Markedly obese and soft without tenderness, mass or hernia. Good  bowel sounds heard.  EXTREMITIES: Are without edema.   IMPRESSION:  1. Gastroesophageal reflux disease with recurrent dysphagia likely due to      recurrent peptic stricture.  2. Hiatal hernia on recent x-ray. Known to have hiatal hernia in the past.  3. Chronic irritable bowel syndrome.  4. Morbid obesity, contributing to multitude of medical problems, least of      which is not her reflux.   RECOMMENDATIONS:  1. Increase proton pump inhibitor therapy to Prilosec 40 mg daily.  2. Information on reflux disease as well as reflux precautions with      attention to weight loss.  3. Schedule upper endoscopy with  possible esophageal dilation. Ashby Dawes of      procedure as well as risks, benefits and alternatives have been      reviewed. She understood and agreed to proceed.  4. Ongoing management of asthma and allergies per Dr. Corinda Gubler.  5. Ongoing general medical care with Dr. Nobie Putnam.     Wilhemina Bonito. Eda Keys., MD  Electronically Signed    JNP/MedQ  DD: 02/07/2006  DT: 02/07/2006  Job #: [id number]   cc:   Kerry Kass, M.D. [facility]  Patrica Duel, M.D.

## 2010-08-19 NOTE — Consult Note (Signed)
[name], [name]               ACCOUNT NO.:  [id number]   MEDICAL RECORD NO.:  [id number]          PATIENT TYPE:  AMB   LOCATION:  DAY                            FACILITY:   PHYSICIAN:  Kassie Mends, M.D.      DATE OF BIRTH:  10/20/1940   DATE OF CONSULTATION:  05/24/2006  DATE OF DISCHARGE:                                 CONSULTATION   REASON FOR CONSULTATION:  Nausea, vomiting and diarrhea.   HPI:  [name] is a 70 year old female who presents with a history  of irritable bowel for 10-15 years.  She seemed to focus on her large  hiatal hernia.  She wants to know if surgery will fix her symptoms.  She  has had diabetes for 2-3 years.  She complains that she has heartburn  and indigestion which was controlled with Nexium and became uncontrolled  when she was switched to Prilosec 40 mg a day.  She states that paying  for Nexium was breaking her pocketbook.  She was not using a Nexium  reimbursement card.  She takes her pills 1 hour before she eats.  In the  morning she eats cereal (Cheerios).  Fifteen minutes to 20 minutes later  she has to have a watery stool with a bad odor.  She denies any blood in  her stool.  She states she lives with [name].  Following breakfast  she watches TV and then goes to the gym.  She eats her lunch and again  15-20 minutes later she has to go to the bathroom and take [name].  She knows which foods triggers her symptoms.  Cranberry juice, Pepsi,  salad and sweet tea make her symptoms worse.  She may have 15-20 bowel  movements a day on a bad day.  Her rectum can become so raw she needs  to use Tucks pads.  She has problems controlling her water, but also  she has had some difficulty controlling her bowel movements as well.  Her difficulty controlling her bowel movements has been a problem for  the last year.  Her last colonoscopy was in 2000.  I do not have report  available to me.  In the electronic medical record it states that her  last colonoscopy showed she had diverticulosis and hemorrhoids.  She  reports having stool studies performed by [name] but those records  are not available to me today.  She denies any fever.  She states she  has a rumbling associated with her watery stools.  She needs to return  to the bathroom repetitively once this cycle starts and this process may  take up to 3-4 hours.  Once the bowel movements end, her rumbling in her  stomach and abdominal pain are relieved.  She may vomit once a month.  She reports being nauseated at least 2-3 times a week.  She does have  well water.  She has not traveled outside of the [place].  She has  been on antibiotics recently for a sinus infection.   PAST MEDICAL HISTORY:  1. History of aneurysm.  2. Hypertension.  3. Asthma.  4. Anxiety.  5. Sleep apnea.  6. Irritable bowel.  7. Arthritis.  8. Osteopenia.   PAST SURGICAL HISTORY:  1. Right hip replacement 4 years ago.  2. Hysterectomy.  3. Tubal ligation.  4. Bladder attack.  5. Angioplasty.   ALLERGIES:  No known drug allergies.   MEDICATIONS:  1. Metoprolol 100 mg daily.  2. Calcium with vitamin D 2 daily.  3. Omeprazole 20 mg b.i.d.  4. Lisinopril/ACTZ 10/12.5 daily.  5. Xanax 1 mg 1/2 to 1 q.h.s.  6. Actos plus metformin 15/850 mg b.i.d.  7. Premarin.  8. Singulair.  9. [name] as needed.  10.Advair 250/50 2 puffs daily.  11.Veramyst 2 puffs daily.  12.Beano as needed.  13.Provera HFA 2 puffs q.6 hours p.r.n.   FAMILY HISTORY:  She has a family history of breast cancer, prostate  cancer and cervical cancer.  She has no family history of colon cancer  or colon polyps.   SOCIAL HISTORY:  She has 6 children age 67 to 39.  She is retired from  [employer].  She denies any alcohol or tobacco use.   REVIEW OF SYSTEMS:  Per the HPI.  Otherwise all systems are negative.   PHYSICAL EXAMINATION:  Weight 232.5 pounds.  Height 5 feet 1/2 inch.  BMI 43.8 (morbidly  obese).  Temperature 97.4, blood pressure 130/88,  pulse 64.  In general she is in no apparent distress, alert and oriented  times four. HEENT:  Atraumatic, normocephalic.  Pupils are equal and  reactive to light.  Mouth no oral lesions.  Posterior pharynx without  erythema or exudate.  Her NECK has full range of motion and no  lymphadenopathy.  Her LUNGS are clear to auscultation bilaterally.  CARDIOVASCULAR EXAM:  A regular rhythm, no murmur, normal S1 and S2.  Her ABDOMEN:  Bowel sounds are present.  Soft, nontender, nondistended,  no rebound or guarding.  Unable to appreciate any hepatosplenomegaly, no  abdominal bruits, unable to appreciate any pulsatile masses, obese.  Her  EXTREMITIES are without cyanosis, clubbing or edema.  NEURO:  She has no  focal neurologic deficits.   ASSESSMENT:  [name] is a 70 year old female with chronic watery,  non-bloody diarrhea without weight loss which is likely secondary to  irritable bowel syndrome.  The differential diagnosis includes diabetic  enteropathy, small bowel bacterial overgrowth, Giardiasis, and a low  likelihood of microscopic colitis, or C. diff colitis.  Her  gastroesophageal reflux disease is uncontrolled on Prilosec 40 mg a day.  Thank you for allowing me to see [name] in consultation.  My  recommendations follow.   RECOMMENDATIONS:  1. [name] is asked to follow a lactose-free, high-fiber diet.      She is given a handout on lactose-free and high-fiber diet and it      is explained to her.  She is instructed to still only eat what is      approved for diabetics.  She is encouraged to continue to go to the      gym.  2. She is asked to take Benefiber twice daily.  She is to add Bentyl      10 mg 1 hour before breakfast and lunch.  3. She is asked to bring stool samples to evaluate for Giardia      antigen, fecal lactoferrin and C. diff colitis. 4. I will schedule a colonoscopy as soon as possible with a  HalfLytely      bowel  prep without Dulcolax to obtain      biopsies of the colon to evaluate for microscopic colitis.  5. She will return to see me in 6 weeks.  We will give her the [facility] self care GERD handout at that time and we will continue to      discuss the benefits of weight loss.      Kassie Mends, M.D.  Electronically Signed     SM/MEDQ  D:  05/24/2006  T:  05/24/2006  Job:  [id number]   cc:   Dalia Heading, M.D.  Fax: [phone]   Patrica Duel, M.D.  Fax: [phone]

## 2010-08-19 NOTE — Cardiovascular Report (Signed)
NAME:  [name], [name]                         ACCOUNT NO.:  [id number]   MEDICAL RECORD NO.:  [id number]                   PATIENT TYPE:  INP   LOCATION:  1825                                 FACILITY:  [facility]   PHYSICIAN:  Salvadore Farber, M.D. [facility]         DATE OF BIRTH:  Jun 22, 1940   DATE OF PROCEDURE:  03/19/2002  DATE OF DISCHARGE:                              CARDIAC CATHETERIZATION   PROCEDURES:  Left heart catheterization, left ventriculogram, coronary  angiography, Perclose closure of the right femoral artery.   INDICATIONS:  The patient is a 70 year old lady who presents with the abrupt  onset of nausea, vomiting, and chest pressure at approximately 3 a.m.  She  came to the emergency room.  There electrocardiogram demonstrated normal  sinus rhythm with inferolateral T wave inversions.  Troponin is 0.09.  Based  on the slightly elevated troponin and abnormal ECG, as well as ongoing  nausea, she is referred for diagnostic angiography.   DIAGNOSTIC TECHNIQUE:  Informed consent was obtained. The patient was  enrolled in the Acuity trial prior to being brought to the catheterization  lab.  As part of that trial, she was given low molecular weight heparin  (enoxaparin) at 9:30 a.m. Under 1% lidocaine local anesthesia, a 6 French  sheath was placed in the right femoral artery using the modified Seldinger  technique. Diagnostic angiography and ventriculography were performed using  JL4, JR4 and pigtail catheters.  The arteriotomy was closed using a 6 [place]  Perclose device.  The patient tolerated the procedure well and was  transferred to the holding room in stable condition.   COMPLICATIONS:  None.   FINDINGS:  1. LV:  132/12/19 after the administration of contrast.  Ejection fraction     of approximately 65% without regional wall motion abnormality.  2. No mitral regurgitation or aortic stenosis.  3. Left main coronary artery:  Angiographically normal.  4. LAD:  A large  vessel giving rise to a single diagonal branch.  There are     mild luminal irregularities.  5. Ramus intermedius:  A large vessel which is angiographically normal.  6. Circumflex:  A large vessel giving rise to two obtuse marginal branches.     It is angiographically normal.  7. RCA:  A moderate sized dominant vessel. It is angiographically normal.    IMPRESSION/PLAN:  The patient has angiographically normal coronary arteries.  Her chest pressure and nausea are likely of noncardiac etiology.  Her  slightly elevated troponin is not explained.  History is not particularly  suggestive of pulmonary embolism, but with the elevated troponin and abrupt  onset of symptoms including chest pressure, we will check a D-dimer to  exclude this.  Salvadore Farber, M.D. [facility]    WED/MEDQ  D:  03/19/2002  T:  03/19/2002  Job:  [id number]   cc:   Kirk Ruths, M.D.  [address]  [place]  [place] [address]  Fax: [phone]   Charlton Haws, M.D. [facility]

## 2010-08-22 ENCOUNTER — Ambulatory Visit (HOSPITAL_COMMUNITY): Payer: Medicare Other | Admitting: Physical Therapy

## 2010-08-24 ENCOUNTER — Ambulatory Visit (HOSPITAL_COMMUNITY): Payer: Medicare Other

## 2010-08-26 ENCOUNTER — Ambulatory Visit (HOSPITAL_COMMUNITY): Payer: Medicare Other | Admitting: Physical Therapy

## 2010-08-31 ENCOUNTER — Ambulatory Visit (INDEPENDENT_AMBULATORY_CARE_PROVIDER_SITE_OTHER): Payer: Medicare Other | Admitting: Orthopedic Surgery

## 2010-08-31 ENCOUNTER — Ambulatory Visit (HOSPITAL_COMMUNITY): Payer: Medicare Other | Admitting: Physical Therapy

## 2010-08-31 DIAGNOSIS — M75120 Complete rotator cuff tear or rupture of unspecified shoulder, not specified as traumatic: Secondary | ICD-10-CM

## 2010-08-31 NOTE — Progress Notes (Signed)
Followup visit postop.  RIGHT rotator cuff repair, open technique.  The patient has stopped therapy because it was too, expensive. She's done her own therapy at home.  Forward elevation is limited to, 90.  She says she can comb her hair and do normal grooming, activities and is having mild to moderate pain requiring 10 mg of hydrocodone on an as-needed basis.  She did have a stitch abscess at the end of the incision, which was removed.  She is to keep a Band-Aid over that until it closes,  She'll come back in 2 weeks reevaluate her motion

## 2010-09-02 ENCOUNTER — Ambulatory Visit (HOSPITAL_COMMUNITY): Payer: Medicare Other | Admitting: Physical Therapy

## 2010-09-12 ENCOUNTER — Encounter: Payer: Self-pay | Admitting: Gastroenterology

## 2010-11-01 ENCOUNTER — Ambulatory Visit (INDEPENDENT_AMBULATORY_CARE_PROVIDER_SITE_OTHER): Payer: Medicare Other | Admitting: Orthopedic Surgery

## 2010-11-01 ENCOUNTER — Encounter: Payer: Self-pay | Admitting: Orthopedic Surgery

## 2010-11-01 DIAGNOSIS — M25369 Other instability, unspecified knee: Secondary | ICD-10-CM

## 2010-11-01 DIAGNOSIS — M238X9 Other internal derangements of unspecified knee: Secondary | ICD-10-CM

## 2010-11-01 NOTE — Progress Notes (Signed)
Previous rotator cuff repair RIGHT shoulder patient opted to do her own therapy and exited very well has functional range of motion and no pain  Complaint of RIGHT knee pain and RIGHT knee giving out for several years now no history of major trauma, history of lumbar spine problem and multiple injections in her back for disc disease.  Although she complains of no radicular symptoms she complains of the RIGHT leg giving out when she is walking straight ahead.  Exam shows stable ligaments, mild to moderate medial joint line tenderness and equivocal McMurray sign.  At this point her x-rays were negative.  For complete workup she would need to have an MRI of the knee to look in the joint to see if there is any mechanical cause for her knee to give out otherwise it is likely that she has a lumbar disc problem causing weakness in the RIGHT leg  We will try to get an MRI done of her RIGHT lower extremity if they don't allow it then we can send her for therapy

## 2010-11-09 ENCOUNTER — Telehealth: Payer: Self-pay | Admitting: Radiology

## 2010-11-09 NOTE — Telephone Encounter (Signed)
I called to give the patient her MRI appointment at [facility] on 11-14-10 at 9:45. Patient has [name], authorization # [id number] and it expires on 12-24-10. Patient will follow up back here to go over her results.

## 2010-11-14 ENCOUNTER — Ambulatory Visit (HOSPITAL_COMMUNITY)
Admission: RE | Admit: 2010-11-14 | Discharge: 2010-11-14 | Disposition: A | Discharge status: Home / Self Care | Payer: Medicare Other | Source: Ambulatory Visit | Attending: Orthopedic Surgery | Admitting: Orthopedic Surgery

## 2010-11-14 DIAGNOSIS — M7120 Synovial cyst of popliteal space [Baker], unspecified knee: Secondary | ICD-10-CM | POA: Insufficient documentation

## 2010-11-14 DIAGNOSIS — M25369 Other instability, unspecified knee: Secondary | ICD-10-CM

## 2010-11-14 DIAGNOSIS — M25569 Pain in unspecified knee: Secondary | ICD-10-CM | POA: Insufficient documentation

## 2010-11-22 ENCOUNTER — Ambulatory Visit: Payer: Medicare Other | Admitting: Orthopedic Surgery

## 2010-12-07 ENCOUNTER — Ambulatory Visit (INDEPENDENT_AMBULATORY_CARE_PROVIDER_SITE_OTHER): Payer: Medicare Other | Admitting: Orthopedic Surgery

## 2010-12-07 ENCOUNTER — Encounter: Payer: Self-pay | Admitting: Orthopedic Surgery

## 2010-12-07 ENCOUNTER — Ambulatory Visit: Payer: Medicare Other | Admitting: Orthopedic Surgery

## 2010-12-07 DIAGNOSIS — M1710 Unilateral primary osteoarthritis, unspecified knee: Secondary | ICD-10-CM

## 2010-12-07 NOTE — Progress Notes (Signed)
Followup visit MRI RIGHT knee for RIGHT knee pain and mechanical symptoms Chief complaint RIGHT knee pain with mechanical symptoms  Review of systems systems catching and locking.   MRI reviewed  No meniscal tear seen stable mild degenerative change no meniscal tear ligament tear small Baker cyst  Recommend followup as needed patient does not want any intervention at this time

## 2010-12-07 NOTE — Patient Instructions (Signed)
Arthritis of the knee   

## 2010-12-22 LAB — COMPREHENSIVE METABOLIC PANEL
ALT: 16
Albumin: 3.8
Alkaline Phosphatase: 80
BUN: 24 — ABNORMAL HIGH
Chloride: 104
Potassium: 4.5
Sodium: 139
Total Bilirubin: 0.5
Total Protein: 7.6

## 2010-12-22 LAB — DIFFERENTIAL
Basophils Absolute: 0
Basophils Relative: 0
Eosinophils Absolute: 0
Eosinophils Relative: 1
Monocytes Absolute: 0.5
Monocytes Relative: 6
Neutro Abs: 5.4

## 2010-12-22 LAB — URINALYSIS, ROUTINE W REFLEX MICROSCOPIC
Glucose, UA: NEGATIVE
Protein, ur: 30 — AB
pH: 5.5

## 2010-12-22 LAB — URINE MICROSCOPIC-ADD ON

## 2010-12-22 LAB — CBC
HCT: 36.4
Hemoglobin: 12.1
Platelets: 264
WBC: 8.4

## 2010-12-22 LAB — URINE CULTURE

## 2010-12-23 LAB — URINALYSIS, ROUTINE W REFLEX MICROSCOPIC
Bilirubin Urine: NEGATIVE
Ketones, ur: NEGATIVE
Nitrite: NEGATIVE
Protein, ur: NEGATIVE
pH: 5.5

## 2010-12-23 LAB — CREATININE, SERUM
Creatinine, Ser: 1.67 — ABNORMAL HIGH
GFR calc Af Amer: 37 — ABNORMAL LOW
GFR calc non Af Amer: 31 — ABNORMAL LOW

## 2010-12-23 LAB — DIFFERENTIAL
Eosinophils Absolute: 0
Eosinophils Relative: 0
Lymphs Abs: 1.5

## 2010-12-23 LAB — COMPREHENSIVE METABOLIC PANEL
ALT: 18
AST: 21
CO2: 27
Chloride: 101
GFR calc Af Amer: 39 — ABNORMAL LOW
GFR calc non Af Amer: 32 — ABNORMAL LOW
Sodium: 137
Total Bilirubin: 0.5

## 2010-12-23 LAB — BUN: BUN: 19

## 2010-12-23 LAB — CBC
Hemoglobin: 12.8
MCHC: 33.9
Platelets: 257
RDW: 16.1 — ABNORMAL HIGH

## 2010-12-23 LAB — LIPASE, BLOOD: Lipase: 31

## 2010-12-28 LAB — CBC
MCHC: 33.9
MCV: 87.9
Platelets: 242
RDW: 16 — ABNORMAL HIGH
WBC: 4.5

## 2010-12-28 LAB — BASIC METABOLIC PANEL
BUN: 15
Chloride: 101
Creatinine, Ser: 1.65 — ABNORMAL HIGH
Glucose, Bld: 113 — ABNORMAL HIGH

## 2011-01-03 ENCOUNTER — Ambulatory Visit (INDEPENDENT_AMBULATORY_CARE_PROVIDER_SITE_OTHER): Payer: Medicare Other | Admitting: Orthopedic Surgery

## 2011-01-03 DIAGNOSIS — S43429A Sprain of unspecified rotator cuff capsule, initial encounter: Secondary | ICD-10-CM

## 2011-01-03 DIAGNOSIS — M75101 Unspecified rotator cuff tear or rupture of right shoulder, not specified as traumatic: Secondary | ICD-10-CM

## 2011-01-03 MED ORDER — METHOCARBAMOL 500 MG PO TABS: 500.0000 mg | ORAL_TABLET | Freq: Four times a day (QID) | ORAL | Status: DC

## 2011-01-03 MED ORDER — IBUPROFEN 800 MG PO TABS: 800.0000 mg | ORAL_TABLET | Freq: Three times a day (TID) | ORAL | Status: DC | PRN

## 2011-01-03 NOTE — Progress Notes (Signed)
Followup visit  RIGHT shoulder pain  One week ago the patient was getting out of a chair and felt acute sharp pain in the repair RIGHT shoulder and since that time she cannot lift the arm over her head she cannot hold anything in her arm and has acute pain over the incision of the RIGHT shoulder.  The pain is constant severe deep and sharp.  She denies unexpected weight loss numbness or tingling.  RIGHT shoulder exam she is tender over the incision there appears to be a defect there.  Her active range of motion is only 30 of abduction.  Passive range of motion 100 of abduction.  The shoulder remained stable.  She has weakness in the rotator cuff supraspinatus.  She has mild external rotation weakness.  Skin incision is intact pulses normal sensory exam is normal  Impression re\re tear RIGHT rotator cuff  Plan MRI RIGHT shoulder Wear a sling Take ibuprofen Take Robaxin

## 2011-01-03 NOTE — Patient Instructions (Signed)
Wear sling   MRI will be scheduled

## 2011-01-04 ENCOUNTER — Encounter: Payer: Self-pay | Admitting: Orthopedic Surgery

## 2011-01-04 DIAGNOSIS — M75101 Unspecified rotator cuff tear or rupture of right shoulder, not specified as traumatic: Secondary | ICD-10-CM | POA: Insufficient documentation

## 2011-01-06 LAB — POCT I-STAT 4, (NA,K, GLUC, HGB,HCT)
HCT: 38 % (ref 36.0–46.0)
Sodium: 142 mEq/L (ref 135–145)

## 2011-01-09 ENCOUNTER — Other Ambulatory Visit (HOSPITAL_COMMUNITY): Payer: Self-pay | Admitting: Family Medicine

## 2011-01-09 ENCOUNTER — Other Ambulatory Visit: Payer: Self-pay | Admitting: Orthopedic Surgery

## 2011-01-09 ENCOUNTER — Ambulatory Visit (HOSPITAL_COMMUNITY)
Admission: RE | Admit: 2011-01-09 | Discharge: 2011-01-09 | Disposition: A | Discharge status: Home / Self Care | Payer: Medicare Other | Source: Ambulatory Visit | Attending: Family Medicine | Admitting: Family Medicine

## 2011-01-09 DIAGNOSIS — J189 Pneumonia, unspecified organism: Secondary | ICD-10-CM

## 2011-01-09 DIAGNOSIS — R05 Cough: Secondary | ICD-10-CM | POA: Insufficient documentation

## 2011-01-09 DIAGNOSIS — M75101 Unspecified rotator cuff tear or rupture of right shoulder, not specified as traumatic: Secondary | ICD-10-CM

## 2011-01-09 DIAGNOSIS — R062 Wheezing: Secondary | ICD-10-CM | POA: Insufficient documentation

## 2011-01-09 DIAGNOSIS — R059 Cough, unspecified: Secondary | ICD-10-CM | POA: Insufficient documentation

## 2011-01-10 ENCOUNTER — Telehealth: Payer: Self-pay | Admitting: Orthopedic Surgery

## 2011-01-10 NOTE — Telephone Encounter (Signed)
I called patient to confirm MRI appointment; left message at cell# and left detailed message + called home# (no ans machine_ - MRI scheduled at [facility] 01/11/11, registration 11:30am for appointment 12:00noon.  Follow up appointment scheduled here for 01/17/11 at 8:45am

## 2011-01-11 ENCOUNTER — Ambulatory Visit (HOSPITAL_COMMUNITY): Payer: Medicare Other

## 2011-01-13 ENCOUNTER — Telehealth: Payer: Self-pay | Admitting: Orthopedic Surgery

## 2011-01-13 ENCOUNTER — Ambulatory Visit (HOSPITAL_COMMUNITY)
Admission: RE | Admit: 2011-01-13 | Discharge: 2011-01-13 | Disposition: A | Discharge status: Home / Self Care | Payer: Medicare Other | Source: Ambulatory Visit | Attending: Orthopedic Surgery | Admitting: Orthopedic Surgery

## 2011-01-13 DIAGNOSIS — M25519 Pain in unspecified shoulder: Secondary | ICD-10-CM | POA: Insufficient documentation

## 2011-01-13 DIAGNOSIS — M75101 Unspecified rotator cuff tear or rupture of right shoulder, not specified as traumatic: Secondary | ICD-10-CM

## 2011-01-13 NOTE — Telephone Encounter (Signed)
Patient called to relay that she has re-scheduled MRI appointment [name])  for today, 01/13/11, 4:00pm; states had been ill with pneumonia.  York Spaniel should be okay to make this appointment and for her follow up scheduled here 01/17/11.

## 2011-01-16 ENCOUNTER — Ambulatory Visit (HOSPITAL_COMMUNITY): Payer: Medicare Other

## 2011-01-17 ENCOUNTER — Ambulatory Visit (INDEPENDENT_AMBULATORY_CARE_PROVIDER_SITE_OTHER): Payer: Medicare Other | Admitting: Orthopedic Surgery

## 2011-01-17 ENCOUNTER — Other Ambulatory Visit (HOSPITAL_COMMUNITY): Payer: Self-pay | Admitting: Family Medicine

## 2011-01-17 ENCOUNTER — Encounter: Payer: Self-pay | Admitting: Orthopedic Surgery

## 2011-01-17 ENCOUNTER — Ambulatory Visit (HOSPITAL_COMMUNITY)
Admission: RE | Admit: 2011-01-17 | Discharge: 2011-01-17 | Disposition: A | Discharge status: Home / Self Care | Payer: Medicare Other | Source: Ambulatory Visit | Attending: Family Medicine | Admitting: Family Medicine

## 2011-01-17 VITALS — BP 124/70 | Ht 61.0 in | Wt 228.0 lb

## 2011-01-17 DIAGNOSIS — IMO0002 Reserved for concepts with insufficient information to code with codable children: Secondary | ICD-10-CM

## 2011-01-17 DIAGNOSIS — J189 Pneumonia, unspecified organism: Secondary | ICD-10-CM

## 2011-01-17 DIAGNOSIS — E119 Type 2 diabetes mellitus without complications: Secondary | ICD-10-CM | POA: Insufficient documentation

## 2011-01-17 DIAGNOSIS — R0602 Shortness of breath: Secondary | ICD-10-CM | POA: Insufficient documentation

## 2011-01-17 DIAGNOSIS — S46911A Strain of unspecified muscle, fascia and tendon at shoulder and upper arm level, right arm, initial encounter: Secondary | ICD-10-CM

## 2011-01-17 NOTE — Patient Instructions (Signed)
Rest no lifting x 1 month

## 2011-01-17 NOTE — Progress Notes (Signed)
Followup RIGHT shoulder.  Status post rotator cuff repair.  MRI after re\re injury, RIGHT shoulder.  MRI shows a stretching of the cuff, but no re\re tear.  Patient has pneumonia.  Patient is feeling better in terms of her shoulder with some weakness and stiffness.  Recommend rest. No lifting for one month followup one month.  MRI reviewed

## 2011-02-21 ENCOUNTER — Encounter: Payer: Self-pay | Admitting: Orthopedic Surgery

## 2011-02-21 ENCOUNTER — Ambulatory Visit (INDEPENDENT_AMBULATORY_CARE_PROVIDER_SITE_OTHER): Payer: Medicare Other | Admitting: Orthopedic Surgery

## 2011-02-21 VITALS — BP 96/70 | Ht 61.0 in | Wt 228.0 lb

## 2011-02-21 DIAGNOSIS — M75101 Unspecified rotator cuff tear or rupture of right shoulder, not specified as traumatic: Secondary | ICD-10-CM

## 2011-02-21 DIAGNOSIS — M549 Dorsalgia, unspecified: Secondary | ICD-10-CM

## 2011-02-21 DIAGNOSIS — S43429A Sprain of unspecified rotator cuff capsule, initial encounter: Secondary | ICD-10-CM

## 2011-02-21 MED ORDER — METHOCARBAMOL 500 MG PO TABS: 500.0000 mg | ORAL_TABLET | Freq: Four times a day (QID) | ORAL | Status: DC

## 2011-02-21 MED ORDER — GABAPENTIN 100 MG PO CAPS: 100.0000 mg | ORAL_CAPSULE | Freq: Three times a day (TID) | ORAL | Status: DC

## 2011-02-21 NOTE — Patient Instructions (Signed)
You will take robaxin and neurontin for your back   Continue heat application

## 2011-02-21 NOTE — Progress Notes (Signed)
Status post rotator cuff repair, with reinjury pain in the shoulder has resolved. Patient complains of RIGHT sided lower back pain and RIGHT knee pain. X-rays were taken of her knee in the past. She has arthritis.  We discussed possible repeat epidural injections, as well as physical therapy. She is not anxious to have epidural space on the new problems that have been hand with the medications used for that and she would like to continue with heat at home we also added a Lidoderm patch.  I placed her on Neurontin. I also gave her some Robaxin to take.

## 2011-03-02 ENCOUNTER — Other Ambulatory Visit: Payer: Self-pay | Admitting: *Deleted

## 2011-03-02 MED ORDER — LIDOCAINE 5 % EX PTCH: 1.0000 | MEDICATED_PATCH | CUTANEOUS | Status: AC

## 2011-03-20 ENCOUNTER — Other Ambulatory Visit: Payer: Self-pay | Admitting: *Deleted

## 2011-03-20 MED ORDER — HYDROCODONE-ACETAMINOPHEN 5-325 MG PO TABS: 1.0000 | ORAL_TABLET | ORAL | Status: DC | PRN

## 2011-05-12 ENCOUNTER — Other Ambulatory Visit: Payer: Self-pay | Admitting: *Deleted

## 2011-05-12 MED ORDER — HYDROCODONE-ACETAMINOPHEN 5-325 MG PO TABS: ORAL_TABLET | ORAL | Status: DC

## 2011-05-24 ENCOUNTER — Encounter: Payer: Self-pay | Admitting: Gastroenterology

## 2011-09-18 ENCOUNTER — Other Ambulatory Visit: Payer: Self-pay | Admitting: *Deleted

## 2011-09-18 MED ORDER — HYDROCODONE-ACETAMINOPHEN 5-325 MG PO TABS: ORAL_TABLET | ORAL | Status: DC

## 2011-11-01 ENCOUNTER — Other Ambulatory Visit (HOSPITAL_COMMUNITY): Payer: Self-pay | Admitting: Family Medicine

## 2011-11-01 DIAGNOSIS — L539 Erythematous condition, unspecified: Secondary | ICD-10-CM

## 2011-11-01 DIAGNOSIS — N63 Unspecified lump in unspecified breast: Secondary | ICD-10-CM

## 2011-11-08 ENCOUNTER — Ambulatory Visit (HOSPITAL_COMMUNITY)
Admission: RE | Admit: 2011-11-08 | Discharge: 2011-11-08 | Disposition: A | Discharge status: Home / Self Care | Payer: Medicare Other | Source: Ambulatory Visit | Attending: Family Medicine | Admitting: Family Medicine

## 2011-11-08 ENCOUNTER — Other Ambulatory Visit (HOSPITAL_COMMUNITY): Payer: Self-pay | Admitting: Family Medicine

## 2011-11-08 DIAGNOSIS — L539 Erythematous condition, unspecified: Secondary | ICD-10-CM

## 2011-11-08 DIAGNOSIS — N63 Unspecified lump in unspecified breast: Secondary | ICD-10-CM | POA: Insufficient documentation

## 2011-11-13 ENCOUNTER — Telehealth: Payer: Self-pay | Admitting: Orthopedic Surgery

## 2011-11-13 NOTE — Telephone Encounter (Signed)
[name] came in the office today asking to be seen for severe back pain.  I told her Dr. Romeo Apple will not be in the office until this afternoon and his schedule is full.  Advised her to go to the ER and she said she is going to Dr. Ozzie Hoyle office to see if she will see her.

## 2011-11-13 NOTE — Telephone Encounter (Signed)
Cant see any more patients today

## 2011-11-13 NOTE — Telephone Encounter (Signed)
Spoke with Winfield Rast, she saw Dr. Eduard Clos this morning and has been scheduled  for a procedure 11/14/11

## 2011-11-13 NOTE — Telephone Encounter (Signed)
Next available?

## 2012-03-13 ENCOUNTER — Other Ambulatory Visit: Payer: Self-pay | Admitting: Orthopedic Surgery

## 2012-03-13 DIAGNOSIS — M25569 Pain in unspecified knee: Secondary | ICD-10-CM

## 2012-03-13 MED ORDER — HYDROCODONE-ACETAMINOPHEN 5-325 MG PO TABS: ORAL_TABLET | ORAL | Status: DC

## 2012-05-13 ENCOUNTER — Ambulatory Visit: Payer: Self-pay | Admitting: Pain Medicine

## 2012-06-04 ENCOUNTER — Other Ambulatory Visit: Payer: Self-pay | Admitting: *Deleted

## 2012-06-04 MED ORDER — HYDROCODONE-ACETAMINOPHEN 5-325 MG PO TABS: ORAL_TABLET | ORAL | Status: DC

## 2012-07-10 ENCOUNTER — Ambulatory Visit: Payer: Self-pay | Admitting: Pain Medicine

## 2012-07-10 DIAGNOSIS — E119 Type 2 diabetes mellitus without complications: Secondary | ICD-10-CM

## 2012-07-10 LAB — BASIC METABOLIC PANEL
BUN: 22 mg/dL — ABNORMAL HIGH (ref 7–18)
Creatinine: 1.37 mg/dL — ABNORMAL HIGH (ref 0.60–1.30)
EGFR (African American): 44 — ABNORMAL LOW
EGFR (Non-African Amer.): 38 — ABNORMAL LOW
Osmolality: 289 (ref 275–301)
Sodium: 142 mmol/L (ref 136–145)

## 2012-07-10 LAB — CBC WITH DIFFERENTIAL/PLATELET
Basophil %: 0.7 %
Eosinophil #: 0 10*3/uL (ref 0.0–0.7)
HCT: 36.3 % (ref 35.0–47.0)
HGB: 11.6 g/dL — ABNORMAL LOW (ref 12.0–16.0)
Lymphocyte #: 2.5 10*3/uL (ref 1.0–3.6)
MCH: 27.5 pg (ref 26.0–34.0)
MCHC: 32.1 g/dL (ref 32.0–36.0)
MCV: 86 fL (ref 80–100)
Monocyte %: 4.7 %
Neutrophil #: 6.6 10*3/uL — ABNORMAL HIGH (ref 1.4–6.5)
Platelet: 213 10*3/uL (ref 150–440)
RBC: 4.23 10*6/uL (ref 3.80–5.20)

## 2012-07-16 ENCOUNTER — Ambulatory Visit: Payer: Self-pay | Admitting: Pain Medicine

## 2012-07-25 ENCOUNTER — Ambulatory Visit: Payer: Self-pay | Admitting: Pain Medicine

## 2012-07-31 ENCOUNTER — Ambulatory Visit: Payer: Self-pay | Admitting: Pain Medicine

## 2012-11-03 ENCOUNTER — Encounter (HOSPITAL_COMMUNITY): Payer: Self-pay

## 2012-11-03 ENCOUNTER — Emergency Department (HOSPITAL_COMMUNITY)
Admission: EM | Admit: 2012-11-03 | Discharge: 2012-11-03 | Disposition: A | Discharge status: Home / Self Care | Payer: Medicare Other | Attending: Emergency Medicine | Admitting: Emergency Medicine

## 2012-11-03 DIAGNOSIS — L02214 Cutaneous abscess of groin: Secondary | ICD-10-CM

## 2012-11-03 DIAGNOSIS — K219 Gastro-esophageal reflux disease without esophagitis: Secondary | ICD-10-CM | POA: Insufficient documentation

## 2012-11-03 DIAGNOSIS — F329 Major depressive disorder, single episode, unspecified: Secondary | ICD-10-CM | POA: Insufficient documentation

## 2012-11-03 DIAGNOSIS — Z87891 Personal history of nicotine dependence: Secondary | ICD-10-CM | POA: Insufficient documentation

## 2012-11-03 DIAGNOSIS — M129 Arthropathy, unspecified: Secondary | ICD-10-CM | POA: Insufficient documentation

## 2012-11-03 DIAGNOSIS — Z79899 Other long term (current) drug therapy: Secondary | ICD-10-CM | POA: Insufficient documentation

## 2012-11-03 DIAGNOSIS — G5600 Carpal tunnel syndrome, unspecified upper limb: Secondary | ICD-10-CM | POA: Insufficient documentation

## 2012-11-03 DIAGNOSIS — Z8669 Personal history of other diseases of the nervous system and sense organs: Secondary | ICD-10-CM | POA: Insufficient documentation

## 2012-11-03 DIAGNOSIS — Z8719 Personal history of other diseases of the digestive system: Secondary | ICD-10-CM | POA: Insufficient documentation

## 2012-11-03 DIAGNOSIS — Z8679 Personal history of other diseases of the circulatory system: Secondary | ICD-10-CM | POA: Insufficient documentation

## 2012-11-03 DIAGNOSIS — Z860100 Personal history of colon polyps, unspecified: Secondary | ICD-10-CM | POA: Insufficient documentation

## 2012-11-03 DIAGNOSIS — F411 Generalized anxiety disorder: Secondary | ICD-10-CM | POA: Insufficient documentation

## 2012-11-03 DIAGNOSIS — Z8673 Personal history of transient ischemic attack (TIA), and cerebral infarction without residual deficits: Secondary | ICD-10-CM | POA: Insufficient documentation

## 2012-11-03 DIAGNOSIS — I10 Essential (primary) hypertension: Secondary | ICD-10-CM | POA: Insufficient documentation

## 2012-11-03 DIAGNOSIS — Z87440 Personal history of urinary (tract) infections: Secondary | ICD-10-CM | POA: Insufficient documentation

## 2012-11-03 DIAGNOSIS — Z87448 Personal history of other diseases of urinary system: Secondary | ICD-10-CM | POA: Insufficient documentation

## 2012-11-03 DIAGNOSIS — J45909 Unspecified asthma, uncomplicated: Secondary | ICD-10-CM | POA: Insufficient documentation

## 2012-11-03 DIAGNOSIS — E119 Type 2 diabetes mellitus without complications: Secondary | ICD-10-CM | POA: Insufficient documentation

## 2012-11-03 DIAGNOSIS — L03319 Cellulitis of trunk, unspecified: Secondary | ICD-10-CM | POA: Insufficient documentation

## 2012-11-03 DIAGNOSIS — F3289 Other specified depressive episodes: Secondary | ICD-10-CM | POA: Insufficient documentation

## 2012-11-03 DIAGNOSIS — L02219 Cutaneous abscess of trunk, unspecified: Secondary | ICD-10-CM | POA: Insufficient documentation

## 2012-11-03 MED ORDER — SULFAMETHOXAZOLE-TRIMETHOPRIM 800-160 MG PO TABS: 1.0000 | ORAL_TABLET | Freq: Two times a day (BID) | ORAL | Status: DC

## 2012-11-03 MED ORDER — LIDOCAINE HCL (PF) 1 % IJ SOLN
INTRAMUSCULAR | Status: AC
  Filled 2012-11-03: qty 5

## 2012-11-03 MED ORDER — LIDOCAINE HCL (PF) 1 % IJ SOLN: 5.0000 mL | Freq: Once | INTRAMUSCULAR | Status: DC

## 2012-11-03 NOTE — ED Notes (Signed)
Abscess noted to right groin since yesterday, denies fever, chills, or N/V

## 2012-11-03 NOTE — ED Provider Notes (Signed)
CSN: [id number]     Arrival date & time 11/03/12  1323 History    This chart was scribed for Loren Racer, MD by Quintella Reichert, ED scribe.  This patient was seen in room APA15/APA15 and the patient's care was started at 1:55 PM.     Chief Complaint  Patient presents with  . Abscess    The history is provided by the patient. No language interpreter was used.    HPI Comments: [name] is a 72 y.o. female who presents to the Emergency Department complaining of a large abscess to the right groin area that she first noticed yesterday.  Pt describes abscess as "huge" with associated constant moderate pain radiating down her leg.  Abscess began draining after admission to the ED and presently she reports that pain is significantly improved.  Pt has h/o similar abscesses which she has had drained in the ED.  She notes that she has also been treated with antibiotics in the past.  She denies fever, chills, nausea, emesis, or any other associated symptoms.   Past Medical History  Diagnosis Date  . Asthma   . Depression   . Diabetes mellitus, type 2   . GERD (gastroesophageal reflux disease)   . Hypertension   . Low back pain   . IBS (irritable bowel syndrome)   . History of recurrent UTIs   . Cataract   . Glaucoma   . Arthritis   . Hx of migraines   . Carpal tunnel syndrome   . Morbid obesity   . Sleep apnea     Intolerant to CPAP   . Hx of colonic polyps   . Anxiety   . Carotid artery aneurysm     Right s/p endovascular treatment 2004  . Neurogenic bladder   . Stroke    Past Surgical History  Procedure Laterality Date  . Vesicovaginal fistula closure w/ tah    . Carpal tunnel release - bilateral  1992  . Total hip replacement - right  2002  . Stomach surgery gastropexy for gastric volvulus  2009  . Cerebral aneurysm repair  2004    Coil   . Tubular adenoma    . Shoulder surgery      rotator cuff repair, 2012  . Spinal cord stimulator insertion     Family  History  Problem Relation Age of Onset  . Heart disease Mother   . Cancer Mother   . Diabetes Brother   . Cancer Brother     Prostate   . Cancer Brother     Pancreatic   . Cancer Sister     Breast   . Heart attack Sister   . Lung disease    . Asthma     History  Substance Use Topics  . Smoking status: Former Smoker    Types: Cigarettes  . Smokeless tobacco: Never Used  . Alcohol Use: No   OB History   Grav Para Term Preterm Abortions TAB SAB Ect Mult Living                 Review of Systems  All other systems reviewed and are negative.    Allergies  Review of patient's allergies indicates no known allergies.  Home Medications   Current Outpatient Rx  Name  Route  Sig  Dispense  Refill  . doxepin (SINEQUAN) 25 MG capsule   Oral   Take 100 mg by mouth at bedtime.          Marland Kitchen  glimepiride (AMARYL) 4 MG tablet   Oral   Take 4 mg by mouth daily.          . metFORMIN (GLUCOPHAGE) 500 MG tablet   Oral   Take 500 mg by mouth 2 (two) times daily with a meal.           . methocarbamol (ROBAXIN) 500 MG tablet   Oral   Take 500 mg by mouth 2 (two) times daily as needed (muscle spasm).         Marland Kitchen metoCLOPramide (REGLAN) 5 MG tablet   Oral   Take 5 mg by mouth at bedtime.           Marland Kitchen omeprazole (PRILOSEC) 20 MG capsule   Oral   Take 20 mg by mouth 2 (two) times daily.          . promethazine (PHENERGAN) 25 MG tablet   Oral   Take 25 mg by mouth 2 (two) times daily as needed for nausea.          . ASTEPRO 0.15 % SOLN   Nasal   Place 1 spray into the nose as needed.          . furosemide (LASIX) 20 MG tablet   Oral   Take 20 mg by mouth daily as needed for fluid. Take one tablet by mouth as needed         . gabapentin (NEURONTIN) 100 MG capsule   Oral   Take 100 mg by mouth 2 (two) times daily as needed (pain).         Marland Kitchen HYDROcodone-acetaminophen (NORCO/VICODIN) 5-325 MG per tablet      Take 1 to 2 tablets by mouth every 4 to 6 hous as  needed   42 tablet   5   . ibuprofen (ADVIL,MOTRIN) 800 MG tablet   Oral   Take 1 tablet (800 mg total) by mouth every 8 (eight) hours as needed for pain.   60 tablet   1   . meclizine (ANTIVERT) 25 MG tablet   Oral   Take 25 mg by mouth 3 (three) times daily as needed (vertigo).          . naproxen sodium (ALEVE) 220 MG tablet   Oral   Take 220 mg by mouth as needed (pain).          . NASONEX 50 MCG/ACT nasal spray   Nasal   Place 2 sprays into the nose daily as needed (allergies).          Marland Kitchen sulfamethoxazole-trimethoprim (SEPTRA DS) 800-160 MG per tablet   Oral   Take 1 tablet by mouth every 12 (twelve) hours.   14 tablet   0    BP 141/106  Pulse 98  Temp(Src) 98.9 F (37.2 C) (Oral)  Resp 16  Ht 5' (1.524 m)  Wt 230 lb (104.327 kg)  BMI 44.92 kg/m2  SpO2 98%  Physical Exam  Nursing note and vitals reviewed. Constitutional: She is oriented to person, place, and time. She appears well-developed and well-nourished. No distress.  Well-appearing.  HENT:  Head: Normocephalic and atraumatic.  Eyes: EOM are normal.  Neck: Neck supple. No tracheal deviation present.  Cardiovascular: Normal rate.   Pulmonary/Chest: Effort normal. No respiratory distress.  Musculoskeletal: Normal range of motion.  Neurological: She is alert and oriented to person, place, and time.  Skin: Skin is warm and dry.  Right inguinal fold induration, erythema, and warmth.  Spontaneously draining abscess material.  Psychiatric: She has a normal mood and affect. Her behavior is normal.    ED Course  Procedures (including critical care time)  DIAGNOSTIC STUDIES: Oxygen Saturation is 98% on room air, normal by my interpretation.    COORDINATION OF CARE: 1:58 PM-Drained most of the abscess material at bedside.  Discussed treatment plan which includes [place], antibiotics and f/u at ED or PCP's office with pt at bedside and pt agreed to plan.  2:03 PM: Small fluid collection noted on [place].   WiIl perform I&D.  Pt agrees to plan.  INCISION AND DRAINAGE PROCEDURE NOTE: Patient identification was confirmed and verbal consent was obtained. This procedure was performed by Loren Racer, MD at 2:57 PM. Site: Right inguinal region Sterile procedures observed Anesthetic used (type and amt): 1% lidocaine w/o epinephrine Blade size: 11 Drainage: Small amount Complexity: Complex Site anesthetized, incision made over site, wound drained and explored loculations, rinsed with copious amounts of normal [name], covered with dry, sterile dressing.  Pt tolerated procedure well without complications.  Instructions for care discussed verbally and pt provided with additional written instructions for homecare and f/u.   Labs Reviewed - No data to display  No results found.  1. Inguinal abscess     MDM  I personally performed the services described in this documentation, which was scribed in my presence. The recorded information has been reviewed and is accurate.     Loren Racer, MD 11/04/12 [phone]

## 2012-11-03 NOTE — ED Notes (Signed)
Complain of boil in right groin area. States she has pain going down her leg

## 2013-01-20 ENCOUNTER — Other Ambulatory Visit (HOSPITAL_COMMUNITY): Payer: Self-pay | Admitting: Family Medicine

## 2013-01-20 ENCOUNTER — Other Ambulatory Visit (HOSPITAL_COMMUNITY): Payer: Self-pay | Admitting: Internal Medicine

## 2013-01-20 DIAGNOSIS — M810 Age-related osteoporosis without current pathological fracture: Secondary | ICD-10-CM

## 2013-01-21 ENCOUNTER — Telehealth: Payer: Self-pay

## 2013-01-21 NOTE — Telephone Encounter (Signed)
Pt was referred by Dr. [name] for colonoscopy. Last one was with Dr. Darrick Penna on 06/14/2006. Hx of tubular adenoma. OV with Gerrit Halls, NP on 02/06/2013 at 8:30 AM.

## 2013-01-27 ENCOUNTER — Ambulatory Visit (HOSPITAL_COMMUNITY)
Admission: RE | Admit: 2013-01-27 | Discharge: 2013-01-27 | Disposition: A | Discharge status: Home / Self Care | Payer: Medicare Other | Source: Ambulatory Visit | Attending: Internal Medicine | Admitting: Internal Medicine

## 2013-01-27 DIAGNOSIS — M810 Age-related osteoporosis without current pathological fracture: Secondary | ICD-10-CM | POA: Insufficient documentation

## 2013-02-06 ENCOUNTER — Ambulatory Visit: Payer: Medicare Other | Admitting: Gastroenterology

## 2013-02-18 ENCOUNTER — Ambulatory Visit: Payer: Medicare Other | Admitting: Orthopedic Surgery

## 2013-02-18 ENCOUNTER — Encounter: Payer: Self-pay | Admitting: Orthopedic Surgery

## 2013-02-19 ENCOUNTER — Ambulatory Visit: Payer: Commercial Managed Care - HMO | Admitting: Gastroenterology

## 2013-02-21 ENCOUNTER — Encounter (INDEPENDENT_AMBULATORY_CARE_PROVIDER_SITE_OTHER): Payer: Self-pay

## 2013-02-21 ENCOUNTER — Ambulatory Visit (INDEPENDENT_AMBULATORY_CARE_PROVIDER_SITE_OTHER): Payer: Medicare Other | Admitting: Gastroenterology

## 2013-02-21 ENCOUNTER — Encounter: Payer: Self-pay | Admitting: Gastroenterology

## 2013-02-21 ENCOUNTER — Other Ambulatory Visit: Payer: Self-pay | Admitting: Gastroenterology

## 2013-02-21 VITALS — BP 139/81 | HR 90 | Temp 97.8°F | Ht 60.0 in | Wt 235.0 lb

## 2013-02-21 DIAGNOSIS — Z860100 Personal history of colon polyps, unspecified: Secondary | ICD-10-CM | POA: Insufficient documentation

## 2013-02-21 MED ORDER — PEG 3350-KCL-NA BICARB-NACL 420 G PO SOLR: 4000.0000 mL | ORAL | Status: DC

## 2013-02-21 NOTE — Patient Instructions (Addendum)
We have scheduled you for a colonoscopy with Dr. Darrick Penna in the near future.  Do not take any diabetes medication the day of the procedure.  The evening before, only take 1/2 dose of Metformin.  Have a wonderful holiday season!  Start taking a probiotic daily such as Digestive Advantage, Phillip's Colon Health, [employer] brand, Align, Restora.

## 2013-02-21 NOTE — Progress Notes (Signed)
Cc PCP 

## 2013-02-21 NOTE — Progress Notes (Signed)
Primary Care Physician:  Milana Obey, MD Primary Gastroenterologist:  Dr. Darrick Penna   Chief Complaint  Patient presents with  . Colonoscopy    HPI:   ADRYAN DRUCKENMILLER presents today to schedule a surveillance colonoscopy. Last lower GI evaluation in 2008 with tubular adenoma. History of chronic GERD and chronic diarrhea.  States significant joint pain, diagnosed with osteoporosis recently. No rectal bleeding, changes in bowel habits. Every so often will have loose stool about 2-3 times in one day but then "levels right out". Notes a lot of gas. No probiotic. No abdominal pain, nausea, vomiting. GERD controlled.    Past Medical History  Diagnosis Date  . Asthma   . Depression   . Diabetes mellitus, type 2   . GERD (gastroesophageal reflux disease)   . Hypertension   . Low back pain   . IBS (irritable bowel syndrome)   . History of recurrent UTIs   . Cataract   . Glaucoma   . Arthritis   . Hx of migraines   . Carpal tunnel syndrome   . Morbid obesity   . Sleep apnea     Intolerant to CPAP   . Hx of colonic polyps   . Anxiety   . Carotid artery aneurysm     Right s/p endovascular treatment 2004  . Neurogenic bladder   . Stroke   . Osteoporosis   . Hypothyroidism     Past Surgical History  Procedure Laterality Date  . Vesicovaginal fistula closure w/ tah    . Carpal tunnel release - bilateral  1992  . Total hip replacement - right  2002  . Stomach surgery gastropexy for gastric volvulus  2009  . Cerebral aneurysm repair  2004    Coil   . Tubular adenoma    . Shoulder surgery      rotator cuff repair, 2012  . Spinal cord stimulator insertion    . Colonoscopy  06/14/2006    SLF:two 3 mm cecal polyps/pancolonic diverticulosis/sigmoid colon  . Esophagogastroduodenoscopy  08/12/2007    ZOX:WRUEAV esophagus without evidence of Barrett's,mass, erosion ulceration or stricture.  The GE junction was at 40 cm.  The Bravo  capsule was placed at 34 cm/ Unable to  appreciate a hiatal hernia in the retroflexed viewNormal duodenal bulb and second portion of the duodenum    Current Outpatient Prescriptions  Medication Sig Dispense Refill  . alendronate (FOSAMAX) 70 MG tablet       . ASTEPRO 0.15 % SOLN Place 1 spray into the nose as needed.       . doxepin (SINEQUAN) 25 MG capsule Take 100 mg by mouth at bedtime.       . furosemide (LASIX) 20 MG tablet Take 20 mg by mouth daily as needed for fluid. Take one tablet by mouth as needed      . gabapentin (NEURONTIN) 100 MG capsule Take 100 mg by mouth 2 (two) times daily as needed (pain).      Marland Kitchen glimepiride (AMARYL) 4 MG tablet Take 4 mg by mouth daily.       Marland Kitchen ibuprofen (ADVIL,MOTRIN) 800 MG tablet Take 1 tablet (800 mg total) by mouth every 8 (eight) hours as needed for pain.  60 tablet  1  . levothyroxine (SYNTHROID, LEVOTHROID) 25 MCG tablet       . meclizine (ANTIVERT) 25 MG tablet Take 25 mg by mouth 3 (three) times daily as needed (vertigo).       . metFORMIN (GLUCOPHAGE) 500 MG tablet  Take 500 mg by mouth 2 (two) times daily with a meal.        . methocarbamol (ROBAXIN) 500 MG tablet Take 500 mg by mouth 2 (two) times daily as needed (muscle spasm).      . naproxen sodium (ALEVE) 220 MG tablet Take 220 mg by mouth as needed (pain).       . NASONEX 50 MCG/ACT nasal spray Place 2 sprays into the nose daily as needed (allergies).       Marland Kitchen omeprazole (PRILOSEC) 20 MG capsule Take 20 mg by mouth 2 (two) times daily.       . promethazine (PHENERGAN) 25 MG tablet Take 25 mg by mouth 2 (two) times daily as needed for nausea.        No current facility-administered medications for this visit.    Allergies as of 02/21/2013  . (No Known Allergies)    Family History  Problem Relation Age of Onset  . Heart disease Mother   . Cancer Mother   . Diabetes Brother   . Cancer Brother     Prostate   . Cancer Brother     Pancreatic   . Cancer Sister     Breast   . Heart attack Sister   . Lung disease    .  Asthma    . Colon cancer Neg Hx     History   Social History  . Marital Status: Married    Spouse Name: N/A    Number of Children: N/A  . Years of Education: 12th grade   Occupational History  . Retired     [employer]  .     Social History Main Topics  . Smoking status: Former Smoker    Types: Cigarettes  . Smokeless tobacco: Never Used  . Alcohol Use: No  . Drug Use: No  . Sexual Activity: Not on file   Other Topics Concern  . Not on file   Social History Narrative  . No narrative on file    Review of Systems: Negative unless mentioned in HPI.   Physical Exam: BP 139/81  Pulse 90  Temp(Src) 97.8 F (36.6 C) (Oral)  Ht 5' (1.524 m)  Wt 235 lb (106.595 kg)  BMI 45.90 kg/m2 General:   Alert and oriented. Pleasant and cooperative. Well-nourished and well-developed.  Head:  Normocephalic and atraumatic. Eyes:  Without icterus, sclera clear and conjunctiva pink.  Ears:  Normal auditory acuity. Nose:  No deformity, discharge,  or lesions. Mouth:  No deformity or lesions, oral mucosa pink.  Neck:  Supple, without mass or thyromegaly. Lungs:  Clear to auscultation bilaterally. No wheezes, rales, or rhonchi. No distress.  Heart:  S1, S2 present without murmurs appreciated.  Abdomen:  +BS, soft, non-tender and non-distended. No HSM noted. No guarding or rebound. No masses appreciated.  Rectal:  Deferred  Msk:  Symmetrical without gross deformities. Normal posture. Extremities:  Without clubbing or edema. Neurologic:  Alert and  oriented x4;  grossly normal neurologically. Skin:  Intact without significant lesions or rashes. Cervical Nodes:  No significant cervical adenopathy. Psych:  Alert and cooperative. Normal mood and affect.

## 2013-02-21 NOTE — Assessment & Plan Note (Signed)
72 year old female with history of tubular adenoma in 2008, now due for surveillance colonoscopy. No concerning upper or lower GI symptoms. Notes occasional gas, and I have recommend probiotics OTC.   Proceed with colonoscopy with Dr. Darrick Penna in the near future. The risks, benefits, and alternatives have been discussed in detail with the patient. They state understanding and desire to proceed.  1/2 dose of Glucophage the evening prior No diabetes medication the day of

## 2013-02-25 ENCOUNTER — Encounter (HOSPITAL_COMMUNITY): Payer: Self-pay | Admitting: Pharmacy Technician

## 2013-03-03 ENCOUNTER — Encounter (HOSPITAL_COMMUNITY): Payer: Self-pay | Admitting: Pharmacy Technician

## 2013-03-03 NOTE — Progress Notes (Signed)
Per [facility] pt needs to be moved to 1:30 for procedure on 12/2 and arrive at 12:30. Pt is aware and also made Hollie Salk in Endo aware of this schedule change.

## 2013-03-04 ENCOUNTER — Encounter (HOSPITAL_COMMUNITY)
Admission: RE | Disposition: A | Discharge status: Home / Self Care | Payer: Self-pay | Source: Ambulatory Visit | Attending: Gastroenterology

## 2013-03-04 ENCOUNTER — Encounter (HOSPITAL_COMMUNITY): Payer: Self-pay | Admitting: *Deleted

## 2013-03-04 ENCOUNTER — Ambulatory Visit (HOSPITAL_COMMUNITY)
Admission: RE | Admit: 2013-03-04 | Discharge: 2013-03-04 | Disposition: A | Discharge status: Home / Self Care | Payer: Medicare Other | Source: Ambulatory Visit | Attending: Gastroenterology | Admitting: Gastroenterology

## 2013-03-04 DIAGNOSIS — Z860100 Personal history of colon polyps, unspecified: Secondary | ICD-10-CM | POA: Insufficient documentation

## 2013-03-04 DIAGNOSIS — K648 Other hemorrhoids: Secondary | ICD-10-CM | POA: Insufficient documentation

## 2013-03-04 DIAGNOSIS — I10 Essential (primary) hypertension: Secondary | ICD-10-CM | POA: Insufficient documentation

## 2013-03-04 DIAGNOSIS — Z01812 Encounter for preprocedural laboratory examination: Secondary | ICD-10-CM | POA: Insufficient documentation

## 2013-03-04 DIAGNOSIS — E119 Type 2 diabetes mellitus without complications: Secondary | ICD-10-CM | POA: Insufficient documentation

## 2013-03-04 DIAGNOSIS — K5730 Diverticulosis of large intestine without perforation or abscess without bleeding: Secondary | ICD-10-CM | POA: Insufficient documentation

## 2013-03-04 DIAGNOSIS — Z1211 Encounter for screening for malignant neoplasm of colon: Secondary | ICD-10-CM

## 2013-03-04 HISTORY — PX: COLONOSCOPY: SHX5424

## 2013-03-04 LAB — GLUCOSE, CAPILLARY: Glucose-Capillary: 145 mg/dL — ABNORMAL HIGH (ref 70–99)

## 2013-03-04 SURGERY — COLONOSCOPY
Anesthesia: Moderate Sedation

## 2013-03-04 MED ORDER — MIDAZOLAM HCL 5 MG/5ML IJ SOLN
INTRAMUSCULAR | Status: DC | PRN
  Administered 2013-03-04 (×2): 2 mg via INTRAVENOUS

## 2013-03-04 MED ORDER — MIDAZOLAM HCL 5 MG/5ML IJ SOLN
INTRAMUSCULAR | Status: AC
  Filled 2013-03-04: qty 10

## 2013-03-04 MED ORDER — STERILE WATER FOR IRRIGATION IR SOLN
Status: DC | PRN
  Administered 2013-03-04: 13:00:00

## 2013-03-04 MED ORDER — MEPERIDINE HCL 100 MG/ML IJ SOLN
INTRAMUSCULAR | Status: AC
  Filled 2013-03-04: qty 2

## 2013-03-04 MED ORDER — MEPERIDINE HCL 100 MG/ML IJ SOLN
INTRAMUSCULAR | Status: DC | PRN
  Administered 2013-03-04 (×2): 25 mg via INTRAVENOUS

## 2013-03-04 MED ORDER — SODIUM CHLORIDE 0.9 % IV SOLN
INTRAVENOUS | Status: DC
  Administered 2013-03-04: 13:00:00 via INTRAVENOUS

## 2013-03-04 NOTE — H&P (Signed)
Primary Care Physician:  Milana Obey, MD Primary Gastroenterologist:  Dr. Darrick Penna  Pre-Procedure History & Physical: HPI:  [name] is a 72 y.o. female here for  PERSONAL HISTORY OF POLYPS.  Past Medical History  Diagnosis Date  . Asthma   . Depression   . Diabetes mellitus, type 2   . GERD (gastroesophageal reflux disease)   . Hypertension   . Low back pain   . IBS (irritable bowel syndrome)   . History of recurrent UTIs   . Cataract   . Glaucoma   . Arthritis   . Hx of migraines   . Carpal tunnel syndrome   . Morbid obesity   . Sleep apnea     Intolerant to CPAP   . Hx of colonic polyps   . Anxiety   . Carotid artery aneurysm     Right s/p endovascular treatment 2004  . Neurogenic bladder   . Stroke   . Osteoporosis   . Hypothyroidism     Past Surgical History  Procedure Laterality Date  . Vesicovaginal fistula closure w/ tah    . Carpal tunnel release - bilateral  1992  . Total hip replacement - right  2002  . Stomach surgery gastropexy for gastric volvulus  2009  . Cerebral aneurysm repair  2004    Coil   . Tubular adenoma    . Shoulder surgery      rotator cuff repair, 2012  . Spinal cord stimulator insertion    . Colonoscopy  06/14/2006    SLF:two 3 mm cecal polyps/pancolonic diverticulosis/sigmoid colon lipoma, tubular adenoma.   . Esophagogastroduodenoscopy  08/12/2007    NWG:NFAOZH esophagus without evidence of Barrett's,mass, erosion ulceration or stricture.  The GE junction was at 40 cm.  The Bravo  capsule was placed at 34 cm/ Unable to appreciate a hiatal hernia in the retroflexed viewNormal duodenal bulb and second portion of the duodenum. mild gastritis.     Prior to Admission medications   Medication Sig Start Date End Date Taking? Authorizing Provider  ibuprofen (ADVIL,MOTRIN) 800 MG tablet Take 1 tablet (800 mg total) by mouth every 8 (eight) hours as needed for pain. 01/03/11  Yes Vickki Hearing, MD  metFORMIN (GLUCOPHAGE)  500 MG tablet Take 500 mg by mouth 2 (two) times daily with a meal.     Yes Historical Provider, MD  omeprazole (PRILOSEC) 20 MG capsule Take 20 mg by mouth 2 (two) times daily.    Yes Historical Provider, MD  polyethylene glycol-electrolytes (TRILYTE) 420 G solution Take 4,000 mLs by mouth as directed. 02/21/13  Yes West Bali, MD  alendronate (FOSAMAX) 70 MG tablet  02/19/13   Historical Provider, MD  ASTEPRO 0.15 % SOLN Place 1 spray into the nose as needed.  05/17/10   Historical Provider, MD  doxepin (SINEQUAN) 25 MG capsule Take 100 mg by mouth at bedtime.  06/30/10   Historical Provider, MD  furosemide (LASIX) 20 MG tablet Take 20 mg by mouth daily as needed for fluid. Take one tablet by mouth as needed    Historical Provider, MD  gabapentin (NEURONTIN) 100 MG capsule Take 100 mg by mouth 2 (two) times daily as needed (pain).    Historical Provider, MD  glimepiride (AMARYL) 4 MG tablet Take 4 mg by mouth daily.  06/30/10   Historical Provider, MD  levothyroxine (SYNTHROID, LEVOTHROID) 25 MCG tablet Take 25 mcg by mouth daily before breakfast.  01/27/13   Historical Provider, MD  meclizine (ANTIVERT) 25  MG tablet Take 25 mg by mouth 3 (three) times daily as needed (vertigo).  10/26/10   Historical Provider, MD  methocarbamol (ROBAXIN) 500 MG tablet Take 500 mg by mouth 2 (two) times daily as needed (muscle spasm).    Historical Provider, MD  naproxen sodium (ALEVE) 220 MG tablet Take 220 mg by mouth as needed (pain).     Historical Provider, MD  NASONEX 50 MCG/ACT nasal spray Place 2 sprays into the nose daily as needed (allergies).  05/17/10   Historical Provider, MD  promethazine (PHENERGAN) 25 MG tablet Take 25 mg by mouth 2 (two) times daily as needed for nausea.  07/24/10   Historical Provider, MD    Allergies as of 02/21/2013  . (No Known Allergies)    Family History  Problem Relation Age of Onset  . Heart disease Mother   . Cancer Mother   . Diabetes Brother   . Cancer Brother      Prostate   . Cancer Brother     Pancreatic   . Cancer Sister     Breast   . Heart attack Sister   . Lung disease    . Asthma    . Colon cancer Neg Hx     History   Social History  . Marital Status: Married    Spouse Name: N/A    Number of Children: N/A  . Years of Education: 12th grade   Occupational History  . Retired     [employer]  .     Social History Main Topics  . Smoking status: Former Smoker    Types: Cigarettes  . Smokeless tobacco: Never Used  . Alcohol Use: No  . Drug Use: No  . Sexual Activity: Not on file   Other Topics Concern  . Not on file   Social History Narrative  . No narrative on file    Review of Systems: See HPI, otherwise negative ROS   Physical Exam: BP 160/88  Pulse 72  Temp(Src) 98.1 F (36.7 C) (Oral)  Resp 20  Ht 5' (1.524 m)  Wt 235 lb (106.595 kg)  BMI 45.90 kg/m2  SpO2 98% General:   Alert,  pleasant and cooperative in NAD Head:  Normocephalic and atraumatic. Neck:  Supple; Lungs:  Clear throughout to auscultation.    Heart:  Regular rate and rhythm. Abdomen:  Soft, nontender and nondistended. Normal bowel sounds, without guarding, and without rebound.   Neurologic:  Alert and  oriented x4;  grossly normal neurologically.  Impression/Plan:    PERSONAL HISTORY OF POLYPS.  PLAN:  1. TCS TODAY

## 2013-03-04 NOTE — Progress Notes (Signed)
REVIEWED.  

## 2013-03-04 NOTE — Op Note (Signed)
[facility] [address] [place], [address]   COLONOSCOPY PROCEDURE REPORT  PATIENT: [name], [name]  MR#: [id number] BIRTHDATE: 03/31/1941 , 72  yrs. old GENDER: Female ENDOSCOPIST: Jonette Eva, MD REFERRED VH:QIONG [name], M.D. PROCEDURE DATE:  03/04/2013 PROCEDURE:   Colonoscopy, screening INDICATIONS:Patient's personal history of adenomatous colon polyps(2008: 2 simple adenomas < 1 cm). MEDICATIONS: Demerol 75 mg IV and Versed 5 mg IV  DESCRIPTION OF PROCEDURE:    Physical exam was performed.  Informed consent was obtained from the patient after explaining the benefits, risks, and alternatives to procedure.  The patient was connected to monitor and placed in left lateral position. Continuous oxygen was provided by nasal cannula and IV medicine administered through an indwelling cannula.  After administration of sedation and rectal exam, the patients rectum was intubated and the EC-3890Li ([id number])  colonoscope was advanced under direct visualization to the cecum.  The scope was removed slowly by carefully examining the color, texture, anatomy, and integrity mucosa on the way out.  The patient was recovered in endoscopy and discharged home in satisfactory condition.    COLON FINDINGS: There was moderate diverticulosis noted throughout the entire examined colon with associated muscular hypertrophy.  , The colon mucosa was otherwise normal.  , The colon was redundant. Manual abdominal counter-pressure was used to reach the cecum.  The patient was moved on to their back to reach the cecum, and Moderate sized internal hemorrhoids were found.  PREP QUALITY: good.   CECAL W/D TIME: 15 minutes     COMPLICATIONS: None  ENDOSCOPIC IMPRESSION: 1.   Moderate diverticulosis throughout the entire examined colon 2.   Moderate sized internal hemorrhoids 3.   The colon IS redundant  RECOMMENDATIONS: CONTINUE YOUR WEIGHT LOSS EFFORTS. DRINK WATER TO KEEP YOUR  URINE LIGHT YELLOW. Follow a HIGH FIBER DIET.  AVOID ITEMS THAT CAUSE BLOATING.  Next colonoscopy WITH AN OVERTUBE in 5-10 years IF THE BENEFITS OUTWEIGH THE RISKS.       _______________________________ Rosalie DoctorJonette Eva, MD 03/04/2013 2:58 PM

## 2013-03-07 ENCOUNTER — Encounter (HOSPITAL_COMMUNITY): Payer: Self-pay | Admitting: Gastroenterology

## 2013-03-13 ENCOUNTER — Ambulatory Visit (INDEPENDENT_AMBULATORY_CARE_PROVIDER_SITE_OTHER): Payer: Medicare Other | Admitting: Orthopedic Surgery

## 2013-03-13 ENCOUNTER — Ambulatory Visit (INDEPENDENT_AMBULATORY_CARE_PROVIDER_SITE_OTHER): Payer: Medicare Other

## 2013-03-13 ENCOUNTER — Encounter: Payer: Self-pay | Admitting: Orthopedic Surgery

## 2013-03-13 VITALS — BP 142/96 | Ht 60.0 in | Wt 235.0 lb

## 2013-03-13 DIAGNOSIS — M25569 Pain in unspecified knee: Secondary | ICD-10-CM

## 2013-03-13 NOTE — Patient Instructions (Signed)
Call if not better after 2 weeks    You have received a steroid shot. 15% of patients experience increased pain at the injection site with in the next 24 hours. This is best treated with ice and tylenol extra strength 2 tabs every 8 hours. If you are still having pain please call the office.

## 2013-03-13 NOTE — Progress Notes (Signed)
Patient ID: [name], female   DOB: 1940-05-07, 72 y.o.   MRN: [id number]  Chief Complaint  Patient presents with  . Follow-up    Recheck bilateral knee pain with leg pain.    BP 142/96  Ht 5' (1.524 m)  Wt 235 lb (106.595 kg)  BMI 45.90 kg/m2  HISTORY: 72 year-old female with bilateral aching knee pain diffusely about the knee unassociated with trauma associated with some swelling loss of motion giving out symptoms of the right lower extremity. She has a history of spinal implant stimulator secondary to chronic back pain. No surgery. She denies numbness or tingling in either extremity     ROS: Pain right hand pain lumbar spine. Denies numbness tingling. Does complain of unsteady gait.  Physical Exam:   BP 142/96  Ht 5' (1.524 m)  Wt 235 lb (106.595 kg)  BMI 45.90 kg/m2 She is oriented x3 her mood is pleasant her gait is normal with short steps short stride length and decreased knee flexion Is no deformity in either knee range of motion is 120 stability tests are normal motor exam is intact skin is intact McMurray signs are negative joint lines are mildly tender  Chest normal pulses and normal sensation in both lower extremities  Knee  Injection Procedure Note  Pre-operative Diagnosis: left knee oa  Post-operative Diagnosis: same  Indications: pain  Anesthesia: ethyl chloride   Procedure Details   Verbal consent was obtained for the procedure. Time out was completed.The joint was prepped with alcohol, followed by  Ethyl chloride spray and A 20 gauge needle was inserted into the knee via lateral approach; 4ml 1% lidocaine and 1 ml of depomedrol  was then injected into the joint . The needle was removed and the area cleansed and dressed.  Complications:  None; patient tolerated the procedure well. Knee  Injection Procedure Note  Pre-operative Diagnosis: right knee oa  Post-operative Diagnosis: same  Indications: pain  Anesthesia: ethyl chloride   Procedure  Details   Verbal consent was obtained for the procedure. Time out was completed.The joint was prepped with alcohol, followed by  Ethyl chloride spray and A 20 gauge needle was inserted into the knee via lateral approach; 4ml 1% lidocaine and 1 ml of depomedrol  was then injected into the joint . The needle was removed and the area cleansed and dressed.  Complications:  None; patient tolerated the procedure well.

## 2013-06-02 ENCOUNTER — Other Ambulatory Visit (HOSPITAL_COMMUNITY): Payer: Self-pay

## 2013-06-02 DIAGNOSIS — R0602 Shortness of breath: Secondary | ICD-10-CM

## 2013-06-02 LAB — PULMONARY FUNCTION TEST
DL/VA % pred: 86 %
DL/VA: 3.66 ml/min/mmHg/L
DLCO cor % pred: 55 %
DLCO cor: 10.52 ml/min/mmHg
DLCO unc % pred: 55 %
DLCO unc: 10.52 ml/min/mmHg
FEF 25-75 Post: 2.15 L/sec
FEF 25-75 Pre: 2.51 L/sec
FEF2575-%CHANGE-POST: -14 %
FEF2575-%Pred-Post: 173 %
FEF2575-%Pred-Pre: 202 %
FEV1-%CHANGE-POST: -7 %
FEV1-%PRED-PRE: 126 %
FEV1-%Pred-Post: 117 %
FEV1-Post: 1.6 L
FEV1-Pre: 1.73 L
FEV1FVC-%Change-Post: -3 %
FEV1FVC-%Pred-Pre: 118 %
FEV6-%Change-Post: -4 %
FEV6-%PRED-POST: 108 %
FEV6-%Pred-Pre: 113 %
FEV6-PRE: 1.92 L
FEV6-Post: 1.84 L
FEV6FVC-%PRED-PRE: 105 %
FEV6FVC-%Pred-Post: 105 %
FVC-%Change-Post: -4 %
FVC-%PRED-PRE: 107 %
FVC-%Pred-Post: 103 %
FVC-Post: 1.84 L
FVC-Pre: 1.92 L
POST FEV1/FVC RATIO: 87 %
PRE FEV6/FVC RATIO: 100 %
Post FEV6/FVC ratio: 100 %
Pre FEV1/FVC ratio: 90 %
RV % pred: 75 %
RV: 1.57 L
TLC % PRED: 77 %
TLC: 3.47 L

## 2013-06-06 ENCOUNTER — Ambulatory Visit (HOSPITAL_COMMUNITY)
Admission: RE | Admit: 2013-06-06 | Discharge: 2013-06-06 | Disposition: A | Discharge status: Home / Self Care | Payer: Medicare HMO | Source: Ambulatory Visit | Attending: Pulmonary Disease | Admitting: Pulmonary Disease

## 2013-06-06 DIAGNOSIS — N189 Chronic kidney disease, unspecified: Secondary | ICD-10-CM | POA: Insufficient documentation

## 2013-06-06 DIAGNOSIS — I129 Hypertensive chronic kidney disease with stage 1 through stage 4 chronic kidney disease, or unspecified chronic kidney disease: Secondary | ICD-10-CM | POA: Insufficient documentation

## 2013-06-06 DIAGNOSIS — Z87891 Personal history of nicotine dependence: Secondary | ICD-10-CM | POA: Insufficient documentation

## 2013-06-06 DIAGNOSIS — R0989 Other specified symptoms and signs involving the circulatory and respiratory systems: Principal | ICD-10-CM | POA: Insufficient documentation

## 2013-06-06 DIAGNOSIS — Z6841 Body Mass Index (BMI) 40.0 and over, adult: Secondary | ICD-10-CM | POA: Insufficient documentation

## 2013-06-06 DIAGNOSIS — I519 Heart disease, unspecified: Secondary | ICD-10-CM

## 2013-06-06 DIAGNOSIS — E119 Type 2 diabetes mellitus without complications: Secondary | ICD-10-CM | POA: Insufficient documentation

## 2013-06-06 DIAGNOSIS — R0609 Other forms of dyspnea: Secondary | ICD-10-CM | POA: Insufficient documentation

## 2013-06-09 NOTE — Progress Notes (Signed)
*  PRELIMINARY RESULTS* Echocardiogram 2D Echocardiogram has been performed.  [name] 06/09/2013, 11:18 AM

## 2013-06-10 ENCOUNTER — Ambulatory Visit (HOSPITAL_COMMUNITY): Admission: RE | Admit: 2013-06-10 | Payer: Medicare HMO | Source: Ambulatory Visit

## 2013-06-25 ENCOUNTER — Ambulatory Visit (HOSPITAL_COMMUNITY)
Admission: RE | Admit: 2013-06-25 | Discharge: 2013-06-25 | Disposition: A | Discharge status: Home / Self Care | Payer: Medicare HMO | Source: Ambulatory Visit | Attending: Pulmonary Disease | Admitting: Pulmonary Disease

## 2013-06-25 DIAGNOSIS — R0602 Shortness of breath: Secondary | ICD-10-CM | POA: Insufficient documentation

## 2013-06-25 MED ORDER — ALBUTEROL SULFATE (2.5 MG/3ML) 0.083% IN NEBU
2.5000 mg | INHALATION_SOLUTION | Freq: Once | RESPIRATORY_TRACT | Status: AC
  Administered 2013-06-25: 2.5 mg via RESPIRATORY_TRACT

## 2013-07-23 ENCOUNTER — Encounter: Payer: Self-pay | Admitting: Cardiology

## 2013-07-23 ENCOUNTER — Encounter: Payer: Self-pay | Admitting: *Deleted

## 2013-07-23 ENCOUNTER — Ambulatory Visit (INDEPENDENT_AMBULATORY_CARE_PROVIDER_SITE_OTHER): Payer: Commercial Managed Care - HMO | Admitting: Cardiology

## 2013-07-23 VITALS — BP 152/67 | HR 89 | Ht 60.0 in | Wt 235.0 lb

## 2013-07-23 DIAGNOSIS — I10 Essential (primary) hypertension: Secondary | ICD-10-CM

## 2013-07-23 DIAGNOSIS — N183 Chronic kidney disease, stage 3 unspecified: Secondary | ICD-10-CM

## 2013-07-23 DIAGNOSIS — R0602 Shortness of breath: Secondary | ICD-10-CM

## 2013-07-23 NOTE — Assessment & Plan Note (Signed)
Has followed with Dr. Sudie BaileyKnowlton and also Dr. Juanetta GoslingHawkins. I do not have recent lab work for followup creatinine.

## 2013-07-23 NOTE — Patient Instructions (Signed)
.  Your physician recommends that you schedule a follow-up appointment in: To be determined. We will call you with results.  Your physician has requested that you have a lexiscan myoview. For further information please visit [email or web address]. Please follow instruction sheet, as given.

## 2013-07-23 NOTE — Assessment & Plan Note (Signed)
Blood pressure elevated today. Patient reports compliance with her medications.

## 2013-07-23 NOTE — Addendum Note (Signed)
Addended by: Thompson GrayerURHAM, Cyprian Gongaware C on: 07/23/2013 11:46 AM   Modules accepted: Orders

## 2013-07-23 NOTE — Assessment & Plan Note (Signed)
Progressive over the last 6 months, no definite chest pain. Recent workup with Dr. Juanetta GoslingHawkins noted. Echocardiogram shows preserved LVEF, however wall motion abnormality does not exclude ischemic heart disease and she had mild diastolic dysfunction. Thickened RV noted however PASP was within normal range. She has not had ischemic testing since her cardiac catheterization greater than a decade ago. Lexiscan Cardiolite will be arranged. We will call her with the results.

## 2013-07-23 NOTE — Progress Notes (Signed)
Clinical Summary [name] is a 73 y.o.female referred to the office by Dr. Juanetta GoslingHawkins. I saw her in April 2012 for a preoperative consultation prior to right rotator cuff surgery, she has not had followup since that time. She tells me that over the last 6 months she has been more short of breath with activities, described NYHA class III symptoms at times. States that around the time symptoms started, she was having to care for her husband who was ill, noticed being overly short of breath when she was helping him with a bath and dressing. Since then she has symptoms walking through her house. No chest pain. Does describe wheezing at nighttime. She had pulmonary workup with Dr. Juanetta GoslingHawkins, PFTs were not overly worrisome.  She had a recent echocardiogram done in March, read by Dr. Tenny Crawoss to show mild LVH with LVEF 60%, basal posterior akinesis, grade 1 diastolic dysfunction, trivial mitral regurgitation, mildly thickened RV with normal contraction, PASP 23 mm mercury, no pericardial effusion.  Record review finds documentation of normal coronary arteries at catheterization 2003.   No Known Allergies  Current Outpatient Prescriptions  Medication Sig Dispense Refill  . ASTEPRO 0.15 % SOLN Place 1 spray into the nose as needed.       . doxepin (SINEQUAN) 25 MG capsule Take 100 mg by mouth at bedtime.       . furosemide (LASIX) 20 MG tablet Take 20 mg by mouth daily as needed for fluid. Take one tablet by mouth as needed      . gabapentin (NEURONTIN) 100 MG capsule Take 100 mg by mouth 2 (two) times daily as needed (pain).      Marland Kitchen. glimepiride (AMARYL) 4 MG tablet Take 4 mg by mouth daily.       Marland Kitchen. levothyroxine (SYNTHROID, LEVOTHROID) 25 MCG tablet Take 25 mcg by mouth daily before breakfast.       . losartan (COZAAR) 25 MG tablet Take 25 mg by mouth daily.       . meclizine (ANTIVERT) 25 MG tablet Take 25 mg by mouth 3 (three) times daily as needed (vertigo).       . metFORMIN (GLUCOPHAGE) 500 MG  tablet Take 500 mg by mouth 2 (two) times daily with a meal.        . naproxen sodium (ALEVE) 220 MG tablet Take 220 mg by mouth as needed (pain).       . NASONEX 50 MCG/ACT nasal spray Place 2 sprays into the nose daily as needed (allergies).       Marland Kitchen. omeprazole (PRILOSEC) 20 MG capsule Take 20 mg by mouth 2 (two) times daily.       . phenazopyridine (PYRIDIUM) 200 MG tablet Take 200 mg by mouth 3 (three) times daily as needed for pain.      . promethazine (PHENERGAN) 25 MG tablet Take 25 mg by mouth 2 (two) times daily as needed for nausea.        No current facility-administered medications for this visit.    Past Medical History  Diagnosis Date  . Asthma   . Depression   . Type 2 diabetes mellitus with diabetic neuropathy   . GERD (gastroesophageal reflux disease)   . Essential hypertension, benign   . Low back pain   . IBS (irritable bowel syndrome)   . History of recurrent UTIs   . Cataract   . Glaucoma   . Arthritis   . Hx of migraines   . Carpal tunnel syndrome   .  Morbid obesity   . Sleep apnea     Intolerant to CPAP   . Hx of colonic polyps   . Anxiety   . Carotid artery aneurysm     Right s/p endovascular treatment 2004  . Neurogenic bladder   . Stroke   . Osteoporosis   . Hypothyroidism   . CKD (chronic kidney disease) stage 3, GFR 30-59 ml/min     Past Surgical History  Procedure Laterality Date  . Vesicovaginal fistula closure w/ tah    . Carpal tunnel release - bilateral  1992  . Total hip replacement - right  2002  . Stomach surgery gastropexy for gastric volvulus  2009  . Cerebral aneurysm repair  2004    Coil   . Tubular adenoma    . Shoulder surgery      Rotator cuff repair, 2012  . Spinal cord stimulator insertion    . Colonoscopy  06/14/2006    SLF:two 3 mm cecal polyps/pancolonic diverticulosis/sigmoid colon lipoma, tubular adenoma.   . Esophagogastroduodenoscopy  08/12/2007    ZOX:WRUEAVSLF:Normal esophagus without evidence of Barrett's,mass, erosion  ulceration or stricture.  The GE junction was at 40 cm.  The Bravo  capsule was placed at 34 cm/ Unable to appreciate a hiatal hernia in the retroflexed viewNormal duodenal bulb and second portion of the duodenum. mild gastritis.   . Colonoscopy N/A 03/04/2013    Procedure: COLONOSCOPY;  Surgeon: West BaliSandi L Fields, MD;  Location: [facility];  Service: Endoscopy;  Laterality: N/A;  11:30-moved to 12:35 Soledad GerlachLeigh Ann to notify pt    Family History  Problem Relation Age of Onset  . Heart disease Mother   . Cancer Mother   . Diabetes Brother   . Cancer Brother     Prostate   . Cancer Brother     Pancreatic   . Cancer Sister     Breast   . Heart attack Sister   . Lung disease    . Asthma    . Colon cancer Neg Hx     Social History [name] reports that she has quit smoking. Her smoking use included Cigarettes. She smoked 0.00 packs per day. She has never used smokeless tobacco. [name] reports that she does not drink alcohol.  Review of Systems No palpitations or syncope. No cough, fevers or chills. No definite orthopnea. Otherwise as outlined.  Physical Examination Filed Vitals:   07/23/13 1031  BP: 152/67  Pulse: 89   Filed Weights   07/23/13 1031  Weight: 235 lb (106.595 kg)   Short statured, overweight woman, no distress. HEENT: Conjunctiva and lids normal, oropharynx clear. Neck: Supple, no elevated JVP or carotid bruits, no thyromegaly. Lungs: Clear to auscultation with decreased breath sounds, nonlabored breathing at rest. Cardiac: Regular rate and rhythm, no S3 or significant systolic murmur, no pericardial rub. Abdomen: Soft, nontender, bowel sounds present, no guarding or rebound. Extremities: Trace edema, distal pulses 2+. Skin: Warm and dry. Musculoskeletal: No kyphosis. Neuropsychiatric: Alert and oriented x3, affect grossly appropriate.   Problem List and Plan   Shortness of breath Progressive over the last 6 months, no definite chest pain. Recent  workup with Dr. Juanetta GoslingHawkins noted. Echocardiogram shows preserved LVEF, however wall motion abnormality does not exclude ischemic heart disease and she had mild diastolic dysfunction. Thickened RV noted however PASP was within normal range. She has not had ischemic testing since her cardiac catheterization greater than a decade ago. Lexiscan Cardiolite will be arranged. We will call her with  the results.  CKD (chronic kidney disease) stage 3, GFR 30-59 ml/min Has followed with Dr. [name] and also Dr. Juanetta Gosling. I do not have recent lab work for followup creatinine.  Essential hypertension, benign Blood pressure elevated today. Patient reports compliance with her medications.    Jonelle Sidle, M.D., F.A.C.C.

## 2013-08-01 ENCOUNTER — Encounter (HOSPITAL_COMMUNITY)
Admission: RE | Admit: 2013-08-01 | Discharge: 2013-08-01 | Disposition: A | Discharge status: Home / Self Care | Payer: Medicare HMO | Source: Ambulatory Visit | Attending: Cardiology | Admitting: Cardiology

## 2013-08-01 ENCOUNTER — Encounter (HOSPITAL_COMMUNITY): Payer: Self-pay

## 2013-08-01 DIAGNOSIS — R9439 Abnormal result of other cardiovascular function study: Secondary | ICD-10-CM | POA: Insufficient documentation

## 2013-08-01 DIAGNOSIS — R0602 Shortness of breath: Secondary | ICD-10-CM

## 2013-08-01 DIAGNOSIS — R0989 Other specified symptoms and signs involving the circulatory and respiratory systems: Principal | ICD-10-CM | POA: Insufficient documentation

## 2013-08-01 DIAGNOSIS — R0609 Other forms of dyspnea: Secondary | ICD-10-CM

## 2013-08-01 MED ORDER — TECHNETIUM TC 99M SESTAMIBI GENERIC - CARDIOLITE
10.0000 | Freq: Once | INTRAVENOUS | Status: AC | PRN
  Administered 2013-08-01: 10 via INTRAVENOUS

## 2013-08-01 MED ORDER — REGADENOSON 0.4 MG/5ML IV SOLN
INTRAVENOUS | Status: AC
  Administered 2013-08-01: 0.4 mg via INTRAVENOUS
  Filled 2013-08-01: qty 5

## 2013-08-01 MED ORDER — SODIUM CHLORIDE 0.9 % IJ SOLN
INTRAMUSCULAR | Status: AC
  Administered 2013-08-01: 10 mL via INTRAVENOUS
  Filled 2013-08-01: qty 10

## 2013-08-01 MED ORDER — TECHNETIUM TC 99M SESTAMIBI - CARDIOLITE
30.0000 | Freq: Once | INTRAVENOUS | Status: AC | PRN
  Administered 2013-08-01: 12:00:00 30 via INTRAVENOUS

## 2013-08-01 NOTE — Progress Notes (Signed)
Stress Lab Nurses Notes - [facility]  Jeb Leveringnez H Rye 08/01/2013 Reason for doing test: Dyspnea Type of test: Marlane HatcherLexiscan Cardiolite Nurse performing test: Parke PoissonPhyllis Billingsly, RN Nuclear Medicine Tech: Marcella DubsMiranda Womack Echo Tech: Not Applicable MD performing test: Koneswaran/K.Lyman BishopLawrence NP Family MD: Nira RetortHawkins & [place] Test explained and consent signed: yes IV started: 22g jelco, Saline lock flushed, No redness or edema and Saline lock started in radiology Symptoms: SOB & Stomach discomfort Treatment/Intervention: None Reason test stopped: protocol completed After recovery IV was: Discontinued via X-ray tech and No redness or edema Patient to return to Nuc. Med at : 12:30 Patient discharged: Home Patient's Condition upon discharge was: stable Comments: During test BP 131/80 & HR 98.  Recovery BP 119/73 & HR 75.  Symptoms resolved in recovery. Tawni MillersPhyllis T Jayjay Littles

## 2013-08-12 ENCOUNTER — Ambulatory Visit (INDEPENDENT_AMBULATORY_CARE_PROVIDER_SITE_OTHER): Payer: Commercial Managed Care - HMO | Admitting: Orthopedic Surgery

## 2013-08-12 VITALS — BP 146/74 | Ht 60.0 in | Wt 235.0 lb

## 2013-08-12 DIAGNOSIS — M170 Bilateral primary osteoarthritis of knee: Secondary | ICD-10-CM

## 2013-08-12 DIAGNOSIS — M1710 Unilateral primary osteoarthritis, unspecified knee: Secondary | ICD-10-CM

## 2013-08-12 DIAGNOSIS — IMO0002 Reserved for concepts with insufficient information to code with codable children: Secondary | ICD-10-CM

## 2013-08-12 MED ORDER — ACETAMINOPHEN-CODEINE 300-30 MG PO TABS: 1.0000 | ORAL_TABLET | Freq: Four times a day (QID) | ORAL | Status: DC | PRN

## 2013-08-12 MED ORDER — MELOXICAM 7.5 MG PO TABS: 7.5000 mg | ORAL_TABLET | Freq: Every day | ORAL | Status: DC

## 2013-08-12 NOTE — Patient Instructions (Signed)
Wear and Tear Disorders of the Knee (Arthritis, Osteoarthritis) Everyone will experience wear and tear injuries (arthritis, osteoarthritis) of the knee. These are the changes we all get as we age. They come from the joint stress of daily living. The amount of cartilage damage in your knee and your symptoms determine if you need surgery.    WHAT IS ARTHRITIS OF THE KNEE?  Arthritis of the knee is most often osteoarthritis. In this disease, the cartilage in the joint gradually wears away. It may be caused by excess stress on the joint from:  Trauma.  Deformity.  Repeated injury.  Excess weight.  It most often affects middle-aged and older people. A young person who develops osteoarthritis may have an inherited form of the disease or may have experienced continuous irritation from an unrepaired knee injury or other injury.  In rheumatoid arthritis, which can also affect the knees, the joint becomes inflamed and cartilage may be destroyed. Rheumatoid arthritis often affects people at an earlier age than osteoarthritis and often involves multiple joints.  Arthritis can also affect supporting structures such as muscles, tendons, and ligaments. WHAT ARE SIGNS OF ARTHRITIS OF THE KNEE?  Someone who has arthritis of the knee may experience:  Pain.  Swelling/ fluid on the knee.  A decrease in knee motion.  A common symptom is morning stiffness. This generally improves as the person moves around.  Sometimes the joint locks or clicks. These signs may occur in other knee disorders as well.  The caregiver may confirm the diagnosis by:  Performing a physical examination.  Examining x-rays, which typically show a loss of joint space.  Blood tests may be helpful for diagnosing rheumatoid arthritis, but other tests may be needed.  Analyzing fluid from the knee joint may be helpful in diagnosing some kinds of arthritis.  The caregiver may use arthroscopy to directly see damage to cartilage, tendons, and  ligaments and to confirm a diagnosis. Arthroscopy is usually done only if a repair procedure is to be performed. WHAT IS TREATMENT FOR ARTHRITIS OF THE KNEE?  Most often osteoarthritis of the knee is treated with pain-reducing medicines, such as:  Nonsteroidal anti-inflammatory drugs (NSAID's)  Exercises to restore joint movement and strengthen the knee.  Losing excess weight can also help people with osteoarthritis.  Rheumatoid arthritis of the knee may require physical therapy and more powerful medicines. In people with severe arthritis of the knee, a seriously damaged joint may need to be replaced with an artificial one.

## 2013-08-12 NOTE — Progress Notes (Signed)
Patient ID: [name] H Dileo, female   DOB: 08/07/1940, 73 y.o.   MRN: [id number] Chief Complaint  Patient presents with  . Knee Pain    Request recheck bilateral knees and injections   BP 146/74  Ht 5' (1.524 m)  Wt 235 lb (106.595 kg)  BMI 45.90 kg/m2  Encounter Diagnosis  Name Primary?  . Osteoarthritis of both knees Yes    Knee  Injection Procedure Note  Pre-operative Diagnosis: left knee oa  Post-operative Diagnosis: same  Indications: pain  Anesthesia: ethyl chloride   Procedure Details   Verbal consent was obtained for the procedure. Time out was completed.The joint was prepped with alcohol, followed by  Ethyl chloride spray and A 20 gauge needle was inserted into the knee via lateral approach; 4ml 1% lidocaine and 1 ml of depomedrol  was then injected into the joint . The needle was removed and the area cleansed and dressed.  Complications:  None; patient tolerated the procedure well. Knee  Injection Procedure Note  Pre-operative Diagnosis: right knee oa  Post-operative Diagnosis: same  Indications: pain  Anesthesia: ethyl chloride   Procedure Details   Verbal consent was obtained for the procedure. Time out was completed.The joint was prepped with alcohol, followed by  Ethyl chloride spray and A 20 gauge needle was inserted into the knee via lateral approach; 4ml 1% lidocaine and 1 ml of depomedrol  was then injected into the joint . The needle was removed and the area cleansed and dressed.  Complications:  None; patient tolerated the procedure well.  Meds ordered this encounter  Medications  . meloxicam (MOBIC) 7.5 MG tablet    Sig: Take 1 tablet (7.5 mg total) by mouth daily.    Dispense:  60 tablet    Refill:  5  . Acetaminophen-Codeine 300-30 MG per tablet    Sig: Take 1 tablet by mouth every 6 (six) hours as needed for pain.    Dispense:  60 tablet    Refill:  5

## 2013-11-07 ENCOUNTER — Encounter (HOSPITAL_COMMUNITY): Payer: Self-pay | Admitting: Emergency Medicine

## 2013-11-07 ENCOUNTER — Emergency Department (HOSPITAL_COMMUNITY)
Admission: EM | Admit: 2013-11-07 | Discharge: 2013-11-07 | Disposition: A | Discharge status: Home / Self Care | Payer: Medicare HMO | Attending: Emergency Medicine | Admitting: Emergency Medicine

## 2013-11-07 ENCOUNTER — Emergency Department (HOSPITAL_COMMUNITY): Payer: Medicare HMO

## 2013-11-07 DIAGNOSIS — E1142 Type 2 diabetes mellitus with diabetic polyneuropathy: Secondary | ICD-10-CM | POA: Diagnosis not present

## 2013-11-07 DIAGNOSIS — R110 Nausea: Secondary | ICD-10-CM | POA: Insufficient documentation

## 2013-11-07 DIAGNOSIS — E039 Hypothyroidism, unspecified: Secondary | ICD-10-CM | POA: Insufficient documentation

## 2013-11-07 DIAGNOSIS — N183 Chronic kidney disease, stage 3 unspecified: Secondary | ICD-10-CM | POA: Insufficient documentation

## 2013-11-07 DIAGNOSIS — J45909 Unspecified asthma, uncomplicated: Secondary | ICD-10-CM | POA: Insufficient documentation

## 2013-11-07 DIAGNOSIS — J159 Unspecified bacterial pneumonia: Secondary | ICD-10-CM | POA: Diagnosis not present

## 2013-11-07 DIAGNOSIS — J189 Pneumonia, unspecified organism: Secondary | ICD-10-CM

## 2013-11-07 DIAGNOSIS — E1149 Type 2 diabetes mellitus with other diabetic neurological complication: Secondary | ICD-10-CM | POA: Insufficient documentation

## 2013-11-07 DIAGNOSIS — F411 Generalized anxiety disorder: Secondary | ICD-10-CM | POA: Diagnosis not present

## 2013-11-07 DIAGNOSIS — J029 Acute pharyngitis, unspecified: Secondary | ICD-10-CM | POA: Insufficient documentation

## 2013-11-07 DIAGNOSIS — Z87440 Personal history of urinary (tract) infections: Secondary | ICD-10-CM | POA: Diagnosis not present

## 2013-11-07 DIAGNOSIS — J040 Acute laryngitis: Secondary | ICD-10-CM | POA: Insufficient documentation

## 2013-11-07 DIAGNOSIS — F3289 Other specified depressive episodes: Secondary | ICD-10-CM | POA: Diagnosis not present

## 2013-11-07 DIAGNOSIS — Z860100 Personal history of colon polyps, unspecified: Secondary | ICD-10-CM | POA: Insufficient documentation

## 2013-11-07 DIAGNOSIS — Z8673 Personal history of transient ischemic attack (TIA), and cerebral infarction without residual deficits: Secondary | ICD-10-CM | POA: Diagnosis not present

## 2013-11-07 DIAGNOSIS — R197 Diarrhea, unspecified: Secondary | ICD-10-CM | POA: Insufficient documentation

## 2013-11-07 DIAGNOSIS — Z791 Long term (current) use of non-steroidal anti-inflammatories (NSAID): Secondary | ICD-10-CM | POA: Insufficient documentation

## 2013-11-07 DIAGNOSIS — Z792 Long term (current) use of antibiotics: Secondary | ICD-10-CM | POA: Diagnosis not present

## 2013-11-07 DIAGNOSIS — K219 Gastro-esophageal reflux disease without esophagitis: Secondary | ICD-10-CM | POA: Diagnosis not present

## 2013-11-07 DIAGNOSIS — I129 Hypertensive chronic kidney disease with stage 1 through stage 4 chronic kidney disease, or unspecified chronic kidney disease: Secondary | ICD-10-CM | POA: Diagnosis not present

## 2013-11-07 DIAGNOSIS — F329 Major depressive disorder, single episode, unspecified: Secondary | ICD-10-CM | POA: Diagnosis not present

## 2013-11-07 DIAGNOSIS — Z79899 Other long term (current) drug therapy: Secondary | ICD-10-CM | POA: Insufficient documentation

## 2013-11-07 DIAGNOSIS — M129 Arthropathy, unspecified: Secondary | ICD-10-CM | POA: Diagnosis not present

## 2013-11-07 MED ORDER — AZITHROMYCIN 250 MG PO TABS: ORAL_TABLET | ORAL | Status: DC

## 2013-11-07 MED ORDER — AZITHROMYCIN 250 MG PO TABS
500.0000 mg | ORAL_TABLET | Freq: Once | ORAL | Status: AC
  Administered 2013-11-07: 500 mg via ORAL
  Filled 2013-11-07: qty 2

## 2013-11-07 NOTE — Discharge Instructions (Signed)
Pneumonia °Pneumonia is an infection of the lungs.  °CAUSES °Pneumonia may be caused by bacteria or a virus. Usually, these infections are caused by breathing infectious particles into the lungs (respiratory tract). °SIGNS AND SYMPTOMS  °· Cough. °· Fever. °· Chest pain. °· Increased rate of breathing. °· Wheezing. °· Mucus production. °DIAGNOSIS  °If you have the common symptoms of pneumonia, your health care provider will typically confirm the diagnosis with a chest X-ray. The X-ray will show an abnormality in the lung (pulmonary infiltrate) if you have pneumonia. Other tests of your blood, urine, or sputum may be done to find the specific cause of your pneumonia. Your health care provider may also do tests (blood gases or pulse oximetry) to see how well your lungs are working. °TREATMENT  °Some forms of pneumonia may be spread to other people when you cough or sneeze. You may be asked to wear a mask before and during your exam. Pneumonia that is caused by bacteria is treated with antibiotic medicine. Pneumonia that is caused by the influenza virus may be treated with an antiviral medicine. Most other viral infections must run their course. These infections will not respond to antibiotics.  °HOME CARE INSTRUCTIONS  °· Cough suppressants may be used if you are losing too much rest. However, coughing protects you by clearing your lungs. You should avoid using cough suppressants if you can. °· Your health care provider may have prescribed medicine if he or she thinks your pneumonia is caused by bacteria or influenza. Finish your medicine even if you start to feel better. °· Your health care provider may also prescribe an expectorant. This loosens the mucus to be coughed up. °· Take medicines only as directed by your health care provider. °· Do not smoke. Smoking is a common cause of bronchitis and can contribute to pneumonia. If you are a smoker and continue to smoke, your cough may last several weeks after your  pneumonia has cleared. °· A cold steam vaporizer or humidifier in your room or home may help loosen mucus. °· Coughing is often worse at night. Sleeping in a semi-upright position in a recliner or using a couple pillows under your head will help with this. °· Get rest as you feel it is needed. Your body will usually let you know when you need to rest. °PREVENTION °A pneumococcal shot (vaccine) is available to prevent a common bacterial cause of pneumonia. This is usually suggested for: °· People over 65 years old. °· Patients on chemotherapy. °· People with chronic lung problems, such as bronchitis or emphysema. °· People with immune system problems. °If you are over 65 or have a high risk condition, you may receive the pneumococcal vaccine if you have not received it before. In some countries, a routine influenza vaccine is also recommended. This vaccine can help prevent some cases of pneumonia. You may be offered the influenza vaccine as part of your care. °If you smoke, it is time to quit. You may receive instructions on how to stop smoking. Your health care provider can provide medicines and counseling to help you quit. °SEEK MEDICAL CARE IF: °You have a fever. °SEEK IMMEDIATE MEDICAL CARE IF:  °· Your illness becomes worse. This is especially true if you are elderly or weakened from any other disease. °· You cannot control your cough with suppressants and are losing sleep. °· You begin coughing up blood. °· You develop pain which is getting worse or is uncontrolled with medicines. °· Any of the symptoms   which initially brought you in for treatment are getting worse rather than better.  You develop shortness of breath or chest pain. MAKE SURE YOU:   Understand these instructions.  Will watch your condition.  Will get help right away if you are not doing well or get worse. Document Released: 03/20/2005 Document Revised: 08/04/2013 Document Reviewed: 06/09/2010 [facility] Patient Information 2015  [place]. This information is not intended to replace advice given to you by your health care provider. Make sure you discuss any questions you have with your health care provider.  Laryngitis At the top of your windpipe is your voice box. It is the source of your voice. Inside your voice box are 2 bands of muscles called vocal cords. When you breathe, your vocal cords are relaxed and open so that air can get into the lungs. When you decide to say something, these cords come together and vibrate. The sound from these vibrations goes into your throat and comes out through your mouth as sound. Laryngitis is an inflammation of the vocal cords that causes hoarseness, cough, loss of voice, sore throat, and dry throat. Laryngitis can be temporary (acute) or long-term (chronic). Most cases of acute laryngitis improve with time.Chronic laryngitis lasts for more than 3 weeks. CAUSES Laryngitis can often be related to excessive smoking, talking, or yelling, as well as inhalation of toxic fumes and allergies. Acute laryngitis is usually caused by a viral infection, vocal strain, measles or mumps, or bacterial infections. Chronic laryngitis is usually caused by vocal cord strain, vocal cord injury, postnasal drip, growths on the vocal cords, or acid reflux. SYMPTOMS   Cough.  Sore throat.  Dry throat. RISK FACTORS  Respiratory infections.  Exposure to irritating substances, such as cigarette smoke, excessive amounts of alcohol, stomach acids, and workplace chemicals.  Voice trauma, such as vocal cord injury from shouting or speaking too loud. DIAGNOSIS  Your cargiver will perform a physical exam. During the physical exam, your caregiver will examine your throat. The most common sign of laryngitis is hoarseness. Laryngoscopy may be necessary to confirm the diagnosis of this condition. This procedure allows your caregiver to look into the larynx. HOME CARE INSTRUCTIONS  Drink enough fluids to keep  your urine clear or pale yellow.  Rest until you no longer have symptoms or as directed by your caregiver.  Breathe in moist air.  Take all medicine as directed by your caregiver.  Do not smoke.  Talk as little as possible (this includes whispering).  Write on paper instead of talking until your voice is back to normal.  Follow up with your caregiver if your condition has not improved after 10 days. SEEK MEDICAL CARE IF:   You have trouble breathing.  You cough up blood.  You have persistent fever.  You have increasing pain.  You have difficulty swallowing. MAKE SURE YOU:  Understand these instructions.  Will watch your condition.  Will get help right away if you are not doing well or get worse. Document Released: 03/20/2005 Document Revised: 06/12/2011 Document Reviewed: 05/26/2010 [facility] Patient Information 2015 [place], [place]. This information is not intended to replace advice given to you by your health care provider. Make sure you discuss any questions you have with your health care provider.

## 2013-11-07 NOTE — ED Provider Notes (Signed)
CSN: [id number]     Arrival date & time 11/07/13  1943 History  This chart was scribed for Flint Melter, MD by Chestine Spore, ED Scribe. The patient was seen in room [facility]08/[facility]08 at 8:39 PM.    Chief Complaint  Patient presents with  . Sore Throat     The history is provided by the patient. No language interpreter was used.  [name] is a 73 y.o. female with a medical hx of DM, GERD, who was brought in by parents to the ED complaining of a sore throat onset 2-3 weeks. She states that she has not been able to talk well today. She states that every once in awhile she will cough up some thick mucus. She states that she is having associated symptoms of cough, diarrhea, nausea. She states that when she uses a cough drops they help to soothe her ailments. She denies vomiting, rhinorrhea, sneezing, and any other associated symptoms.  She denies smoking, she states that she is a former smoker. She states that she is having angioplasty done sometimes soon.    Past Medical History  Diagnosis Date  . Asthma   . Depression   . Type 2 diabetes mellitus with diabetic neuropathy   . GERD (gastroesophageal reflux disease)   . Essential hypertension, benign   . Low back pain   . IBS (irritable bowel syndrome)   . History of recurrent UTIs   . Cataract   . Glaucoma   . Arthritis   . Hx of migraines   . Carpal tunnel syndrome   . Morbid obesity   . Sleep apnea     Intolerant to CPAP   . Hx of colonic polyps   . Anxiety   . Carotid artery aneurysm     Right s/p endovascular treatment 2004  . Neurogenic bladder   . Stroke   . Osteoporosis   . Hypothyroidism   . CKD (chronic kidney disease) stage 3, GFR 30-59 ml/min    Past Surgical History  Procedure Laterality Date  . Vesicovaginal fistula closure w/ tah    . Carpal tunnel release - bilateral  1992  . Total hip replacement - right  2002  . Stomach surgery gastropexy for gastric volvulus  2009  . Tubular adenoma    . Shoulder  surgery      Rotator cuff repair, 2012  . Spinal cord stimulator insertion    . Colonoscopy  06/14/2006    SLF:two 3 mm cecal polyps/pancolonic diverticulosis/sigmoid colon lipoma, tubular adenoma.   . Esophagogastroduodenoscopy  08/12/2007    WUJ:WJXBJY esophagus without evidence of Barrett's,mass, erosion ulceration or stricture.  The GE junction was at 40 cm.  The Bravo  capsule was placed at 34 cm/ Unable to appreciate a hiatal hernia in the retroflexed viewNormal duodenal bulb and second portion of the duodenum. mild gastritis.   . Colonoscopy N/A 03/04/2013    Procedure: COLONOSCOPY;  Surgeon: West Bali, MD;  Location: [facility];  Service: Endoscopy;  Laterality: N/A;  11:30-moved to 12:35 Soledad Gerlach to notify pt  . Cerebral aneurysm repair  2004    Coil   . Abdominal hysterectomy     Family History  Problem Relation Age of Onset  . Heart disease Mother   . Cancer Mother   . Diabetes Brother   . Cancer Brother     Prostate   . Cancer Brother     Pancreatic   . Cancer Sister     Breast   .  Heart attack Sister   . Lung disease    . Asthma    . Colon cancer Neg Hx    History  Substance Use Topics  . Smoking status: Former Smoker    Types: Cigarettes  . Smokeless tobacco: Never Used  . Alcohol Use: No   OB History   Grav Para Term Preterm Abortions TAB SAB Ect Mult Living                 Review of Systems  HENT: Positive for sore throat. Negative for rhinorrhea and sneezing.   Respiratory: Positive for cough.   Gastrointestinal: Positive for nausea and diarrhea. Negative for vomiting.  All other systems reviewed and are negative.     Allergies  Review of patient's allergies indicates no known allergies.  Home Medications   Prior to Admission medications   Medication Sig Start Date End Date Taking? Authorizing Provider  alendronate (FOSAMAX) 70 MG tablet Take 70 mg by mouth every Wednesday. Take with a full glass of water on an empty stomach.   Yes  Historical Provider, MD  doxepin (SINEQUAN) 25 MG capsule Take 100 mg by mouth at bedtime.  06/30/10  Yes Historical Provider, MD  furosemide (LASIX) 20 MG tablet Take 20 mg by mouth daily as needed for fluid.    Yes Historical Provider, MD  gabapentin (NEURONTIN) 100 MG capsule Take 100 mg by mouth 2 (two) times daily as needed (pain).   Yes Historical Provider, MD  glimepiride (AMARYL) 4 MG tablet Take 4 mg by mouth daily.  06/30/10  Yes Historical Provider, MD  levothyroxine (SYNTHROID, LEVOTHROID) 50 MCG tablet Take 50 mcg by mouth daily before breakfast.   Yes Historical Provider, MD  losartan (COZAAR) 25 MG tablet Take 25 mg by mouth daily.  04/29/13  Yes Historical Provider, MD  meclizine (ANTIVERT) 25 MG tablet Take 25 mg by mouth 3 (three) times daily as needed (vertigo).  10/26/10  Yes Historical Provider, MD  meloxicam (MOBIC) 7.5 MG tablet Take 1 tablet (7.5 mg total) by mouth daily. 08/12/13  Yes Vickki HearingStanley E Athanas, MD  metFORMIN (GLUCOPHAGE) 500 MG tablet Take 500 mg by mouth 2 (two) times daily with a meal.     Yes Historical Provider, MD  omeprazole (PRILOSEC) 20 MG capsule Take 20 mg by mouth 2 (two) times daily.    Yes Historical Provider, MD  Probiotic Product (PROBIOTIC DAILY PO) Take 1 tablet by mouth daily.   Yes Historical Provider, MD  promethazine (PHENERGAN) 25 MG tablet Take 25 mg by mouth 2 (two) times daily as needed for nausea.  07/24/10  Yes Historical Provider, MD  Vitamin D, Ergocalciferol, (DRISDOL) 50000 UNITS CAPS capsule Take 50,000 Units by mouth every Wednesday.   Yes Historical Provider, MD  azithromycin (ZITHROMAX Z-PAK) 250 MG tablet 2 po day one, then 1 daily x 4 days 11/07/13   Flint MelterElliott L Geronimo Diliberto, MD   BP 142/84  Pulse 88  Temp(Src) 99.2 F (37.3 C) (Oral)  Resp 20  Ht 5' (1.524 m)  Wt 232 lb (105.235 kg)  BMI 45.31 kg/m2  SpO2 99%  Physical Exam  Nursing note and vitals reviewed. Constitutional: She is oriented to person, place, and time. She appears  well-developed and well-nourished.  HENT:  Head: Normocephalic and atraumatic.  Right Ear: Tympanic membrane and external ear normal.  Left Ear: Tympanic membrane and external ear normal.  No drainage. Slight red on the nasal mucosa. Slight injection of the oral pharynx. No swollen tonsils  or ulcers.   Eyes: Conjunctivae and EOM are normal. Pupils are equal, round, and reactive to light.  Neck: Normal range of motion and phonation normal. Neck supple.  Cardiovascular: Normal rate, regular rhythm and intact distal pulses.   Pulmonary/Chest: Effort normal and breath sounds normal. She has no wheezes. She has no rales. She exhibits no tenderness.  Abdominal: Soft. She exhibits no distension. There is no tenderness. There is no guarding.  Musculoskeletal: Normal range of motion.  Neurological: She is alert and oriented to person, place, and time. She exhibits normal muscle tone.  Skin: Skin is warm and dry.  Psychiatric: She has a normal mood and affect. Her behavior is normal. Judgment and thought content normal.    ED Course  Procedures (including critical care time) DIAGNOSTIC STUDIES: Oxygen Saturation is 99% on room air, normal by my interpretation.    COORDINATION OF CARE: 8:45 PM-Discussed treatment plan which includes CXR with pt at bedside and pt agreed to plan.   Medications  azithromycin (ZITHROMAX) tablet 500 mg (500 mg Oral Given 11/07/13 2321)    No data found.   10:25 PM Reevaluation with update and discussion. After initial assessment and treatment, an updated evaluation reveals that the pt has slight pnuemonia. Discussed the treatment plan with pt.  Elmin Wiederholt L    Labs Review Labs Reviewed - No data to display  Imaging Review Dg Chest 2 View  11/07/2013   CLINICAL DATA:  Productive cough, congestion, sore throat, hoarseness and fever.  EXAM: CHEST  2 VIEW  COMPARISON:  Chest radiograph performed 01/17/2011  FINDINGS: The lungs are well-aerated. Minimal right  basilar opacity may reflect atelectasis or possibly mild pneumonia. There is no evidence of pleural effusion or pneumothorax.  The heart is mildly enlarged. Degenerative change is noted along the thoracic spine. No acute osseous abnormalities are seen. There is elevation of the left hemidiaphragm, due to filling of the stomach with air. Thoracic spinal stimulation leads are partially imaged.  IMPRESSION: Minimal right basilar opacity may reflect atelectasis or possibly mild pneumonia, given the patient's symptoms. Mild cardiomegaly noted.   Electronically Signed   By: Roanna Raider M.D.   On: 11/07/2013 21:57     EKG Interpretation None      MDM   Final diagnoses:  Community acquired pneumonia  Laryngitis    CAP without sepsis or metabolic instability. Doubt ACS  Nursing Notes Reviewed/ Care Coordinated Applicable Imaging Reviewed Interpretation of Laboratory Data incorporated into ED treatment  The patient appears reasonably screened and/or stabilized for discharge and I doubt any other medical condition or other [facility] requiring further screening, evaluation, or treatment in the ED at this time prior to discharge.  Plan: Home Medications- Z-Pack; Home Treatments- rest; return here if the recommended treatment, does not improve the symptoms; Recommended follow up- PCP check up in 1 week.  I personally performed the services described in this documentation, which was scribed in my presence. The recorded information has been reviewed and is accurate.    Flint Melter, MD 11/10/13 [phone]

## 2013-11-07 NOTE — ED Notes (Signed)
Pt stated during discharge instructions that she was not satisfied with no medication administration before discharge. Pt had been given a prescription for full coverage of Zithromax and wanted her first dose here rather than waiting to fill the script. Spoke to EDP and first dose was ordered and given. Pt stated she was satisfied.

## 2013-11-07 NOTE — ED Notes (Signed)
Cough for 2-3 days, "thick mucus".  Hoarse,   Throat sore.  Diarrhea yesterday, Nausea, no vomiting.

## 2013-11-20 ENCOUNTER — Ambulatory Visit: Payer: Commercial Managed Care - HMO | Admitting: Gastroenterology

## 2013-11-20 DIAGNOSIS — K219 Gastro-esophageal reflux disease without esophagitis: Secondary | ICD-10-CM

## 2013-11-20 DIAGNOSIS — K589 Irritable bowel syndrome without diarrhea: Secondary | ICD-10-CM

## 2013-11-20 MED ORDER — DICYCLOMINE HCL 10 MG PO CAPS: ORAL_CAPSULE | ORAL | Status: DC

## 2013-11-20 NOTE — Assessment & Plan Note (Signed)
SX NOT IDEALLY CONTROLLED.  ADD BENTYL AVOID DAIRY CONTINUE OMEPRAZOLE OPV IN 4 MOS

## 2013-11-20 NOTE — Progress Notes (Signed)
Reminder in epic °

## 2013-11-20 NOTE — Addendum Note (Signed)
Addended by: West BaliFIELDS, Ahmet Schank L on: 11/20/2013 12:13 PM   Modules accepted: Orders

## 2013-11-20 NOTE — Progress Notes (Addendum)
Subjective:    Patient ID: Jeb Leveringnez H Froio, female    DOB: 04/12/1940, 73 y.o.   MRN: [id number]  Milana ObeyKNOWLTON,STEPHEN D, MD  HPI HUSBAND HAS TURNED FOR THE BETTER. STOMACH BEEN GIVING HER TROUBLE. 2-3 THINGS HAPPENING WITH HER. CARDIOLOGY SAID SHE MAY NEED A HEART CATH. JUST GETTING OVER PNA. WAS ON ZPAK. STOOLS: #5-6. LOOK LIKE MUCOUS. UNDER STRESS ABOUT GRANDBABY.    PT DENIES FEVER, CHILLS, HEMATOCHEZIA, nausea, vomiting, melena, diarrhea, CHEST PAIN, SHORTNESS OF BREATH,  CHANGE IN BOWEL IN HABITS, constipation, abdominal pain, problems swallowing, problems with sedation, heartburn or indigestion.   Past Medical History  Diagnosis Date  . Asthma   . Depression   . Type 2 diabetes mellitus with diabetic neuropathy   . GERD (gastroesophageal reflux disease)   . Essential hypertension, benign   . Low back pain   . IBS (irritable bowel syndrome)   . History of recurrent UTIs   . Cataract   . Glaucoma   . Arthritis   . Hx of migraines   . Carpal tunnel syndrome   . Morbid obesity   . Sleep apnea     Intolerant to CPAP   . Hx of colonic polyps   . Anxiety   . Carotid artery aneurysm     Right s/p endovascular treatment 2004  . Neurogenic bladder   . Stroke   . Osteoporosis   . Hypothyroidism   . CKD (chronic kidney disease) stage 3, GFR 30-59 ml/min    Past Surgical History  Procedure Laterality Date  . Vesicovaginal fistula closure w/ tah    . Carpal tunnel release - bilateral  1992  . Total hip replacement - right  2002  . Stomach surgery gastropexy for gastric volvulus  2009  . Tubular adenoma    . Shoulder surgery      Rotator cuff repair, 2012  . Spinal cord stimulator insertion    . Colonoscopy  06/14/2006    SLF:two 3 mm cecal polyps/pancolonic diverticulosis/sigmoid colon lipoma, tubular adenoma.   . Esophagogastroduodenoscopy  08/12/2007    NWG:NFAOZHSLF:Normal esophagus without evidence of Barrett's,mass, erosion ulceration or stricture.  The GE junction was at 40  cm.  The Bravo  capsule was placed at 34 cm/ Unable to appreciate a hiatal hernia in the retroflexed viewNormal duodenal bulb and second portion of the duodenum. mild gastritis.   . Colonoscopy N/A 03/04/2013    Procedure: COLONOSCOPY;  Surgeon: West BaliSandi L Crystie Yanko, MD;  Location: [facility];  Service: Endoscopy;  Laterality: N/A;  11:30-moved to 12:35 Soledad GerlachLeigh Ann to notify pt  . Cerebral aneurysm repair  2004    Coil   . Abdominal hysterectomy      No Known Allergies    Review of Systems     Objective:   Physical Exam  Vitals reviewed. Constitutional: She is oriented to person, place, and time. She appears well-developed and well-nourished. No distress.  HENT:  Head: Normocephalic and atraumatic.  Mouth/Throat: Oropharynx is clear and moist. No oropharyngeal exudate.  Eyes: Pupils are equal, round, and reactive to light. No scleral icterus.  Neck: Normal range of motion. Neck supple.  Cardiovascular: Normal rate, regular rhythm and normal heart sounds.   Pulmonary/Chest: Effort normal and breath sounds normal. No respiratory distress.  Abdominal: Soft. Bowel sounds are normal. She exhibits no distension. There is no tenderness.  Musculoskeletal: She exhibits no edema.  Lymphadenopathy:    She has no cervical adenopathy.  Neurological: She is alert and oriented to person,  place, and time.  Psychiatric: She has a normal mood and affect.          Assessment & Plan:

## 2013-11-20 NOTE — Assessment & Plan Note (Signed)
SX CONTROLLED.  LOW FAT DIET CONTINUE OMEPRAZOLE OPV IN 4 MOS

## 2013-11-20 NOTE — Addendum Note (Signed)
Addended by: West BaliFIELDS, SANDI L on: 11/20/2013 11:07 AM   Modules accepted: Level of Service

## 2013-11-20 NOTE — Patient Instructions (Signed)
USE BENTYL TO PREVENT LOOSE STOOLS AND ABDOMINAL CRAMPS.  AVOID DAIRY. SEE INFO BELOW.  CONTINUE OMEPRAZOLE.  CONTINUE YOUR WEIGHT LOSS EFFORTS. LOSE 10 LBS.  FOLLOW UP IN 4 MOS.     Lactose Free Diet Lactose is a carbohydrate that is found mainly in milk and milk products, as well as in foods with added milk or whey. Lactose must be digested by the enzyme in order to be used by the body. Lactose intolerance occurs when there is a shortage of lactase. When your body is not able to digest lactose, you may feel sick to your stomach (nausea), bloating, cramping, gas and diarrhea.  There are many dairy products that may be tolerated better than milk by some people:  The use of cultured dairy products such as yogurt, buttermilk, cottage cheese, and sweet acidophilus milk (Kefir) for lactase-deficient individuals is usually well tolerated. This is because the healthy bacteria help digest lactose.   Lactose-hydrolyzed milk (Lactaid) contains 40-90% less lactose than milk and may also be well tolerated.    SPECIAL NOTES  Lactose is a carbohydrates. The major food source is dairy products. Reading food labels is important. Many products contain lactose even when they are not made from milk. Look for the following words: whey, milk solids, dry milk solids, nonfat dry milk powder. Typical sources of lactose other than dairy products include breads, candies, cold cuts, prepared and processed foods, and commercial sauces and gravies.   All foods must be prepared without milk, cream, or other dairy foods.   Soy milk and lactose-free supplements (LACTASE) may be used as an alternative to milk.   FOOD GROUP ALLOWED/RECOMMENDED AVOID/USE SPARINGLY  BREADS / STARCHES 4 servings or more* Breads and rolls made without milk. [place]French, [place]ienna, or [place] bread. Breads and rolls that contain milk. Prepared mixes such as muffins, biscuits, waffles, pancakes. Sweet rolls, donuts, [place] toast (if made with  milk or lactose).  Crackers: Soda crackers, graham crackers. Any crackers prepared without lactose. Zwieback crackers, corn curls, or any that contain lactose.  Cereals: Cooked or dry cereals prepared without lactose (read labels). Cooked or dry cereals prepared with lactose (read labels). Total, Cocoa Krispies. Special K.  Potatoes / Pasta / Rice: Any prepared without milk or lactose. Popcorn. Instant potatoes, frozen JamaicaFrench fries, scalloped or au gratin potatoes.  VEGETABLES 2 servings or more Fresh, frozen, and canned vegetables. Creamed or breaded vegetables. Vegetables in a cheese sauce or with lactose-containing margarines.  FRUIT 2 servings or more All fresh, canned, or frozen fruits that are not processed with lactose. Any canned or frozen fruits processed with lactose.  MEAT & SUBSTITUTES 2 servings or more (4 to 6 oz. total per day) Plain beef, chicken, fish, Malaw[place]urkey, lamb, veal, pork, or ham. Kosher prepared meat products. Strained or junior meats that do not contain milk. Eggs, soy meat substitutes, nuts. Scrambled eggs, omelets, and souffles that contain milk. Creamed or breaded meat, fish, or fowl. Sausage products such as wieners, liver sausage, or cold cuts that contain milk solids. Cheese, cottage cheese, or cheese spreads.  MILK None. (See "BEVERAGES" for milk substitutes. See "DESSERTS" for ice cream and frozen desserts.) Milk (whole, 2%, skim, or chocolate). Evaporated, powdered, or condensed milk; malted milk.  SOUPS & COMBINATION FOODS Bouillon, broth, vegetable soups, clear soups, consomms. Homemade soups made with allowed ingredients. Combination or prepared foods that do not contain milk or milk products (read labels). Cream soups, chowders, commercially prepared soups containing lactose. Macaroni and cheese, pizza. Combination  or prepared foods that contain milk or milk products.  DESSERTS & SWEETS In moderation Water and fruit ices; gelatin; angel food cake. Homemade  cookies, pies, or cakes made from allowed ingredients. Pudding (if made with water or a milk substitute). Lactose-free tofu desserts. Sugar, honey, corn syrup, jam, jelly; marmalade; molasses (beet sugar); Pure sugar candy; marshmallows. Ice cream, ice milk, sherbet, custard, pudding, frozen yogurt. Commercial cake and cookie mixes. Desserts that contain chocolate. Pie crust made with milk-containing margarine; reduced-calorie desserts made with a sugar substitute that contains lactose. Toffee, peppermint, butterscotch, chocolate, caramels.  FATS & OILS In moderation Butter (as tolerated; contains very small amounts of lactose). Margarines and dressings that do not contain milk, Vegetable oils, shortening, Miracle Whip, mayonnaise, nondairy cream & whipped toppings without lactose or milk solids added (examples: Coffee Rich, Carnation Coffeemate, Rich's Whipped Topping, PolyRich). Tomasa Blase. Margarines and salad dressings containing milk; cream, cream cheese; peanut butter with added milk solids, sour cream, chip dips, made with sour cream.  BEVERAGES Carbonated drinks; tea; coffee and freeze-dried coffee; some instant coffees (check labels). Fruit drinks; fruit and vegetable juice; Rice or Soy milk. Ovaltine, hot chocolate. Some cocoas; some instant coffees; instant iced teas; powdered fruit drinks (read labels).   CONDIMENTS / MISCELLANEOUS Soy sauce, carob powder, olives, gravy made with water, baker's cocoa, pickles, pure seasonings and spices, wine, pure monosodium glutamate, catsup, mustard. Some chewing gums, chocolate, some cocoas. Certain antibiotics and vitamin / mineral preparations. Spice blends if they contain milk products. MSG extender. Artificial sweeteners that contain lactose such as Equal (Nutra-Sweet) and Sweet 'n Low. Some nondairy creamers (read labels).   SAMPLE MENU*  Breakfast   Orange Juice.  Banana.   Bran flakes.   Nondairy Creamer.  Vienna Bread (toasted).    Butter or milk-free margarine.   Coffee or tea.    Noon Meal   Chicken Breast.  Rice.   Green beans.   Butter or milk-free margarine.  Fresh melon.   Coffee or tea.    Evening Meal   Roast Beef.  Baked potato.   Butter or milk-free margarine.   Broccoli.   Lettuce salad with vinegar and oil dressing.  [employer].   Coffee or tea.

## 2013-11-24 ENCOUNTER — Telehealth: Payer: Self-pay

## 2013-11-24 NOTE — Telephone Encounter (Signed)
REVIEWED. AGREE. 

## 2013-11-24 NOTE — Telephone Encounter (Signed)
PT left Vm that the Bentyl was not at her pharmacy. It looks like Dr. Darrick Penna tried to send it on 11/20/2013. I called the prescription to April at the pharmacy. Bentyl 10 mg #60  One tablet qac and qhs prn with 5 refills.

## 2014-02-18 ENCOUNTER — Telehealth: Payer: Self-pay | Admitting: Orthopedic Surgery

## 2014-02-18 NOTE — Telephone Encounter (Signed)
Patient called asking to make an appointment with Dr. Romeo AppleHarrison for Bilateral knee an leg pain, unfortunately I explained to her that we needed a [facility] Referral from Dr. Osborne [name] Office approving referral. Patient stated she would contact her PCP.

## 2014-02-19 NOTE — Telephone Encounter (Signed)
Appointment made 02/23/14 we did receive [facility] referral from Dr. Sudie BaileyKnowlton

## 2014-02-23 ENCOUNTER — Encounter: Payer: Self-pay | Admitting: Orthopedic Surgery

## 2014-02-23 ENCOUNTER — Ambulatory Visit (INDEPENDENT_AMBULATORY_CARE_PROVIDER_SITE_OTHER): Payer: Commercial Managed Care - HMO | Admitting: Orthopedic Surgery

## 2014-02-23 VITALS — BP 144/82 | Ht 60.0 in | Wt 232.0 lb

## 2014-02-23 DIAGNOSIS — M170 Bilateral primary osteoarthritis of knee: Secondary | ICD-10-CM

## 2014-02-23 NOTE — Progress Notes (Signed)
Patient ID: [name], female   DOB: 08/19/1940, 73 y.o.   MRN: [id number] Chief Complaint  Patient presents with  . Follow-up     osteoarthritis bilateral knees    Patient did well last time with bilateral knee injections for bilateral knee arthritis we also put her on an anti-inflammatory as well as Tylenol 3 every 6 hours for pain  She presents requesting bilateral knee injections again   Procedure note left knee injection verbal consent was obtained to inject left knee joint  Timeout was completed to confirm the site of injection  The medications used were 40 mg of Depo-Medrol and 1% lidocaine 3 cc  Anesthesia was provided by ethyl chloride and the skin was prepped with alcohol.  After cleaning the skin with alcohol a 20-gauge needle was used to inject the left knee joint. There were no complications. A sterile bandage was applied.   Procedure note right knee injection verbal consent was obtained to inject right knee joint  Timeout was completed to confirm the site of injection  The medications used were 40 mg of Depo-Medrol and 1% lidocaine 3 cc  Anesthesia was provided by ethyl chloride and the skin was prepped with alcohol.  After cleaning the skin with alcohol a 20-gauge needle was used to inject the right knee joint. There were no complications. A sterile bandage was applied.

## 2014-03-03 ENCOUNTER — Encounter (HOSPITAL_COMMUNITY): Payer: Self-pay | Admitting: Emergency Medicine

## 2014-03-03 ENCOUNTER — Emergency Department (HOSPITAL_COMMUNITY)
Admission: EM | Admit: 2014-03-03 | Discharge: 2014-03-03 | Disposition: A | Discharge status: Home / Self Care | Payer: Commercial Managed Care - HMO | Attending: Emergency Medicine | Admitting: Emergency Medicine

## 2014-03-03 ENCOUNTER — Emergency Department (HOSPITAL_COMMUNITY): Payer: Commercial Managed Care - HMO

## 2014-03-03 DIAGNOSIS — K219 Gastro-esophageal reflux disease without esophagitis: Secondary | ICD-10-CM | POA: Diagnosis not present

## 2014-03-03 DIAGNOSIS — H409 Unspecified glaucoma: Secondary | ICD-10-CM | POA: Insufficient documentation

## 2014-03-03 DIAGNOSIS — F329 Major depressive disorder, single episode, unspecified: Secondary | ICD-10-CM | POA: Insufficient documentation

## 2014-03-03 DIAGNOSIS — R2242 Localized swelling, mass and lump, left lower limb: Secondary | ICD-10-CM | POA: Diagnosis present

## 2014-03-03 DIAGNOSIS — Z87891 Personal history of nicotine dependence: Secondary | ICD-10-CM | POA: Diagnosis not present

## 2014-03-03 DIAGNOSIS — I129 Hypertensive chronic kidney disease with stage 1 through stage 4 chronic kidney disease, or unspecified chronic kidney disease: Secondary | ICD-10-CM | POA: Diagnosis not present

## 2014-03-03 DIAGNOSIS — Z79899 Other long term (current) drug therapy: Secondary | ICD-10-CM | POA: Insufficient documentation

## 2014-03-03 DIAGNOSIS — M1000 Idiopathic gout, unspecified site: Secondary | ICD-10-CM | POA: Diagnosis not present

## 2014-03-03 DIAGNOSIS — Z791 Long term (current) use of non-steroidal anti-inflammatories (NSAID): Secondary | ICD-10-CM | POA: Diagnosis not present

## 2014-03-03 DIAGNOSIS — N183 Chronic kidney disease, stage 3 (moderate): Secondary | ICD-10-CM | POA: Diagnosis not present

## 2014-03-03 DIAGNOSIS — Z792 Long term (current) use of antibiotics: Secondary | ICD-10-CM | POA: Diagnosis not present

## 2014-03-03 DIAGNOSIS — J45909 Unspecified asthma, uncomplicated: Secondary | ICD-10-CM | POA: Diagnosis not present

## 2014-03-03 DIAGNOSIS — Z8673 Personal history of transient ischemic attack (TIA), and cerebral infarction without residual deficits: Secondary | ICD-10-CM | POA: Diagnosis not present

## 2014-03-03 DIAGNOSIS — G43909 Migraine, unspecified, not intractable, without status migrainosus: Secondary | ICD-10-CM | POA: Insufficient documentation

## 2014-03-03 DIAGNOSIS — E1140 Type 2 diabetes mellitus with diabetic neuropathy, unspecified: Secondary | ICD-10-CM | POA: Insufficient documentation

## 2014-03-03 DIAGNOSIS — R52 Pain, unspecified: Secondary | ICD-10-CM

## 2014-03-03 DIAGNOSIS — M1990 Unspecified osteoarthritis, unspecified site: Secondary | ICD-10-CM | POA: Diagnosis not present

## 2014-03-03 DIAGNOSIS — E039 Hypothyroidism, unspecified: Secondary | ICD-10-CM | POA: Insufficient documentation

## 2014-03-03 LAB — COMPREHENSIVE METABOLIC PANEL
ALT: 20 U/L (ref 0–35)
ANION GAP: 14 (ref 5–15)
AST: 18 U/L (ref 0–37)
Albumin: 3.5 g/dL (ref 3.5–5.2)
Alkaline Phosphatase: 119 U/L — ABNORMAL HIGH (ref 39–117)
BUN: 18 mg/dL (ref 6–23)
CO2: 21 meq/L (ref 19–32)
Calcium: 8.1 mg/dL — ABNORMAL LOW (ref 8.4–10.5)
Chloride: 105 mEq/L (ref 96–112)
Creatinine, Ser: 1.42 mg/dL — ABNORMAL HIGH (ref 0.50–1.10)
GFR calc Af Amer: 41 mL/min — ABNORMAL LOW (ref >90)
GFR calc non Af Amer: 35 mL/min — ABNORMAL LOW (ref >90)
Glucose, Bld: 242 mg/dL — ABNORMAL HIGH (ref 70–99)
Potassium: 4.3 mEq/L (ref 3.7–5.3)
Sodium: 140 mEq/L (ref 137–147)
TOTAL PROTEIN: 7.7 g/dL (ref 6.0–8.3)
Total Bilirubin: 0.3 mg/dL (ref 0.3–1.2)

## 2014-03-03 LAB — CBC WITH DIFFERENTIAL/PLATELET
Basophils Absolute: 0 10*3/uL (ref 0.0–0.1)
Basophils Relative: 0 % (ref 0–1)
EOS PCT: 0 % (ref 0–5)
Eosinophils Absolute: 0 10*3/uL (ref 0.0–0.7)
HEMATOCRIT: 34.8 % — AB (ref 36.0–46.0)
HEMOGLOBIN: 11.3 g/dL — AB (ref 12.0–15.0)
LYMPHS PCT: 35 % (ref 12–46)
Lymphs Abs: 2.8 10*3/uL (ref 0.7–4.0)
MCH: 28 pg (ref 26.0–34.0)
MCHC: 32.5 g/dL (ref 30.0–36.0)
MCV: 86.4 fL (ref 78.0–100.0)
MONO ABS: 0.4 10*3/uL (ref 0.1–1.0)
MONOS PCT: 5 % (ref 3–12)
Neutro Abs: 4.6 10*3/uL (ref 1.7–7.7)
Neutrophils Relative %: 60 % (ref 43–77)
Platelets: 269 10*3/uL (ref 150–400)
RBC: 4.03 MIL/uL (ref 3.87–5.11)
RDW: 15.1 % (ref 11.5–15.5)
WBC: 7.8 10*3/uL (ref 4.0–10.5)

## 2014-03-03 LAB — URIC ACID: Uric Acid, Serum: 8.4 mg/dL — ABNORMAL HIGH (ref 2.4–7.0)

## 2014-03-03 MED ORDER — HYDROCODONE-ACETAMINOPHEN 5-325 MG PO TABS: 1.0000 | ORAL_TABLET | Freq: Four times a day (QID) | ORAL | Status: DC | PRN

## 2014-03-03 MED ORDER — CELECOXIB 100 MG PO CAPS: 100.0000 mg | ORAL_CAPSULE | Freq: Two times a day (BID) | ORAL | Status: DC

## 2014-03-03 NOTE — Discharge Instructions (Signed)
Follow up with your family md next week. °

## 2014-03-03 NOTE — ED Notes (Addendum)
Pt reports bilateral feet swelling and increased urination for last several months. Pt reports seen for same at pcp. nad noted. Pt denies any known injury.

## 2014-03-03 NOTE — ED Notes (Signed)
Pt verbalized understanding of no driving and to use caution within 4 hours of taking pain meds due to meds cause drowsiness 

## 2014-03-03 NOTE — ED Provider Notes (Signed)
CSN: [id number]     Arrival date & time 03/03/14  4098 History  This chart was scribed for Benny Lennert, MD by Annye Asa, ED Scribe. This patient was seen in room APA12/AP[facility]12 and the patient's care was started at 10:17 AM.    Chief Complaint  Patient presents with  . Leg Swelling   Patient is a 73 y.o. female presenting with lower extremity pain. The history is provided by the patient (pt complains of bilateral foot pain (R>L)). No language interpreter was used.  Foot Pain This is a recurrent problem. The current episode started more than 1 week ago. The problem occurs constantly. The problem has been gradually worsening. Pertinent negatives include no chest pain, no abdominal pain and no headaches. Nothing aggravates the symptoms. Nothing relieves the symptoms. She has tried nothing for the symptoms.    HPI Comments: [name] is a 73 y.o. female who presents to the Emergency Department complaining of several months of worsening, intermittent stabbing pains and swelling in her feet (first two toes of each foot, right foot worse than left). She denies any known injury.   PCP is Milana Obey, MD   Past Medical History  Diagnosis Date  . Asthma   . Depression   . Type 2 diabetes mellitus with diabetic neuropathy   . GERD (gastroesophageal reflux disease)   . Essential hypertension, benign   . Low back pain   . IBS (irritable bowel syndrome)   . History of recurrent UTIs   . Cataract   . Glaucoma   . Arthritis   . Hx of migraines   . Carpal tunnel syndrome   . Morbid obesity   . Sleep apnea     Intolerant to CPAP   . Hx of colonic polyps   . Anxiety   . Carotid artery aneurysm     Right s/p endovascular treatment 2004  . Neurogenic bladder   . Stroke   . Osteoporosis   . Hypothyroidism   . CKD (chronic kidney disease) stage 3, GFR 30-59 ml/min    Past Surgical History  Procedure Laterality Date  . Vesicovaginal fistula closure w/ tah    . Carpal tunnel  release - bilateral  1992  . Total hip replacement - right  2002  . Stomach surgery gastropexy for gastric volvulus  2009  . Tubular adenoma    . Shoulder surgery      Rotator cuff repair, 2012  . Spinal cord stimulator insertion    . Colonoscopy  06/14/2006    SLF:two 3 mm cecal polyps/pancolonic diverticulosis/sigmoid colon lipoma, tubular adenoma.   . Esophagogastroduodenoscopy  08/12/2007    JXB:JYNWGN esophagus without evidence of Barrett's,mass, erosion ulceration or stricture.  The GE junction was at 40 cm.  The Bravo  capsule was placed at 34 cm/ Unable to appreciate a hiatal hernia in the retroflexed viewNormal duodenal bulb and second portion of the duodenum. mild gastritis.   . Colonoscopy N/A 03/04/2013    Procedure: COLONOSCOPY;  Surgeon: West Bali, MD;  Location: [facility];  Service: Endoscopy;  Laterality: N/A;  11:30-moved to 12:35 Soledad Gerlach to notify pt  . Cerebral aneurysm repair  2004    Coil   . Abdominal hysterectomy     Family History  Problem Relation Age of Onset  . Heart disease Mother   . Cancer Mother   . Diabetes Brother   . Cancer Brother     Prostate   . Cancer Brother  Pancreatic   . Cancer Sister     Breast   . Heart attack Sister   . Lung disease    . Asthma    . Colon cancer Neg Hx    History  Substance Use Topics  . Smoking status: Former Smoker    Types: Cigarettes  . Smokeless tobacco: Never Used  . Alcohol Use: No   OB History    No data available     Review of Systems  Constitutional: Negative for appetite change and fatigue.  HENT: Negative for congestion, ear discharge and sinus pressure.   Eyes: Negative for discharge.  Respiratory: Negative for cough.   Cardiovascular: Negative for chest pain.  Gastrointestinal: Negative for abdominal pain and diarrhea.  Genitourinary: Negative for frequency and hematuria.  Musculoskeletal: Positive for joint swelling and arthralgias (Bilateral foot pain). Negative for back  pain.  Skin: Negative for rash.  Neurological: Negative for seizures and headaches.  Psychiatric/Behavioral: Negative for hallucinations.    Allergies  Review of patient's allergies indicates no known allergies.  Home Medications   Prior to Admission medications   Medication Sig Start Date End Date Taking? Authorizing Provider  alendronate (FOSAMAX) 70 MG tablet Take 70 mg by mouth every Wednesday. Take with a full glass of water on an empty stomach.    Historical Provider, MD  azithromycin (ZITHROMAX Z-PAK) 250 MG tablet 2 po day one, then 1 daily x 4 days 11/07/13   Flint MelterElliott L Wentz, MD  dicyclomine (BENTYL) 10 MG capsule 1 po qac and hs AS NEEDED FOR ABDOMINAL CRAMPS AND LOOSE STOOLS 11/20/13   West BaliSandi L Fields, MD  doxepin (SINEQUAN) 25 MG capsule Take 100 mg by mouth at bedtime.  06/30/10   Historical Provider, MD  furosemide (LASIX) 20 MG tablet Take 20 mg by mouth daily as needed for fluid.     Historical Provider, MD  gabapentin (NEURONTIN) 100 MG capsule Take 100 mg by mouth 2 (two) times daily as needed (pain).    Historical Provider, MD  glimepiride (AMARYL) 4 MG tablet Take 4 mg by mouth daily.  06/30/10   Historical Provider, MD  levothyroxine (SYNTHROID, LEVOTHROID) 50 MCG tablet Take 50 mcg by mouth daily before breakfast.    Historical Provider, MD  losartan (COZAAR) 25 MG tablet Take 25 mg by mouth daily.  04/29/13   Historical Provider, MD  meclizine (ANTIVERT) 25 MG tablet Take 25 mg by mouth 3 (three) times daily as needed (vertigo).  10/26/10   Historical Provider, MD  meloxicam (MOBIC) 7.5 MG tablet Take 1 tablet (7.5 mg total) by mouth daily. 08/12/13   Vickki HearingStanley E Sackrider, MD  metFORMIN (GLUCOPHAGE) 500 MG tablet Take 500 mg by mouth 2 (two) times daily with a meal.      Historical Provider, MD  omeprazole (PRILOSEC) 20 MG capsule Take 20 mg by mouth 2 (two) times daily.     Historical Provider, MD  Probiotic Product (PROBIOTIC DAILY PO) Take 1 tablet by mouth daily.     Historical Provider, MD  promethazine (PHENERGAN) 25 MG tablet Take 25 mg by mouth 2 (two) times daily as needed for nausea.  07/24/10   Historical Provider, MD  Vitamin D, Ergocalciferol, (DRISDOL) 50000 UNITS CAPS capsule Take 50,000 Units by mouth every Wednesday.    Historical Provider, MD   BP 124/94 mmHg  Pulse 76  Temp(Src) 97.8 F (36.6 C) (Oral)  Resp 18  Ht 5' (1.524 m)  Wt 235 lb (106.595 kg)  BMI 45.90 kg/m2  SpO2 97% Physical Exam  Constitutional: She is oriented to person, place, and time. She appears well-developed.  HENT:  Head: Normocephalic.  Eyes: Conjunctivae are normal.  Neck: No tracheal deviation present.  Cardiovascular:  No murmur heard. Musculoskeletal: Normal range of motion.  Tenderness at first metatarsal bilaterally; bunions on both feet  Neurological: She is oriented to person, place, and time.  Skin: Skin is warm.  Psychiatric: She has a normal mood and affect.    ED Course  Procedures   DIAGNOSTIC STUDIES: Oxygen Saturation is 97% on RA, adequate by my interpretation.    COORDINATION OF CARE: 10:18 AM Discussed treatment plan with pt at bedside and pt agreed to plan.   Labs Review Labs Reviewed - No data to display  Imaging Review No results found.   EKG Interpretation None      MDM   Final diagnoses:  None  The chart was scribed for me under my direct supervision.  I personally performed the history, physical, and medical decision making and all procedures in the evaluation of this patient.Benny Lennert.      Tequila Rottmann L Abdurrahman Petersheim, MD 03/03/14 [id number]

## 2014-03-16 ENCOUNTER — Other Ambulatory Visit: Payer: Self-pay | Admitting: *Deleted

## 2014-03-16 DIAGNOSIS — M170 Bilateral primary osteoarthritis of knee: Secondary | ICD-10-CM

## 2014-03-16 MED ORDER — ACETAMINOPHEN-CODEINE 300-30 MG PO TABS: 1.0000 | ORAL_TABLET | Freq: Four times a day (QID) | ORAL | Status: DC | PRN

## 2014-04-07 ENCOUNTER — Telehealth: Payer: Self-pay | Admitting: Orthopedic Surgery

## 2014-04-07 NOTE — Telephone Encounter (Signed)
Patient called to inquire about appointment for same problems of swelling knees, ankles, feet, and hands; states has also been to Emergency Room.  Per last visit, 02/23/14, patient had received injections and medications, which helped for a short while.  States was told at Emergency Room that main problem is "gout." Relayed to patient to contact primary care physician to be further evaluated and advised.

## 2014-04-14 ENCOUNTER — Telehealth: Payer: Self-pay | Admitting: Orthopedic Surgery

## 2014-04-14 NOTE — Telephone Encounter (Signed)
Received call from Okey Regalarol at Dr Michelle NasutiKnowlton's office, patient's primary care, Dr Michelle NasutiKnowlton's office; asking about Dr Mort SawyersHarrison's prescribing medication for treatment of knee pain, and whether Dr Romeo AppleHarrison is managing her pain.  Patient has had further treatment since last visit here;  please advise.  Their office # is [phone].

## 2014-04-14 NOTE — Telephone Encounter (Signed)
No we are not managing pain, was referred back to PCP

## 2014-04-15 ENCOUNTER — Other Ambulatory Visit: Payer: Self-pay | Admitting: *Deleted

## 2014-04-15 ENCOUNTER — Encounter (HOSPITAL_COMMUNITY): Payer: Self-pay | Admitting: Emergency Medicine

## 2014-04-15 ENCOUNTER — Emergency Department (HOSPITAL_COMMUNITY)
Admission: EM | Admit: 2014-04-15 | Discharge: 2014-04-15 | Disposition: A | Discharge status: Home / Self Care | Payer: Medicare HMO | Attending: Emergency Medicine | Admitting: Emergency Medicine

## 2014-04-15 DIAGNOSIS — E039 Hypothyroidism, unspecified: Secondary | ICD-10-CM | POA: Diagnosis not present

## 2014-04-15 DIAGNOSIS — G43909 Migraine, unspecified, not intractable, without status migrainosus: Secondary | ICD-10-CM | POA: Insufficient documentation

## 2014-04-15 DIAGNOSIS — J45909 Unspecified asthma, uncomplicated: Secondary | ICD-10-CM | POA: Diagnosis not present

## 2014-04-15 DIAGNOSIS — Z79899 Other long term (current) drug therapy: Secondary | ICD-10-CM | POA: Insufficient documentation

## 2014-04-15 DIAGNOSIS — L732 Hidradenitis suppurativa: Secondary | ICD-10-CM | POA: Diagnosis not present

## 2014-04-15 DIAGNOSIS — Z86010 Personal history of colonic polyps: Secondary | ICD-10-CM | POA: Diagnosis not present

## 2014-04-15 DIAGNOSIS — Z8673 Personal history of transient ischemic attack (TIA), and cerebral infarction without residual deficits: Secondary | ICD-10-CM | POA: Diagnosis not present

## 2014-04-15 DIAGNOSIS — Z87440 Personal history of urinary (tract) infections: Secondary | ICD-10-CM | POA: Insufficient documentation

## 2014-04-15 DIAGNOSIS — M1990 Unspecified osteoarthritis, unspecified site: Secondary | ICD-10-CM | POA: Insufficient documentation

## 2014-04-15 DIAGNOSIS — N183 Chronic kidney disease, stage 3 (moderate): Secondary | ICD-10-CM | POA: Diagnosis not present

## 2014-04-15 DIAGNOSIS — F419 Anxiety disorder, unspecified: Secondary | ICD-10-CM | POA: Insufficient documentation

## 2014-04-15 DIAGNOSIS — K219 Gastro-esophageal reflux disease without esophagitis: Secondary | ICD-10-CM | POA: Insufficient documentation

## 2014-04-15 DIAGNOSIS — Z791 Long term (current) use of non-steroidal anti-inflammatories (NSAID): Secondary | ICD-10-CM | POA: Insufficient documentation

## 2014-04-15 DIAGNOSIS — I129 Hypertensive chronic kidney disease with stage 1 through stage 4 chronic kidney disease, or unspecified chronic kidney disease: Secondary | ICD-10-CM | POA: Insufficient documentation

## 2014-04-15 DIAGNOSIS — L02214 Cutaneous abscess of groin: Secondary | ICD-10-CM | POA: Diagnosis present

## 2014-04-15 DIAGNOSIS — Z87448 Personal history of other diseases of urinary system: Secondary | ICD-10-CM | POA: Diagnosis not present

## 2014-04-15 DIAGNOSIS — E119 Type 2 diabetes mellitus without complications: Secondary | ICD-10-CM | POA: Diagnosis not present

## 2014-04-15 DIAGNOSIS — Z87891 Personal history of nicotine dependence: Secondary | ICD-10-CM | POA: Diagnosis not present

## 2014-04-15 DIAGNOSIS — M810 Age-related osteoporosis without current pathological fracture: Secondary | ICD-10-CM | POA: Insufficient documentation

## 2014-04-15 LAB — CBG MONITORING, ED: Glucose-Capillary: 264 mg/dL — ABNORMAL HIGH (ref 70–99)

## 2014-04-15 MED ORDER — POVIDONE-IODINE 10 % EX SOLN
CUTANEOUS | Status: AC
  Administered 2014-04-15: 18:00:00
  Filled 2014-04-15: qty 118

## 2014-04-15 MED ORDER — SULFAMETHOXAZOLE-TRIMETHOPRIM 800-160 MG PO TABS: 1.0000 | ORAL_TABLET | Freq: Two times a day (BID) | ORAL | Status: AC

## 2014-04-15 MED ORDER — SULFAMETHOXAZOLE-TRIMETHOPRIM 800-160 MG PO TABS
1.0000 | ORAL_TABLET | Freq: Once | ORAL | Status: AC
  Administered 2014-04-15: 1 via ORAL
  Filled 2014-04-15: qty 1

## 2014-04-15 MED ORDER — HYDROCODONE-ACETAMINOPHEN 5-325 MG PO TABS
1.0000 | ORAL_TABLET | Freq: Once | ORAL | Status: AC
  Administered 2014-04-15: 1 via ORAL
  Filled 2014-04-15: qty 1

## 2014-04-15 MED ORDER — LIDOCAINE-EPINEPHRINE (PF) 1 %-1:200000 IJ SOLN
10.0000 mL | Freq: Once | INTRAMUSCULAR | Status: DC
  Filled 2014-04-15: qty 10

## 2014-04-15 MED ORDER — HYDROCODONE-ACETAMINOPHEN 5-325 MG PO TABS: 1.0000 | ORAL_TABLET | Freq: Four times a day (QID) | ORAL | Status: DC | PRN

## 2014-04-15 MED ORDER — LIDOCAINE HCL (PF) 1 % IJ SOLN
INTRAMUSCULAR | Status: AC
  Filled 2014-04-15: qty 5

## 2014-04-15 NOTE — ED Notes (Signed)
[facility]- [id number] 

## 2014-04-15 NOTE — Telephone Encounter (Signed)
[name] from Dr Osborne [name] office aware

## 2014-04-15 NOTE — Discharge Instructions (Signed)
Hidradenitis Suppurativa, Sweat Gland Abscess Hidradenitis suppurativa is a long lasting (chronic), uncommon disease of the sweat glands. With this, boil-like lumps and scarring develop in the groin, some times under the arms (axillae), and under the breasts. It may also uncommonly occur behind the ears, in the crease of the buttocks, and around the genitals.  CAUSES  The cause is from a blocking of the sweat glands. They then become infected. It may cause drainage and odor. It is not contagious. So it cannot be given to someone else. It most often shows up in puberty (about 10 to 76 years of age). But it may happen much later. It is similar to acne which is a disease of the sweat glands. This condition is slightly more common in African-Americans and women. SYMPTOMS   Hidradenitis usually starts as one or more red, tender, swellings in the groin or under the arms (axilla).  Over a period of hours to days the lesions get larger. They often open to the skin surface, draining clear to yellow-colored fluid.  The infected area heals with scarring. DIAGNOSIS  Your caregiver makes this diagnosis by looking at you. Sometimes cultures (growing germs on plates in the lab) may be taken. This is to see what germ (bacterium) is causing the infection.  TREATMENT   Topical germ killing medicine applied to the skin (antibiotics) are the treatment of choice. Antibiotics taken by mouth (systemic) are sometimes needed when the condition is getting worse or is severe.  Avoid tight-fitting clothing which traps moisture in.  Dirt does not cause hidradenitis and it is not caused by poor hygiene.  Involved areas should be cleaned daily using an antibacterial soap. Some patients find that the liquid form of Lever 2000, applied to the involved areas as a lotion after bathing, can help reduce the odor related to this condition.  Sometimes surgery is needed to drain infected areas or remove scarred tissue. Removal of  large amounts of tissue is used only in severe cases.  Birth control pills may be helpful.  Oral retinoids (vitamin A derivatives) for 6 to 12 months which are effective for acne may also help this condition.  Weight loss will improve but not cure hidradenitis. It is made worse by being overweight. But the condition is not caused by being overweight.  This condition is more common in people who have had acne.  It may become worse under stress. There is no medical cure for hidradenitis. It can be controlled, but not cured. The condition usually continues for years with periods of getting worse and getting better (remission). Document Released: 11/02/2003 Document Revised: 06/12/2011 Document Reviewed: 06/20/2013 [employer] Patient Information 2015 [employer]. This information is not intended to replace advice given to you by your health care provider. Make sure you discuss any questions you have with your health care provider.  

## 2014-04-15 NOTE — ED Notes (Signed)
PT states abscess x5 to perineal area and right groin. PT denies any fevers at home.

## 2014-04-15 NOTE — ED Provider Notes (Signed)
TIME SEEN: 5:30 PM  CHIEF COMPLAINT: Abscess  HPI: Pt is a 74 y.o. female with history of diabetes, hypothyroidism, chronic kidney disease, obesity, hypertension who presents emergency department with multiple abscesses to her labia and groin region for the past several days. She states she had her husband cut some of these open and has had some purulent drainage. States that there is still some areas that need to be open. States she has been seen by Dr. Lovell Sheehan with general surgery for this in the past. She is not sure if she is ever been diagnosed with hidradenitis. Denies any fevers, chills, nausea, vomiting, diarrhea. Reports her blood sugar has been well controlled. Reports she is otherwise feeling well. Her dysuria or hematuria. No vaginal bleeding.  ROS: See HPI Constitutional: no fever  Eyes: no drainage  ENT: no runny nose   Cardiovascular:  no chest pain  Resp: no SOB  GI: no vomiting GU: no dysuria Integumentary: no rash  Allergy: no hives  Musculoskeletal: no leg swelling  Neurological: no slurred speech ROS otherwise negative  PAST MEDICAL HISTORY/PAST SURGICAL HISTORY:  Past Medical History  Diagnosis Date  . Asthma   . Depression   . Type 2 diabetes mellitus with diabetic neuropathy   . GERD (gastroesophageal reflux disease)   . Essential hypertension, benign   . Low back pain   . IBS (irritable bowel syndrome)   . History of recurrent UTIs   . Cataract   . Glaucoma   . Arthritis   . Hx of migraines   . Carpal tunnel syndrome   . Morbid obesity   . Sleep apnea     Intolerant to CPAP   . Hx of colonic polyps   . Anxiety   . Carotid artery aneurysm     Right s/p endovascular treatment 2004  . Neurogenic bladder   . Stroke   . Osteoporosis   . Hypothyroidism   . CKD (chronic kidney disease) stage 3, GFR 30-59 ml/min     MEDICATIONS:  Prior to Admission medications   Medication Sig Start Date End Date Taking? Authorizing Provider  Acetaminophen-Codeine  300-30 MG per tablet Take 1 tablet by mouth every 6 (six) hours as needed for pain. 03/16/14   Vickki Hearing, MD  alendronate (FOSAMAX) 70 MG tablet Take 70 mg by mouth every Wednesday. Take with a full glass of water on an empty stomach.    Historical Provider, MD  amLODipine (NORVASC) 10 MG tablet Take 10 mg by mouth daily. 02/05/14   Historical Provider, MD  azithromycin (ZITHROMAX Z-PAK) 250 MG tablet 2 po day one, then 1 daily x 4 days Patient not taking: Reported on 03/03/2014 11/07/13   Flint Melter, MD  celecoxib (CELEBREX) 100 MG capsule Take 1 capsule (100 mg total) by mouth 2 (two) times daily. 03/03/14   Benny Lennert, MD  dicyclomine (BENTYL) 10 MG capsule 1 po qac and hs AS NEEDED FOR ABDOMINAL CRAMPS AND LOOSE STOOLS Patient taking differently: Take 10 mg by mouth 4 (four) times daily -  before meals and at bedtime. ABDOMINAL CRAMPS AND LOOSE STOOLS 11/20/13   West Bali, MD  doxepin (SINEQUAN) 25 MG capsule Take 100 mg by mouth at bedtime.  06/30/10   Historical Provider, MD  furosemide (LASIX) 20 MG tablet Take 20 mg by mouth daily as needed for fluid.     Historical Provider, MD  gabapentin (NEURONTIN) 100 MG capsule Take 100 mg by mouth 2 (two) times daily  as needed (pain).    Historical Provider, MD  glimepiride (AMARYL) 4 MG tablet Take 4 mg by mouth daily.  06/30/10   Historical Provider, MD  HYDROcodone-acetaminophen (NORCO/VICODIN) 5-325 MG per tablet Take 1 tablet by mouth every 6 (six) hours as needed. 03/03/14   Benny Lennert, MD  levothyroxine (SYNTHROID, LEVOTHROID) 50 MCG tablet Take 50 mcg by mouth daily before breakfast.    Historical Provider, MD  levothyroxine (SYNTHROID, LEVOTHROID) 75 MCG tablet Take 75 mcg by mouth daily. 02/27/14   Historical Provider, MD  losartan (COZAAR) 25 MG tablet Take 25 mg by mouth daily.  04/29/13   Historical Provider, MD  meclizine (ANTIVERT) 25 MG tablet Take 25 mg by mouth 3 (three) times daily as needed (vertigo).  10/26/10    Historical Provider, MD  meloxicam (MOBIC) 7.5 MG tablet Take 1 tablet (7.5 mg total) by mouth daily. 08/12/13   Vickki Hearing, MD  metFORMIN (GLUCOPHAGE) 500 MG tablet Take 500 mg by mouth 2 (two) times daily with a meal.      Historical Provider, MD  omeprazole (PRILOSEC) 20 MG capsule Take 20 mg by mouth 2 (two) times daily.     Historical Provider, MD  Probiotic Product (PROBIOTIC DAILY PO) Take 1 tablet by mouth daily.    Historical Provider, MD  promethazine (PHENERGAN) 25 MG tablet Take 25 mg by mouth 2 (two) times daily as needed for nausea.  07/24/10   Historical Provider, MD  Vitamin D, Ergocalciferol, (DRISDOL) 50000 UNITS CAPS capsule Take 50,000 Units by mouth every Wednesday.    Historical Provider, MD    ALLERGIES:  No Known Allergies  SOCIAL HISTORY:  History  Substance Use Topics  . Smoking status: Former Smoker    Types: Cigarettes  . Smokeless tobacco: Never Used  . Alcohol Use: No    FAMILY HISTORY: Family History  Problem Relation Age of Onset  . Heart disease Mother   . Cancer Mother   . Diabetes Brother   . Cancer Brother     Prostate   . Cancer Brother     Pancreatic   . Cancer Sister     Breast   . Heart attack Sister   . Lung disease    . Asthma    . Colon cancer Neg Hx     EXAM: BP 150/81 mmHg  Pulse 88  Temp(Src) 98.2 F (36.8 C) (Oral)  Resp 19  Ht 5' (1.524 m)  Wt 230 lb (104.327 kg)  BMI 44.92 kg/m2  SpO2 99% CONSTITUTIONAL: Alert and oriented and responds appropriately to questions. Well-appearing; well-nourished HEAD: Normocephalic EYES: Conjunctivae clear, PERRL ENT: normal nose; no rhinorrhea; moist mucous membranes; pharynx without lesions noted NECK: Supple, no meningismus, no LAD  CARD: RRR; S1 and S2 appreciated; no murmurs, no clicks, no rubs, no gallops RESP: Normal chest excursion without splinting or tachypnea; breath sounds clear and equal bilaterally; no wheezes, no rhonchi, no rales,  ABD/GI: Normal bowel sounds;  non-distended; soft, non-tender, no rebound, no guarding BACK:  The back appears normal and is non-tender to palpation, there is no CVA tenderness EXT: Normal ROM in all joints; non-tender to palpation; no edema; normal capillary refill; no cyanosis    SKIN: Normal color for age and race; warm; multiple less than 1 cm fluctuant areas some with drainage and some without to the bilateral inner thighs and labia majora, minimal surrounding erythema, no crepitus, patient does have some peeling of the skin of the right medial proximal thigh  NEURO: Moves all extremities equally PSYCH: The patient's mood and manner are appropriate. Grooming and personal hygiene are appropriate.  MEDICAL DECISION MAKING: Patient here with what appears to be hidradenitis. There are no lesions in her perineum despite nursing notes. All abscesses aren't located on her inner thighs and labia majora. No sign of necrotizing fasciitis. I have opened some of the small areas up with good drainage. Will discharge home on Bactrim. We'll have her follow-up with general surgery. She is otherwise well-appearing, nontoxic, afebrile. Discussed return precautions. She verbalized understanding and is comfortable with plan.     INCISION AND DRAINAGE Performed by: Raelyn NumberWARD, Jaymere Alen N Consent: Verbal consent obtained. Risks and benefits: risks, benefits and alternatives were discussed Type: abscess  Body area: Bilateral medial thighs and labia  Anesthesia: local infiltration  Incision was made with a scalpel.  Local anesthetic: lidocaine 1 % with epinephrine  Anesthetic total: 5 ml  Complexity: complex Blunt dissection to break up loculations  Drainage: purulent  Drainage amount: Small     Patient tolerance: Patient tolerated the procedure well with no immediate complications.     Layla MawKristen N Sallie Maker, DO 04/15/14 2153

## 2014-04-17 ENCOUNTER — Emergency Department (HOSPITAL_COMMUNITY)
Admission: EM | Admit: 2014-04-17 | Discharge: 2014-04-17 | Disposition: A | Discharge status: Home / Self Care | Payer: Medicare HMO | Attending: Emergency Medicine | Admitting: Emergency Medicine

## 2014-04-17 ENCOUNTER — Encounter (HOSPITAL_COMMUNITY): Payer: Self-pay | Admitting: *Deleted

## 2014-04-17 DIAGNOSIS — M810 Age-related osteoporosis without current pathological fracture: Secondary | ICD-10-CM | POA: Insufficient documentation

## 2014-04-17 DIAGNOSIS — K589 Irritable bowel syndrome without diarrhea: Secondary | ICD-10-CM | POA: Diagnosis not present

## 2014-04-17 DIAGNOSIS — N183 Chronic kidney disease, stage 3 (moderate): Secondary | ICD-10-CM | POA: Diagnosis not present

## 2014-04-17 DIAGNOSIS — Z791 Long term (current) use of non-steroidal anti-inflammatories (NSAID): Secondary | ICD-10-CM | POA: Insufficient documentation

## 2014-04-17 DIAGNOSIS — J45909 Unspecified asthma, uncomplicated: Secondary | ICD-10-CM | POA: Insufficient documentation

## 2014-04-17 DIAGNOSIS — Z79899 Other long term (current) drug therapy: Secondary | ICD-10-CM | POA: Diagnosis not present

## 2014-04-17 DIAGNOSIS — Z8673 Personal history of transient ischemic attack (TIA), and cerebral infarction without residual deficits: Secondary | ICD-10-CM | POA: Insufficient documentation

## 2014-04-17 DIAGNOSIS — Z87440 Personal history of urinary (tract) infections: Secondary | ICD-10-CM | POA: Insufficient documentation

## 2014-04-17 DIAGNOSIS — Z86010 Personal history of colonic polyps: Secondary | ICD-10-CM | POA: Insufficient documentation

## 2014-04-17 DIAGNOSIS — E039 Hypothyroidism, unspecified: Secondary | ICD-10-CM | POA: Insufficient documentation

## 2014-04-17 DIAGNOSIS — G43909 Migraine, unspecified, not intractable, without status migrainosus: Secondary | ICD-10-CM | POA: Diagnosis not present

## 2014-04-17 DIAGNOSIS — L02214 Cutaneous abscess of groin: Secondary | ICD-10-CM | POA: Insufficient documentation

## 2014-04-17 DIAGNOSIS — M1990 Unspecified osteoarthritis, unspecified site: Secondary | ICD-10-CM | POA: Insufficient documentation

## 2014-04-17 DIAGNOSIS — Z792 Long term (current) use of antibiotics: Secondary | ICD-10-CM | POA: Insufficient documentation

## 2014-04-17 DIAGNOSIS — Z87891 Personal history of nicotine dependence: Secondary | ICD-10-CM | POA: Insufficient documentation

## 2014-04-17 DIAGNOSIS — F329 Major depressive disorder, single episode, unspecified: Secondary | ICD-10-CM | POA: Insufficient documentation

## 2014-04-17 DIAGNOSIS — R1030 Lower abdominal pain, unspecified: Secondary | ICD-10-CM | POA: Diagnosis present

## 2014-04-17 DIAGNOSIS — E1140 Type 2 diabetes mellitus with diabetic neuropathy, unspecified: Secondary | ICD-10-CM | POA: Insufficient documentation

## 2014-04-17 DIAGNOSIS — I129 Hypertensive chronic kidney disease with stage 1 through stage 4 chronic kidney disease, or unspecified chronic kidney disease: Secondary | ICD-10-CM | POA: Diagnosis not present

## 2014-04-17 DIAGNOSIS — K219 Gastro-esophageal reflux disease without esophagitis: Secondary | ICD-10-CM | POA: Diagnosis not present

## 2014-04-17 LAB — CBG MONITORING, ED: GLUCOSE-CAPILLARY: 242 mg/dL — AB (ref 70–99)

## 2014-04-17 MED ORDER — DOXYCYCLINE HYCLATE 100 MG PO CAPS: 100.0000 mg | ORAL_CAPSULE | Freq: Two times a day (BID) | ORAL | Status: DC

## 2014-04-17 MED ORDER — ONDANSETRON 4 MG PO TBDP
4.0000 mg | ORAL_TABLET | Freq: Once | ORAL | Status: AC
  Administered 2014-04-17: 4 mg via ORAL
  Filled 2014-04-17: qty 1

## 2014-04-17 NOTE — ED Provider Notes (Signed)
CSN: [id number]     Arrival date & time 04/17/14  1733 History  This chart was scribed for Vanetta Mulders, MD by Bronson Curb, ED Scribe. This patient was seen in room APA03/APA03 and the patient's care was started at 8:31 PM.    Chief Complaint  Patient presents with  . Emesis    Patient is a 74 y.o. female presenting with leg pain. The history is provided by the patient. No language interpreter was used.  Leg Pain Location:  Leg Time since incident:  2 days Injury: no   Leg location:  L upper leg and R upper leg Pain details:    Radiates to:  Does not radiate   Severity:  Moderate   Onset quality:  Sudden   Duration:  2 days   Timing:  Constant   Progression:  Unchanged Chronicity:  New Dislocation: no   Foreign body present:  No foreign bodies Prior injury to area:  No Relieved by:  Nothing Worsened by:  Nothing tried Associated symptoms: fever   Associated symptoms: no back pain      HPI Comments: [name] is a 74 y.o. female who presents to the Emergency Department complaining of pain to the inner thighs and groin area for the past 2 days Patient was seen here 2 days ago where she had 5 painful abscesses to the inner thighs and groin area I&D'd. She states she was prescribed Vicodin and Bactrim, and notes compliance with all medications, however, she states the pain still persists. There is associated subjective fever (traige temp 98.5 F). She also reports 1 episode of vomiting. She denies any nausea at this time. She further denies cough, rhinorrhea, sore throat, SOB, chest pain, abdominal pain dysuria, HA, or back pain.   Past Medical History  Diagnosis Date  . Asthma   . Depression   . Type 2 diabetes mellitus with diabetic neuropathy   . GERD (gastroesophageal reflux disease)   . Essential hypertension, benign   . Low back pain   . IBS (irritable bowel syndrome)   . History of recurrent UTIs   . Cataract   . Glaucoma   . Arthritis   . Hx of  migraines   . Carpal tunnel syndrome   . Morbid obesity   . Sleep apnea     Intolerant to CPAP   . Hx of colonic polyps   . Anxiety   . Carotid artery aneurysm     Right s/p endovascular treatment 2004  . Neurogenic bladder   . Stroke   . Osteoporosis   . Hypothyroidism   . CKD (chronic kidney disease) stage 3, GFR 30-59 ml/min    Past Surgical History  Procedure Laterality Date  . Vesicovaginal fistula closure w/ tah    . Carpal tunnel release - bilateral  1992  . Total hip replacement - right  2002  . Stomach surgery gastropexy for gastric volvulus  2009  . Tubular adenoma    . Shoulder surgery      Rotator cuff repair, 2012  . Spinal cord stimulator insertion    . Colonoscopy  06/14/2006    SLF:two 3 mm cecal polyps/pancolonic diverticulosis/sigmoid colon lipoma, tubular adenoma.   . Esophagogastroduodenoscopy  08/12/2007    NWG:NFAOZH esophagus without evidence of Barrett's,mass, erosion ulceration or stricture.  The GE junction was at 40 cm.  The Bravo  capsule was placed at 34 cm/ Unable to appreciate a hiatal hernia in the retroflexed viewNormal duodenal bulb and second portion  of the duodenum. mild gastritis.   . Colonoscopy N/A 03/04/2013    Procedure: COLONOSCOPY;  Surgeon: West Bali, MD;  Location: [facility];  Service: Endoscopy;  Laterality: N/A;  11:30-moved to 12:35 Soledad Gerlach to notify pt  . Cerebral aneurysm repair  2004    Coil   . Abdominal hysterectomy     Family History  Problem Relation Age of Onset  . Heart disease Mother   . Cancer Mother   . Diabetes Brother   . Cancer Brother     Prostate   . Cancer Brother     Pancreatic   . Cancer Sister     Breast   . Heart attack Sister   . Lung disease    . Asthma    . Colon cancer Neg Hx    History  Substance Use Topics  . Smoking status: Former Smoker    Types: Cigarettes  . Smokeless tobacco: Never Used  . Alcohol Use: No   OB History    Gravida Para Term Preterm AB TAB SAB Ectopic  Multiple Living            6     Review of Systems  Constitutional: Positive for fever.  HENT: Negative for rhinorrhea and sore throat.   Eyes: Negative for visual disturbance.  Respiratory: Negative for cough and shortness of breath.   Cardiovascular: Positive for leg swelling. Negative for chest pain.  Gastrointestinal: Positive for nausea (resolved) and vomiting. Negative for abdominal pain and diarrhea.  Genitourinary: Negative for dysuria.  Musculoskeletal: Negative for back pain.  Skin: Positive for rash and wound.  Neurological: Negative for headaches.  Hematological: Does not bruise/bleed easily.  Psychiatric/Behavioral: Negative for confusion.      Allergies  Review of patient's allergies indicates no known allergies.  Home Medications   Prior to Admission medications   Medication Sig Start Date End Date Taking? Authorizing Provider  Acetaminophen-Codeine 300-30 MG per tablet Take 1 tablet by mouth every 6 (six) hours as needed for pain. Patient not taking: Reported on 04/15/2014 03/16/14   Vickki Hearing, MD  alendronate (FOSAMAX) 70 MG tablet Take 70 mg by mouth every Wednesday. Take with a full glass of water on an empty stomach.    Historical Provider, MD  amLODipine (NORVASC) 10 MG tablet Take 10 mg by mouth daily. 02/05/14   Historical Provider, MD  azithromycin (ZITHROMAX Z-PAK) 250 MG tablet 2 po day one, then 1 daily x 4 days Patient not taking: Reported on 03/03/2014 11/07/13   Flint Melter, MD  celecoxib (CELEBREX) 100 MG capsule Take 1 capsule (100 mg total) by mouth 2 (two) times daily. 03/03/14   Benny Lennert, MD  dicyclomine (BENTYL) 10 MG capsule 1 po qac and hs AS NEEDED FOR ABDOMINAL CRAMPS AND LOOSE STOOLS Patient taking differently: Take 10 mg by mouth 4 (four) times daily -  before meals and at bedtime. ABDOMINAL CRAMPS AND LOOSE STOOLS 11/20/13   West Bali, MD  doxepin (SINEQUAN) 25 MG capsule Take 100 mg by mouth at bedtime.  06/30/10    Historical Provider, MD  doxycycline (VIBRAMYCIN) 100 MG capsule Take 1 capsule (100 mg total) by mouth 2 (two) times daily. 04/17/14   Vanetta Mulders, MD  furosemide (LASIX) 20 MG tablet Take 20 mg by mouth daily as needed for fluid.     Historical Provider, MD  gabapentin (NEURONTIN) 100 MG capsule Take 100 mg by mouth 2 (two) times daily as needed (pain).  Historical Provider, MD  glimepiride (AMARYL) 4 MG tablet Take 4 mg by mouth daily.  06/30/10   Historical Provider, MD  HYDROcodone-acetaminophen (NORCO/VICODIN) 5-325 MG per tablet Take 1 tablet by mouth every 6 (six) hours as needed. 04/15/14   Kristen N Ward, DO  levothyroxine (SYNTHROID, LEVOTHROID) 25 MCG tablet Take 25 mcg by mouth daily.    Historical Provider, MD  losartan (COZAAR) 25 MG tablet Take 25 mg by mouth daily.  04/29/13   Historical Provider, MD  meclizine (ANTIVERT) 25 MG tablet Take 25 mg by mouth 3 (three) times daily as needed (vertigo).  10/26/10   Historical Provider, MD  meloxicam (MOBIC) 7.5 MG tablet Take 1 tablet (7.5 mg total) by mouth daily. 08/12/13   Vickki HearingStanley E Beretta, MD  metFORMIN (GLUCOPHAGE) 500 MG tablet Take 500 mg by mouth 2 (two) times daily with a meal.      Historical Provider, MD  omeprazole (PRILOSEC) 20 MG capsule Take 20 mg by mouth 2 (two) times daily.     Historical Provider, MD  Probiotic Product (PROBIOTIC DAILY PO) Take 1 tablet by mouth daily.    Historical Provider, MD  promethazine (PHENERGAN) 25 MG tablet Take 25 mg by mouth 2 (two) times daily as needed for nausea.  07/24/10   Historical Provider, MD  sulfamethoxazole-trimethoprim (BACTRIM DS,SEPTRA DS) 800-160 MG per tablet Take 1 tablet by mouth 2 (two) times daily. 04/15/14 04/22/14  Layla MawKristen N Ward, DO  Vitamin D, Ergocalciferol, (DRISDOL) 50000 UNITS CAPS capsule Take 50,000 Units by mouth every Wednesday.    Historical Provider, MD   Triage Vitals: BP 112/48 mmHg  Pulse 85  Temp(Src) 98.5 F (36.9 C) (Oral)  Resp 20  Ht 5' (1.524 m)   Wt 230 lb (104.327 kg)  BMI 44.92 kg/m2  SpO2 99%  Physical Exam  Constitutional: She is oriented to person, place, and time. She appears well-developed and well-nourished. No distress.  HENT:  Head: Normocephalic and atraumatic.  Eyes: Conjunctivae and EOM are normal.  Neck: Neck supple. No tracheal deviation present.  Cardiovascular: Normal rate, regular rhythm and normal heart sounds.   No murmur heard. Pulmonary/Chest: Effort normal and breath sounds normal. No respiratory distress.  Abdominal: Soft. Bowel sounds are normal. There is no tenderness.  Genitourinary:  Left upper inner thigh: there are several areas of excoriating skin and open wounds approximately 2-3 cm in size. No significant induration. No deep abscess or fluctuance.  On left lower labia area where small abscess were I&D'd, there is a 1cm area of induration. No fluctuance. Redness throughout vulva and perianal area. No induration.  Musculoskeletal: Normal range of motion.  Neurological: She is alert and oriented to person, place, and time.  Skin: Skin is warm and dry.  Psychiatric: She has a normal mood and affect. Her behavior is normal.  Nursing note and vitals reviewed.   ED Course  Procedures (including critical care time)  DIAGNOSTIC STUDIES: Oxygen Saturation is 99% on room air, normal by my interpretation.    COORDINATION OF CARE: At 2041 Discussed treatment plan with patient. Patient agrees.   Labs Review Labs Reviewed  CBG MONITORING, ED - Abnormal; Notable for the following:    Glucose-Capillary 242 (*)    All other components within normal limits   Results for orders placed or performed during the hospital encounter of 04/17/14  POC CBG, ED  Result Value Ref Range   Glucose-Capillary 242 (H) 70 - 99 mg/dL   Comment 1 Documented in Chart  Comment 2 Notify RN     Imaging Review No results found.   EKG Interpretation None      MDM   Final diagnoses:  Groin abscess   Patient  seen here on January 13 had some I&D of some bilateral groin abscesses. Patient started on Septra. Patient returns tonight stating that things aren't any better. On close examination wound seemed to be healing no evidence of any fluctuance or need for additional incision and drainage or recurrent incision and drainage of any abscesses. Will change patient over to doxycycline and have her follow-up with her surgeon Dr. Lovell Sheehan. Patient's blood sugar elevated patient has a history of diabetes.  Patient given precautions and reasons to return.  I personally performed the services described in this documentation, which was scribed in my presence. The recorded information has been reviewed and is accurate.     Vanetta Mulders, MD 04/17/14 2127

## 2014-04-17 NOTE — ED Notes (Signed)
Vomiting began today.  Was having pain bilateral inner thighs where boils were lanced 2 days ago.l

## 2014-04-17 NOTE — Discharge Instructions (Signed)
As we discussed no evidence of need for new IND of abscesses tonight.: Stop the Septra and change over to doxycycline. Follow-up with your surgeon Dr. Lovell SheehanJenkins. Continue the soaking in warm water for 20 minutes twice a day. Return for any new or worse symptoms. Continue your current pain medicine.

## 2014-05-31 ENCOUNTER — Emergency Department (HOSPITAL_COMMUNITY)
Admission: EM | Admit: 2014-05-31 | Discharge: 2014-06-01 | Discharge status: Against Medical Advice | Payer: Medicare HMO | Attending: Emergency Medicine | Admitting: Emergency Medicine

## 2014-05-31 ENCOUNTER — Encounter (HOSPITAL_COMMUNITY): Payer: Self-pay | Admitting: Emergency Medicine

## 2014-05-31 DIAGNOSIS — Z86010 Personal history of colonic polyps: Secondary | ICD-10-CM | POA: Insufficient documentation

## 2014-05-31 DIAGNOSIS — H269 Unspecified cataract: Secondary | ICD-10-CM | POA: Insufficient documentation

## 2014-05-31 DIAGNOSIS — E039 Hypothyroidism, unspecified: Secondary | ICD-10-CM | POA: Insufficient documentation

## 2014-05-31 DIAGNOSIS — Z8673 Personal history of transient ischemic attack (TIA), and cerebral infarction without residual deficits: Secondary | ICD-10-CM | POA: Insufficient documentation

## 2014-05-31 DIAGNOSIS — G43909 Migraine, unspecified, not intractable, without status migrainosus: Secondary | ICD-10-CM | POA: Insufficient documentation

## 2014-05-31 DIAGNOSIS — Z791 Long term (current) use of non-steroidal anti-inflammatories (NSAID): Secondary | ICD-10-CM | POA: Insufficient documentation

## 2014-05-31 DIAGNOSIS — K219 Gastro-esophageal reflux disease without esophagitis: Secondary | ICD-10-CM | POA: Insufficient documentation

## 2014-05-31 DIAGNOSIS — N183 Chronic kidney disease, stage 3 (moderate): Secondary | ICD-10-CM | POA: Insufficient documentation

## 2014-05-31 DIAGNOSIS — J45901 Unspecified asthma with (acute) exacerbation: Secondary | ICD-10-CM | POA: Diagnosis not present

## 2014-05-31 DIAGNOSIS — Z87891 Personal history of nicotine dependence: Secondary | ICD-10-CM | POA: Diagnosis not present

## 2014-05-31 DIAGNOSIS — F419 Anxiety disorder, unspecified: Secondary | ICD-10-CM | POA: Insufficient documentation

## 2014-05-31 DIAGNOSIS — G4730 Sleep apnea, unspecified: Secondary | ICD-10-CM | POA: Insufficient documentation

## 2014-05-31 DIAGNOSIS — Z79899 Other long term (current) drug therapy: Secondary | ICD-10-CM | POA: Insufficient documentation

## 2014-05-31 DIAGNOSIS — M1990 Unspecified osteoarthritis, unspecified site: Secondary | ICD-10-CM | POA: Insufficient documentation

## 2014-05-31 DIAGNOSIS — I129 Hypertensive chronic kidney disease with stage 1 through stage 4 chronic kidney disease, or unspecified chronic kidney disease: Secondary | ICD-10-CM | POA: Diagnosis not present

## 2014-05-31 DIAGNOSIS — F329 Major depressive disorder, single episode, unspecified: Secondary | ICD-10-CM | POA: Diagnosis not present

## 2014-05-31 DIAGNOSIS — H409 Unspecified glaucoma: Secondary | ICD-10-CM | POA: Insufficient documentation

## 2014-05-31 DIAGNOSIS — Z792 Long term (current) use of antibiotics: Secondary | ICD-10-CM | POA: Insufficient documentation

## 2014-05-31 DIAGNOSIS — E1140 Type 2 diabetes mellitus with diabetic neuropathy, unspecified: Secondary | ICD-10-CM | POA: Insufficient documentation

## 2014-05-31 DIAGNOSIS — R05 Cough: Secondary | ICD-10-CM | POA: Diagnosis present

## 2014-05-31 DIAGNOSIS — Z87440 Personal history of urinary (tract) infections: Secondary | ICD-10-CM | POA: Diagnosis not present

## 2014-05-31 NOTE — ED Notes (Signed)
States cough started last night and she is "highly susceptible to bronchitis and PNA". So she wanted to be seen. Has taken robitussin DM and cough drops with no relief.

## 2014-06-01 ENCOUNTER — Emergency Department (HOSPITAL_COMMUNITY): Payer: Medicare HMO

## 2014-06-01 MED ORDER — IPRATROPIUM-ALBUTEROL 0.5-2.5 (3) MG/3ML IN SOLN
3.0000 mL | Freq: Once | RESPIRATORY_TRACT | Status: AC
  Administered 2014-06-01: 3 mL via RESPIRATORY_TRACT
  Filled 2014-06-01: qty 3

## 2014-06-01 MED ORDER — ALBUTEROL SULFATE (2.5 MG/3ML) 0.083% IN NEBU: 5.0000 mg | INHALATION_SOLUTION | Freq: Once | RESPIRATORY_TRACT | Status: DC

## 2014-06-01 MED ORDER — ALBUTEROL SULFATE (2.5 MG/3ML) 0.083% IN NEBU
2.5000 mg | INHALATION_SOLUTION | Freq: Once | RESPIRATORY_TRACT | Status: AC
  Administered 2014-06-01: 2.5 mg via RESPIRATORY_TRACT
  Filled 2014-06-01: qty 3

## 2014-06-01 MED ORDER — IPRATROPIUM BROMIDE 0.02 % IN SOLN: 0.5000 mg | Freq: Once | RESPIRATORY_TRACT | Status: DC

## 2014-06-01 NOTE — ED Provider Notes (Signed)
CSN: [id number]     Arrival date & time 05/31/14  2312 History  This chart was scribed for Ward Givens, MD by Murriel Hopper, ED Scribe. This patient was seen in room APA05/APA05 and the patient's care was started at 12:35 AM.    Chief Complaint  Patient presents with  . Cough      The history is provided by the patient. No language interpreter was used.     HPI Comments: [name] is a 74 y.o. female who presents to the Emergency Department complaining of an intermittent, worsening dry cough with associated fever and SOB that has been present since last night. She states it started with a tickle in her throat. Pt states that she was unable to sleep because she was coughing so much, and states that her fever was 101 at its highest. Pt states she has taken robitussin and cough drops with no relief. Pt denies rhinorrhea, wheezing, sneezing, chest pain. She denies nausea, vomiting, diarrhea, or chest pain. She states she had pneumonia last spring. During the review of systems patient denied any wheezing however when I examined her she did have wheezing and then she states she has been having wheezing at home.  PCP Dr [name]   Past Medical History  Diagnosis Date  . Asthma   . Depression   . Type 2 diabetes mellitus with diabetic neuropathy   . GERD (gastroesophageal reflux disease)   . Essential hypertension, benign   . Low back pain   . IBS (irritable bowel syndrome)   . History of recurrent UTIs   . Cataract   . Glaucoma   . Arthritis   . Hx of migraines   . Carpal tunnel syndrome   . Morbid obesity   . Sleep apnea     Intolerant to CPAP   . Hx of colonic polyps   . Anxiety   . Carotid artery aneurysm     Right s/p endovascular treatment 2004  . Neurogenic bladder   . Stroke   . Osteoporosis   . Hypothyroidism   . CKD (chronic kidney disease) stage 3, GFR 30-59 ml/min    Past Surgical History  Procedure Laterality Date  . Vesicovaginal fistula closure w/ tah     . Carpal tunnel release - bilateral  1992  . Total hip replacement - right  2002  . Stomach surgery gastropexy for gastric volvulus  2009  . Tubular adenoma    . Shoulder surgery      Rotator cuff repair, 2012  . Spinal cord stimulator insertion    . Colonoscopy  06/14/2006    SLF:two 3 mm cecal polyps/pancolonic diverticulosis/sigmoid colon lipoma, tubular adenoma.   . Esophagogastroduodenoscopy  08/12/2007    WUJ:WJXBJY esophagus without evidence of Barrett's,mass, erosion ulceration or stricture.  The GE junction was at 40 cm.  The Bravo  capsule was placed at 34 cm/ Unable to appreciate a hiatal hernia in the retroflexed viewNormal duodenal bulb and second portion of the duodenum. mild gastritis.   . Colonoscopy N/A 03/04/2013    Procedure: COLONOSCOPY;  Surgeon: West Bali, MD;  Location: [facility];  Service: Endoscopy;  Laterality: N/A;  11:30-moved to 12:35 Soledad Gerlach to notify pt  . Cerebral aneurysm repair  2004    Coil   . Abdominal hysterectomy     Family History  Problem Relation Age of Onset  . Heart disease Mother   . Cancer Mother   . Diabetes Brother   .  Cancer Brother     Prostate   . Cancer Brother     Pancreatic   . Cancer Sister     Breast   . Heart attack Sister   . Lung disease    . Asthma    . Colon cancer Neg Hx    History  Substance Use Topics  . Smoking status: Former Smoker    Types: Cigarettes  . Smokeless tobacco: Never Used  . Alcohol Use: No   Lives at home Lives with spouse  OB History    Gravida Para Term Preterm AB TAB SAB Ectopic Multiple Living            6     Review of Systems  Constitutional: Positive for fever.  HENT: Negative for rhinorrhea and sneezing.   Respiratory: Positive for cough and shortness of breath.   Cardiovascular: Negative for chest pain.  All other systems reviewed and are negative.     Allergies  Review of patient's allergies indicates no known allergies.  Home Medications   Prior to  Admission medications   Medication Sig Start Date End Date Taking? Authorizing Provider  Acetaminophen-Codeine 300-30 MG per tablet Take 1 tablet by mouth every 6 (six) hours as needed for pain. Patient not taking: Reported on 04/15/2014 03/16/14   Vickki Hearing, MD  alendronate (FOSAMAX) 70 MG tablet Take 70 mg by mouth every Wednesday. Take with a full glass of water on an empty stomach.    Historical Provider, MD  amLODipine (NORVASC) 10 MG tablet Take 10 mg by mouth daily. 02/05/14   Historical Provider, MD  azithromycin (ZITHROMAX Z-PAK) 250 MG tablet 2 po day one, then 1 daily x 4 days Patient not taking: Reported on 03/03/2014 11/07/13   Flint Melter, MD  celecoxib (CELEBREX) 100 MG capsule Take 1 capsule (100 mg total) by mouth 2 (two) times daily. 03/03/14   Benny Lennert, MD  dicyclomine (BENTYL) 10 MG capsule 1 po qac and hs AS NEEDED FOR ABDOMINAL CRAMPS AND LOOSE STOOLS Patient taking differently: Take 10 mg by mouth 4 (four) times daily -  before meals and at bedtime. ABDOMINAL CRAMPS AND LOOSE STOOLS 11/20/13   West Bali, MD  doxepin (SINEQUAN) 25 MG capsule Take 100 mg by mouth at bedtime.  06/30/10   Historical Provider, MD  doxycycline (VIBRAMYCIN) 100 MG capsule Take 1 capsule (100 mg total) by mouth 2 (two) times daily. 04/17/14   Vanetta Mulders, MD  furosemide (LASIX) 20 MG tablet Take 20 mg by mouth daily as needed for fluid.     Historical Provider, MD  gabapentin (NEURONTIN) 100 MG capsule Take 100 mg by mouth 2 (two) times daily as needed (pain).    Historical Provider, MD  glimepiride (AMARYL) 4 MG tablet Take 4 mg by mouth daily.  06/30/10   Historical Provider, MD  HYDROcodone-acetaminophen (NORCO/VICODIN) 5-325 MG per tablet Take 1 tablet by mouth every 6 (six) hours as needed. 04/15/14   Kristen N Ward, DO  levothyroxine (SYNTHROID, LEVOTHROID) 25 MCG tablet Take 25 mcg by mouth daily.    Historical Provider, MD  losartan (COZAAR) 25 MG tablet Take 25 mg by mouth  daily.  04/29/13   Historical Provider, MD  meclizine (ANTIVERT) 25 MG tablet Take 25 mg by mouth 3 (three) times daily as needed (vertigo).  10/26/10   Historical Provider, MD  meloxicam (MOBIC) 7.5 MG tablet Take 1 tablet (7.5 mg total) by mouth daily. 08/12/13   Fernande Boyden  Romeo AppleHarrison, MD  metFORMIN (GLUCOPHAGE) 500 MG tablet Take 500 mg by mouth 2 (two) times daily with a meal.      Historical Provider, MD  omeprazole (PRILOSEC) 20 MG capsule Take 20 mg by mouth 2 (two) times daily.     Historical Provider, MD  Probiotic Product (PROBIOTIC DAILY PO) Take 1 tablet by mouth daily.    Historical Provider, MD  promethazine (PHENERGAN) 25 MG tablet Take 25 mg by mouth 2 (two) times daily as needed for nausea.  07/24/10   Historical Provider, MD  Vitamin D, Ergocalciferol, (DRISDOL) 50000 UNITS CAPS capsule Take 50,000 Units by mouth every Wednesday.    Historical Provider, MD   SpO2 100%  Pulse ox normal   Physical Exam  Constitutional: She is oriented to person, place, and time. She appears well-developed and well-nourished.  Non-toxic appearance. She does not appear ill. No distress.  HENT:  Head: Normocephalic and atraumatic.  Right Ear: External ear normal.  Left Ear: External ear normal.  Nose: Nose normal. No mucosal edema or rhinorrhea.  Mouth/Throat: Oropharynx is clear and moist and mucous membranes are normal. No dental abscesses or uvula swelling.  Eyes: Conjunctivae and EOM are normal. Pupils are equal, round, and reactive to light.  Neck: Normal range of motion and full passive range of motion without pain. Neck supple.  Cardiovascular: Normal rate, regular rhythm and normal heart sounds.  Exam reveals no gallop and no friction rub.   No murmur heard. Pulmonary/Chest: Effort normal. No respiratory distress. She has wheezes. She has no rhonchi. She has no rales. She exhibits no tenderness and no crepitus.  Expiratory wheezing, sometimes audible  Abdominal: Soft. Normal appearance and  bowel sounds are normal. She exhibits no distension. There is no tenderness. There is no rebound and no guarding.  Musculoskeletal: Normal range of motion. She exhibits no edema or tenderness.  Moves all extremities well.   Neurological: She is alert and oriented to person, place, and time. She has normal strength. No cranial nerve deficit.  Skin: Skin is warm, dry and intact. No rash noted. No erythema. No pallor.  Psychiatric: She has a normal mood and affect. Her speech is normal and behavior is normal. Her mood appears not anxious.  Nursing note and vitals reviewed.   ED Course  Procedures (including critical care time)  Medications  ipratropium-albuterol (DUONEB) 0.5-2.5 (3) MG/3ML nebulizer solution 3 mL (3 mLs Nebulization Given 06/01/14 0109)  albuterol (PROVENTIL) (2.5 MG/3ML) 0.083% nebulizer solution 2.5 mg (2.5 mg Nebulization Given 06/01/14 0109)  ipratropium-albuterol (DUONEB) 0.5-2.5 (3) MG/3ML nebulizer solution 3 mL (3 mLs Nebulization Given 06/01/14 0331)  albuterol (PROVENTIL) (2.5 MG/3ML) 0.083% nebulizer solution 2.5 mg (2.5 mg Nebulization Given 06/01/14 0331)     DIAGNOSTIC STUDIES: Oxygen Saturation is 100% on RA, normal by my interpretation.    COORDINATION OF CARE: 12:40 AM Discussed treatment plan with pt at bedside and pt agreed to plan. Patient had a albuterol and Atrovent nebulizer ordered.  Patient was rechecked at 3 AM. She has improved air movement and has rare wheezing now. We discussed getting a second nebulizer. Patient is strongly wanting to be placed antibiotics. However she has only had a cough with wheezing for less than 48 hours. I discussed with her this is a viral illness and antibiotics are not needed at this time. Patient also refuses to take steroids because she states she hasn't lost the weight she gained when she was on them a couple years ago. Patient also states  she has a nebulizer and inhaler at home but she has not used them the last couple  days for her wheezing and shortness of breath.   Labs Review Labs Reviewed - No data to display  Imaging Review Dg Chest 2 View  06/01/2014   CLINICAL DATA:  Acute onset of cough and wheezing for 1 week. Sore throat and bilateral chest soreness. Initial encounter.  EXAM: CHEST  2 VIEW  COMPARISON:  Chest radiograph performed 11/07/2013  FINDINGS: The lungs are well-aerated. There is no evidence of pleural effusion or pneumothorax. Elevation of the left hemidiaphragm appears to reflects the stomach bubble. Minimal bibasilar atelectasis is noted.  The heart is mildly enlarged. Thoracic spinal stimulation leads are partially imaged. No acute osseous abnormalities are seen.  IMPRESSION: Minimal bibasilar atelectasis noted; mild cardiomegaly.   Electronically Signed   By: Roanna Raider M.D.   On: 06/01/2014 01:45     EKG Interpretation None      MDM   Final diagnoses:  Asthma flare    Pt left AMA  I personally performed the services described in this documentation, which was scribed in my presence. The recorded information has been reviewed and considered.  Devoria Albe, MD, FACEP      Ward Givens, MD 06/01/14 [phone]

## 2014-06-01 NOTE — ED Notes (Signed)
Pt left AMA, stating she did not get a "shot" or any of the "right medicine" for what she had. She stated the Dr, only "would give a breathing treatment and that's it and said I didn't need no antibiotic". apologized to pt for delays and allowed her to sign out AMA as she wished. Attempted to explain reason for no antibiotcs, but pt stated she "don't care".

## 2014-07-24 NOTE — Op Note (Signed)
PATIENT NAME:  [name], [name] MR#:  [id number] DATE OF BIRTH:  1940-10-25  DATE OF PROCEDURE:  07/16/2012  Location: Operating Room Referring Physician: Dr. Neomia Dear, Fax: [phone] Procedure by: Kathlen Brunswick. Dossie Arbour, M.D.  Note: This is the case of a 74 year old, morbidly obese African American female patient who comes into the same-day surgery today for permanent implant of a bilateral lumbar spinal cord stimulator. The patient has a history significant for chronic low back pain, bilateral lower extremity pain, and a failed back surgery syndrome.   Procedure(s):  1. Permanent implantation of Lumbar Epidural, Double Percutaneous Neurostimulator Leads (16 electrode array). 2. Implantation of Epidural Neurostimulator Generator. 3. Fluoroscopic Needle Guidance 4. Intraoperative Analysis and Programming. 5. Postoperative Analysis and Programming. 6. Moderate Conscious Sedation by the [facility] Anesthesia Team.  Surgeon: Kathlen Brunswick. Dossie Arbour, M.D. Side of implant: Bilateral Top electrode tip level: Upper third of T7 Diagnostic Indications: Chronic Lumbosacral Radiculopathy/Radiculitis. Position: Prone.  Prepping solution: DuraPrep Area prepped: The thoracolumbar and sacral areas, were prepped with a broad-spectrum topical antiseptic microbicide. Target area: Lumbar Epidural Space, around the T7-8 vertebral body, for the electrode tip. Insertion site is the T12-L1 intervertebral space. Level entered: T12-L1 Number of attempts: one  Infection Control: Standard Universal Precautions taken (Respiratory Hygiene/Cough Etiquette; Mouth, nose, eye protection; Hand Hygiene; Personal protective equipment (PPE); safe injection practices; and use of masks and disposable sterile surgical gloves) as recommended by the Department of [place] for Disease Control and Prevention (CDC).  Safety Measures: Allergies were reviewed. Appropriate site, procedure, and patient were  confirmed by following the Joint Commission's Universal Protocol (UP.01.01.01). The patient was asked to confirm marked site and procedure, before commencing. The patient was asked about blood thinners, or active infections, both of which were denied. No attempt was made at seeking any paresthesias. Aspiration looking for blood return was conducted prior to injecting. At no point did we inject any substances, as a needle was being advanced.  Pre-procedure Assessment:  A medical history and physical exam were obtained. Relevant documentation was reviewed and verified. Prior to the procedure, the patient was provided with an Audio CD, as well as written information on the procedure, including side-effects, and possible complications. Under the influence of no sedatives, a verbal, as well as a written informed consent were obtained, after having provided information on the risks and possible complications. To fulfill our ethical and legal obligations, as recommended by the American Medical Association's Code of Ethics, we have provided information to the patient about our clinical impression; the nature and purpose of an available treatment or procedure; the risks and benefits of an available treatment or procedure; alternatives; the risk and benefits of the alternative treatment or procedure; and the risks and benefits of not receiving or undergoing a treatment or procedure. The patient was provided information about the risks and possible complications associated with the procedure. These include, but not limited to, failure to achieve desired goals, infection, bleeding, organ or nerve damage, allergic reactions, paralysis, and death. In addition, the patient was informed that Medicine is not an exact science; therefore, there is also the possibility of unforeseen risks and possible complications that may result in a catastrophic outcome. The patient indicated having understood very clearly.  We have given the  patient no guarantees and we have made no promises. Ample time was given to the patient to ask questions, all of which were answered, to the patient's satisfaction, before proceeding. The patient understands that by signing our informed consent  form, they understand and accept the risks and the fact that it is impossible to predict all possible complications. Baseline vital signs were taken and the medical assessment was completed. Verification of the correct person, correct site (including marking of site), and correct procedure were performed and confirmed by the patient. Baseline vital signs were taken and the initial assessment was completed. Verification of the correct person, correct site (including marking of site), and correct procedure were performed and confirmed by the patient, in the form of a "Time Out".  Monitoring: The patient was monitored in the usual manner, using NIBPM, ECG, and pulse oximetry.  IV Access:  An IV access was obtained and secured.  Analgesia:  Moderate (Conscious) Intravenous sedation: Consent was obtained before administering any sedation. Availability of a responsible, adult driver, and NPO status confirmed. Meaningful verbal contact was maintained, with the patient at all times during the procedure. ASA Sedation Guidelines followed. For specifics on pharmacological type and quantity of sedation, please see nursing chart.  Prophylactic Antibiotics: Cefazolin (1st generation cephalosporin) 1 gm IVPB.  Local Anesthesia: Lidocaine 1%. The skin over the procedure site were infiltrated using a 3 ml Luer-Lok syringe with a 0.5 inch, 25-G needle. Deeper tissues were infiltrated using a 3.0 inch, 22-G spinal needle, under fluoroscopic guidance.  Fluoroscopy: The patient was taken to the operative suite, where the patient was placed in position for the procedure, over the fluoroscopy compatible table. Fluoroscopy was manipulated, using "Tunnel Vision Technique", to obtain the  best possible view of the target area, on the affected side. Parallax error was corrected before commencing the procedure. Gabor Racz's "Direction-Depth-Direction" technique was used to introduce the procedural needle under continuous pulsed fluoroscopic guidance. Once the target was reached, antero-posterior and lateral fluoroscopic views were taken to confirm needle placement in two planes. Fluoroscopy time: Please see the patient's chart for details.  Description of the procedure: The procedure site was prepped using a broad-spectrum topical antiseptic. The area was then draped in the usual and standard manner. "Time-out" was performed as per JC Universal Protocol (UP.01.01.01).   A midline incision was made over the spinous processes, and hemostasis attained. The procedural needle was then introduced through the incision using Gabor Racz's "Direction-Depth-Direction" technique, under pulsed fluoroscopic guidance. No attempt was made at seeking a paresthesia. The paramidline approach was used to enter the posterior lumbar epidural space at a 30 degree angle, using "Loss-of-resistance Technique" with 3 ml of PF-NaCl (0.9% NSS) + 0.5 ml of air, in a 5 ml glass syringe, using a "loss-of-bounce technique", at the desired level. Correct needle placement was confirmed in the  antero-posterior and lateral fluoroscopic views. The epidural lead was gently introduced under real-time fluoroscopy, constantly assessing for pain or paresthesias, until the tip was observed to be at the target level, on the side ipsilateral to the pain. The placement of the second lead was accomplished in a similar fashion. Once the target was thought to have been reached, antero-posterior and lateral fluoroscopic views were taken to confirm electrode placement in two planes. Placement was tested until a comfortable stimulation pattern was observed over the usual painful area. Once the patient had assured [place] that the stimulation was in the  correct pattern, and distribution, we proceeded to remove the 15-G "Tuohy" epidural needle(s). This was done while observing the electrode tip(s) under real-time fluoroscopy to prevent movement. The patient was sedated again, and 1% lidocaine was infiltrated into the buttocks area, in order to create the generator pocket. The extension  was tunneled and connected to the generator. An impedance check was conducted after connecting all sections of the system. Once hemostasis was confirmed, both wounds were closed with Vicryl 2-0 after cleaning them with a solution containing 50:50 hydrogen peroxide and Betadine. Surgical staples were used to close the skin. The wounds were covered with sterile transparent bio-occlusive dressings, to easily assess any evidence of infection in the future.  The patient tolerated the entire procedure well. A repeat set of vitals were taken after the procedure and the patient was kept under observation until discharge criteria was met. The patient was provided with discharge instructions, including a section on how to identify potential problems. Should any problems arise concerning this procedure, the patient was given instructions to immediately contact us, without hesitation. The neurostimulator representative and I, both provided the patient with our Business cards containing our contact telephone numbers, and instructed the patient to contact either one of [place], at any time, should there be any problems or questions. In any case, we plan to contact the patient by telephone for a follow-up status report regarding this interventional procedure.  EBL: 20 ml  Complications: No heme; no paresthesias.  Disposition: Return to clinics in 10-11 days for removal of staples and postoperative evaluation.  Additional Comments/Plan: None.  Equipment used:   Medtronic neurostimulator generator, model [id number], serial [id number]. Medtronic lead kit, model [id number], lot  [id number]. Medtronic lead kit, model [id number], lot [id number].  Disclaimer: Medicine is not an Chief Strategy Officer. The only guarantee in medicine is that nothing is guaranteed. It is important to note that the decision to proceed with this intervention was based on the information collected from the patient. The Data and conclusions were drawn from the patient's questionnaire, the interview, and the physical examination. Because the information was provided in large part by the patient, it cannot be guaranteed that it has not been purposely or unconsciously manipulated. Every effort has been made to obtain as much relevant data as possible for this evaluation. It is important to note that the conclusions that lead to this procedure are derived in large part from the available data. Always take into account that the treatment will also be dependent on availability of resources and existing treatment guidelines, considered by other Pain Management Practitioners as being common knowledge and practice, at this time. For Medico-Legal purposes, it is also important to point out that variations in procedural techniques and pharmacological choices are the acceptable norm. The indications, contraindications, technique, and results of the above procedure should only be interpreted and judged by a Board-Certified Interventional Pain Specialist with extensive familiarity and expertise in the same exact procedure and technique, doing otherwise would be inappropriate and unethical.   ____________________________ Kathlen Brunswick. Dossie Arbour, MD fan:jm D: 07/16/2012 17:31:10 ET T: 07/16/2012 20:34:34 ET JOB#: [id number]  cc: Sandee Bernath A. Dossie Arbour, MD, <Dictator> Dr. Neomia Dear, Fax: [phone]  Gaspar Cola MD ELECTRONICALLY SIGNED 07/19/2012 10:36

## 2014-08-24 ENCOUNTER — Ambulatory Visit (INDEPENDENT_AMBULATORY_CARE_PROVIDER_SITE_OTHER): Payer: Medicare HMO | Admitting: Orthopedic Surgery

## 2014-08-24 VITALS — BP 145/83 | Ht 60.0 in | Wt 227.0 lb

## 2014-08-24 DIAGNOSIS — M1000 Idiopathic gout, unspecified site: Secondary | ICD-10-CM

## 2014-08-24 DIAGNOSIS — M170 Bilateral primary osteoarthritis of knee: Secondary | ICD-10-CM | POA: Diagnosis not present

## 2014-08-24 DIAGNOSIS — M109 Gout, unspecified: Secondary | ICD-10-CM

## 2014-08-24 MED ORDER — NAPROXEN 500 MG PO TABS: 500.0000 mg | ORAL_TABLET | Freq: Two times a day (BID) | ORAL | Status: DC

## 2014-08-24 MED ORDER — COLCHICINE 0.6 MG PO TABS: 0.6000 mg | ORAL_TABLET | Freq: Four times a day (QID) | ORAL | Status: DC

## 2014-08-24 NOTE — Progress Notes (Signed)
Patient ID: [name], female   DOB: 09-May-1940, 74 y.o.   MRN: [id number]  Chief Complaint  Patient presents with  . Knee Pain    bilateral knee pain, last treated 02/23/14     [name] is a 74 y.o. female.   HPI 74 year old female presented for bilateral knee injections but when she got here then she said which really bothering her is pain swelling over the left great toe she has a history of gout she can't walk she doesn't have numbness or tingling but diffuse swelling around the great toe and painful ambulation  System review fever chills non-neurologic dysfunction in that foot normal  She does have bilateral knee pain Review of Systems As noted above  Past Medical History  Diagnosis Date  . Asthma   . Depression   . Type 2 diabetes mellitus with diabetic neuropathy   . GERD (gastroesophageal reflux disease)   . Essential hypertension, benign   . Low back pain   . IBS (irritable bowel syndrome)   . History of recurrent UTIs   . Cataract   . Glaucoma   . Arthritis   . Hx of migraines   . Carpal tunnel syndrome   . Morbid obesity   . Sleep apnea     Intolerant to CPAP   . Hx of colonic polyps   . Anxiety   . Carotid artery aneurysm     Right s/p endovascular treatment 2004  . Neurogenic bladder   . Stroke   . Osteoporosis   . Hypothyroidism   . CKD (chronic kidney disease) stage 3, GFR 30-59 ml/min     Past Surgical History  Procedure Laterality Date  . Vesicovaginal fistula closure w/ tah    . Carpal tunnel release - bilateral  1992  . Total hip replacement - right  2002  . Stomach surgery gastropexy for gastric volvulus  2009  . Tubular adenoma    . Shoulder surgery      Rotator cuff repair, 2012  . Spinal cord stimulator insertion    . Colonoscopy  06/14/2006    SLF:two 3 mm cecal polyps/pancolonic diverticulosis/sigmoid colon lipoma, tubular adenoma.   . Esophagogastroduodenoscopy  08/12/2007    WUJ:WJXBJY esophagus without evidence of  Barrett's,mass, erosion ulceration or stricture.  The GE junction was at 40 cm.  The Bravo  capsule was placed at 34 cm/ Unable to appreciate a hiatal hernia in the retroflexed viewNormal duodenal bulb and second portion of the duodenum. mild gastritis.   . Colonoscopy N/A 03/04/2013    Procedure: COLONOSCOPY;  Surgeon: West Bali, MD;  Location: [facility];  Service: Endoscopy;  Laterality: N/A;  11:30-moved to 12:35 Soledad Gerlach to notify pt  . Cerebral aneurysm repair  2004    Coil   . Abdominal hysterectomy      Family History  Problem Relation Age of Onset  . Heart disease Mother   . Cancer Mother   . Diabetes Brother   . Cancer Brother     Prostate   . Cancer Brother     Pancreatic   . Cancer Sister     Breast   . Heart attack Sister   . Lung disease    . Asthma    . Colon cancer Neg Hx     Social History History  Substance Use Topics  . Smoking status: Former Smoker    Types: Cigarettes  . Smokeless tobacco: Never Used  . Alcohol Use: No  No Known Allergies  Current Outpatient Prescriptions  Medication Sig Dispense Refill  . Acetaminophen-Codeine 300-30 MG per tablet Take 1 tablet by mouth every 6 (six) hours as needed for pain. (Patient not taking: Reported on 04/15/2014) 60 tablet 5  . alendronate (FOSAMAX) 70 MG tablet Take 70 mg by mouth every Wednesday. Take with a full glass of water on an empty stomach.    Marland Kitchen. amLODipine (NORVASC) 10 MG tablet Take 10 mg by mouth daily.    Marland Kitchen. azithromycin (ZITHROMAX Z-PAK) 250 MG tablet 2 po day one, then 1 daily x 4 days (Patient not taking: Reported on 03/03/2014) 5 tablet 0  . celecoxib (CELEBREX) 100 MG capsule Take 1 capsule (100 mg total) by mouth 2 (two) times daily. 60 capsule 0  . colchicine 0.6 MG tablet Take 1 tablet (0.6 mg total) by mouth every 6 (six) hours. 40 tablet 0  . dicyclomine (BENTYL) 10 MG capsule 1 po qac and hs AS NEEDED FOR ABDOMINAL CRAMPS AND LOOSE STOOLS (Patient taking differently: Take 10 mg by  mouth 4 (four) times daily -  before meals and at bedtime. ABDOMINAL CRAMPS AND LOOSE STOOLS) 60 capsule 5  . doxepin (SINEQUAN) 25 MG capsule Take 100 mg by mouth at bedtime.     Marland Kitchen. doxycycline (VIBRAMYCIN) 100 MG capsule Take 1 capsule (100 mg total) by mouth 2 (two) times daily. 14 capsule 0  . furosemide (LASIX) 20 MG tablet Take 20 mg by mouth daily as needed for fluid.     Marland Kitchen. gabapentin (NEURONTIN) 100 MG capsule Take 100 mg by mouth 2 (two) times daily as needed (pain).    Marland Kitchen. glimepiride (AMARYL) 4 MG tablet Take 4 mg by mouth daily.     Marland Kitchen. HYDROcodone-acetaminophen (NORCO/VICODIN) 5-325 MG per tablet Take 1 tablet by mouth every 6 (six) hours as needed. 15 tablet 0  . levothyroxine (SYNTHROID, LEVOTHROID) 25 MCG tablet Take 25 mcg by mouth daily.    Marland Kitchen. losartan (COZAAR) 25 MG tablet Take 25 mg by mouth daily.     . meclizine (ANTIVERT) 25 MG tablet Take 25 mg by mouth 3 (three) times daily as needed (vertigo).     . meloxicam (MOBIC) 7.5 MG tablet Take 1 tablet (7.5 mg total) by mouth daily. 60 tablet 5  . metFORMIN (GLUCOPHAGE) 500 MG tablet Take 500 mg by mouth 2 (two) times daily with a meal.      . naproxen (NAPROSYN) 500 MG tablet Take 1 tablet (500 mg total) by mouth 2 (two) times daily with a meal. 60 tablet 0  . omeprazole (PRILOSEC) 20 MG capsule Take 20 mg by mouth 2 (two) times daily.     . Probiotic Product (PROBIOTIC DAILY PO) Take 1 tablet by mouth daily.    . promethazine (PHENERGAN) 25 MG tablet Take 25 mg by mouth 2 (two) times daily as needed for nausea.     . Vitamin D, Ergocalciferol, (DRISDOL) 50000 UNITS CAPS capsule Take 50,000 Units by mouth every Wednesday.     No current facility-administered medications for this visit.       Physical Exam Blood pressure 145/83, height 5' (1.524 m), weight 227 lb (102.967 kg). Physical Exam The patient is well developed well nourished and well groomed. Orientation to person place and time is normal  Mood is  pleasant. Ambulatory status she is limping favoring her right foot Right foot evaluation she has swelling over the great toe tenderness to touch and what appears to be gout tophi which  are visible. There is no break in the skin she has normal sensation good pulse no atrophy for ankle are stable at the TMT and ankle joints she has painful range of motion of the great toe and swelling as stated is mild warmth no erythema  Data Reviewed X-rays from 2014 patient has mild arthritic signs in both knees with mild joint space narrowing  Assessment Osteoarthritis both knees Gout right great toe Plan Inject both knees treat for gout  Procedure note left knee injection verbal consent was obtained to inject left knee joint  Timeout was completed to confirm the site of injection  The medications used were 40 mg of Depo-Medrol and 1% lidocaine 3 cc  Anesthesia was provided by ethyl chloride and the skin was prepped with alcohol.  After cleaning the skin with alcohol a 20-gauge needle was used to inject the left knee joint. There were no complications. A sterile bandage was applied.   Procedure note right knee injection verbal consent was obtained to inject right knee joint  Timeout was completed to confirm the site of injection  The medications used were 40 mg of Depo-Medrol and 1% lidocaine 3 cc  Anesthesia was provided by ethyl chloride and the skin was prepped with alcohol.  After cleaning the skin with alcohol a 20-gauge needle was used to inject the right knee joint. There were no complications. A sterile bandage was applied.

## 2014-11-04 ENCOUNTER — Emergency Department (HOSPITAL_COMMUNITY)
Admission: EM | Admit: 2014-11-04 | Discharge: 2014-11-04 | Disposition: A | Discharge status: Home / Self Care | Payer: Medicare HMO | Attending: Emergency Medicine | Admitting: Emergency Medicine

## 2014-11-04 ENCOUNTER — Encounter (HOSPITAL_COMMUNITY): Payer: Self-pay

## 2014-11-04 DIAGNOSIS — Z8673 Personal history of transient ischemic attack (TIA), and cerebral infarction without residual deficits: Secondary | ICD-10-CM | POA: Diagnosis not present

## 2014-11-04 DIAGNOSIS — E1140 Type 2 diabetes mellitus with diabetic neuropathy, unspecified: Secondary | ICD-10-CM | POA: Diagnosis not present

## 2014-11-04 DIAGNOSIS — R112 Nausea with vomiting, unspecified: Secondary | ICD-10-CM | POA: Diagnosis present

## 2014-11-04 DIAGNOSIS — K589 Irritable bowel syndrome without diarrhea: Secondary | ICD-10-CM | POA: Insufficient documentation

## 2014-11-04 DIAGNOSIS — E039 Hypothyroidism, unspecified: Secondary | ICD-10-CM | POA: Diagnosis not present

## 2014-11-04 DIAGNOSIS — M810 Age-related osteoporosis without current pathological fracture: Secondary | ICD-10-CM | POA: Insufficient documentation

## 2014-11-04 DIAGNOSIS — F329 Major depressive disorder, single episode, unspecified: Secondary | ICD-10-CM | POA: Insufficient documentation

## 2014-11-04 DIAGNOSIS — M1990 Unspecified osteoarthritis, unspecified site: Secondary | ICD-10-CM | POA: Insufficient documentation

## 2014-11-04 DIAGNOSIS — Z791 Long term (current) use of non-steroidal anti-inflammatories (NSAID): Secondary | ICD-10-CM | POA: Insufficient documentation

## 2014-11-04 DIAGNOSIS — B9689 Other specified bacterial agents as the cause of diseases classified elsewhere: Secondary | ICD-10-CM | POA: Insufficient documentation

## 2014-11-04 DIAGNOSIS — I129 Hypertensive chronic kidney disease with stage 1 through stage 4 chronic kidney disease, or unspecified chronic kidney disease: Secondary | ICD-10-CM | POA: Insufficient documentation

## 2014-11-04 DIAGNOSIS — N390 Urinary tract infection, site not specified: Secondary | ICD-10-CM | POA: Insufficient documentation

## 2014-11-04 DIAGNOSIS — Z87891 Personal history of nicotine dependence: Secondary | ICD-10-CM | POA: Diagnosis not present

## 2014-11-04 DIAGNOSIS — N183 Chronic kidney disease, stage 3 (moderate): Secondary | ICD-10-CM | POA: Insufficient documentation

## 2014-11-04 DIAGNOSIS — K219 Gastro-esophageal reflux disease without esophagitis: Secondary | ICD-10-CM | POA: Diagnosis not present

## 2014-11-04 DIAGNOSIS — Z79899 Other long term (current) drug therapy: Secondary | ICD-10-CM | POA: Insufficient documentation

## 2014-11-04 DIAGNOSIS — R2243 Localized swelling, mass and lump, lower limb, bilateral: Secondary | ICD-10-CM | POA: Insufficient documentation

## 2014-11-04 DIAGNOSIS — G43909 Migraine, unspecified, not intractable, without status migrainosus: Secondary | ICD-10-CM | POA: Diagnosis not present

## 2014-11-04 DIAGNOSIS — Z86010 Personal history of colonic polyps: Secondary | ICD-10-CM | POA: Insufficient documentation

## 2014-11-04 DIAGNOSIS — A499 Bacterial infection, unspecified: Secondary | ICD-10-CM

## 2014-11-04 DIAGNOSIS — J45909 Unspecified asthma, uncomplicated: Secondary | ICD-10-CM | POA: Diagnosis not present

## 2014-11-04 LAB — URINALYSIS, ROUTINE W REFLEX MICROSCOPIC
Bilirubin Urine: NEGATIVE
GLUCOSE, UA: NEGATIVE mg/dL
Ketones, ur: NEGATIVE mg/dL
Nitrite: NEGATIVE
PROTEIN: NEGATIVE mg/dL
Specific Gravity, Urine: 1.01 (ref 1.005–1.030)
Urobilinogen, UA: 0.2 mg/dL (ref 0.0–1.0)
pH: 6 (ref 5.0–8.0)

## 2014-11-04 LAB — COMPREHENSIVE METABOLIC PANEL
ALK PHOS: 92 U/L (ref 38–126)
ALT: 26 U/L (ref 14–54)
AST: 36 U/L (ref 15–41)
Albumin: 4.1 g/dL (ref 3.5–5.0)
Anion gap: 9 (ref 5–15)
BILIRUBIN TOTAL: 0.3 mg/dL (ref 0.3–1.2)
BUN: 24 mg/dL — AB (ref 6–20)
CHLORIDE: 106 mmol/L (ref 101–111)
CO2: 23 mmol/L (ref 22–32)
Calcium: 8.8 mg/dL — ABNORMAL LOW (ref 8.9–10.3)
Creatinine, Ser: 1.53 mg/dL — ABNORMAL HIGH (ref 0.44–1.00)
GFR calc Af Amer: 37 mL/min — ABNORMAL LOW (ref >60)
GFR, EST NON AFRICAN AMERICAN: 32 mL/min — AB (ref >60)
GLUCOSE: 122 mg/dL — AB (ref 65–99)
Potassium: 4.6 mmol/L (ref 3.5–5.1)
SODIUM: 138 mmol/L (ref 135–145)
Total Protein: 7.6 g/dL (ref 6.5–8.1)

## 2014-11-04 LAB — CBC WITH DIFFERENTIAL/PLATELET
Basophils Absolute: 0 10*3/uL (ref 0.0–0.1)
Basophils Relative: 0 % (ref 0–1)
Eosinophils Absolute: 0.2 10*3/uL (ref 0.0–0.7)
Eosinophils Relative: 2 % (ref 0–5)
HCT: 33.5 % — ABNORMAL LOW (ref 36.0–46.0)
Hemoglobin: 10.7 g/dL — ABNORMAL LOW (ref 12.0–15.0)
Lymphocytes Relative: 50 % — ABNORMAL HIGH (ref 12–46)
Lymphs Abs: 3.6 10*3/uL (ref 0.7–4.0)
MCH: 28.1 pg (ref 26.0–34.0)
MCHC: 31.9 g/dL (ref 30.0–36.0)
MCV: 87.9 fL (ref 78.0–100.0)
Monocytes Absolute: 0.4 10*3/uL (ref 0.1–1.0)
Monocytes Relative: 6 % (ref 3–12)
NEUTROS ABS: 3.1 10*3/uL (ref 1.7–7.7)
NEUTROS PCT: 42 % — AB (ref 43–77)
Platelets: 219 10*3/uL (ref 150–400)
RBC: 3.81 MIL/uL — ABNORMAL LOW (ref 3.87–5.11)
RDW: 15.5 % (ref 11.5–15.5)
WBC: 7.3 10*3/uL (ref 4.0–10.5)

## 2014-11-04 LAB — I-STAT CG4 LACTIC ACID, ED: LACTIC ACID, VENOUS: 1.17 mmol/L (ref 0.5–2.0)

## 2014-11-04 LAB — URINE MICROSCOPIC-ADD ON

## 2014-11-04 LAB — LIPASE, BLOOD: Lipase: 19 U/L — ABNORMAL LOW (ref 22–51)

## 2014-11-04 MED ORDER — ONDANSETRON 4 MG PO TBDP
4.0000 mg | ORAL_TABLET | Freq: Once | ORAL | Status: AC
  Administered 2014-11-04: 4 mg via ORAL
  Filled 2014-11-04: qty 1

## 2014-11-04 MED ORDER — CEPHALEXIN 500 MG PO CAPS: 500.0000 mg | ORAL_CAPSULE | Freq: Two times a day (BID) | ORAL | Status: DC

## 2014-11-04 MED ORDER — LIDOCAINE HCL (PF) 1 % IJ SOLN
INTRAMUSCULAR | Status: AC
  Administered 2014-11-04: 5 mL
  Filled 2014-11-04: qty 5

## 2014-11-04 MED ORDER — ONDANSETRON 4 MG PO TBDP: 4.0000 mg | ORAL_TABLET | Freq: Once | ORAL | Status: DC

## 2014-11-04 MED ORDER — SODIUM CHLORIDE 0.9 % IV BOLUS (SEPSIS): 1000.0000 mL | Freq: Once | INTRAVENOUS | Status: DC

## 2014-11-04 MED ORDER — ONDANSETRON HCL 4 MG/2ML IJ SOLN
4.0000 mg | Freq: Once | INTRAMUSCULAR | Status: DC
  Filled 2014-11-04: qty 2

## 2014-11-04 MED ORDER — CEFTRIAXONE SODIUM 1 G IJ SOLR
1.0000 g | Freq: Once | INTRAMUSCULAR | Status: AC
  Administered 2014-11-04: 1 g via INTRAMUSCULAR
  Filled 2014-11-04: qty 10

## 2014-11-04 NOTE — ED Notes (Signed)
Pt tolerating PO fluids

## 2014-11-04 NOTE — ED Notes (Signed)
Attempted IV access x2 without success. EDP notified.

## 2014-11-04 NOTE — ED Notes (Signed)
I have been vomiting since yesterday off and on, now I am just feeling sick on my stomach with gagging per pt. I called my family and GI doctors and they could not see me today.

## 2014-11-04 NOTE — ED Provider Notes (Signed)
CSN: [id number]     Arrival date & time 11/04/14  1633 History   First MD Initiated Contact with Patient 11/04/14 1936     Chief Complaint  Patient presents with  . Emesis     (Consider location/radiation/quality/duration/timing/severity/associated sxs/prior Treatment) HPI Comments: 74yo F w/ extensive PMH including T2DM, gastroparesis, CKD, neurogenic bladder, frequent UTIs, carotid artery aneurysm status post repair who presents with vomiting. The patient states that she has had intermittent episodes of vomiting for the past several days. It is not after every meal but happens randomly. She has had problems with vomiting occasionally in the past related to gastroparesis. She denies any abdominal pain, diarrhea, or blood in her stool. No fevers. She states that she gets frequent UTIs and finished a course of Bactrim last week for a urinary tract infection. She does endorse burning with urination and urinary frequency. She denies any headache or chest pain.  Patient is a 74 y.o. female presenting with vomiting. The history is provided by the patient.  Emesis   Past Medical History  Diagnosis Date  . Asthma   . Depression   . Type 2 diabetes mellitus with diabetic neuropathy   . GERD (gastroesophageal reflux disease)   . Essential hypertension, benign   . Low back pain   . IBS (irritable bowel syndrome)   . History of recurrent UTIs   . Cataract   . Glaucoma   . Arthritis   . Hx of migraines   . Carpal tunnel syndrome   . Morbid obesity   . Sleep apnea     Intolerant to CPAP   . Hx of colonic polyps   . Anxiety   . Carotid artery aneurysm     Right s/p endovascular treatment 2004  . Neurogenic bladder   . Stroke   . Osteoporosis   . Hypothyroidism   . CKD (chronic kidney disease) stage 3, GFR 30-59 ml/min    Past Surgical History  Procedure Laterality Date  . Vesicovaginal fistula closure w/ tah    . Carpal tunnel release - bilateral  1992  . Total hip replacement -  right  2002  . Stomach surgery gastropexy for gastric volvulus  2009  . Tubular adenoma    . Shoulder surgery      Rotator cuff repair, 2012  . Spinal cord stimulator insertion    . Colonoscopy  06/14/2006    SLF:two 3 mm cecal polyps/pancolonic diverticulosis/sigmoid colon lipoma, tubular adenoma.   . Esophagogastroduodenoscopy  08/12/2007    WUJ:[id number] esophagus without evidence of Barrett's,mass, erosion ulceration or stricture.  The GE junction was at 40 cm.  The Bravo  capsule was placed at 34 cm/ Unable to appreciate a hiatal hernia in the retroflexed viewNormal duodenal bulb and second portion of the duodenum. mild gastritis.   . Colonoscopy N/A 03/04/2013    Procedure: COLONOSCOPY;  Surgeon: West Bali, MD;  Location: [facility];  Service: Endoscopy;  Laterality: N/A;  11:30-moved to 12:35 Soledad Gerlach to notify pt  . Cerebral aneurysm repair  2004    Coil   . Abdominal hysterectomy     Family History  Problem Relation Age of Onset  . Heart disease Mother   . Cancer Mother   . Diabetes Brother   . Cancer Brother     Prostate   . Cancer Brother     Pancreatic   . Cancer Sister     Breast   . Heart attack Sister   . Lung disease    .  Asthma    . Colon cancer Neg Hx    History  Substance Use Topics  . Smoking status: Former Smoker    Types: Cigarettes  . Smokeless tobacco: Never Used  . Alcohol Use: No   OB History    Gravida Para Term Preterm AB TAB SAB Ectopic Multiple Living            6     Review of Systems  Gastrointestinal: Positive for vomiting.    10 Systems reviewed and are negative for acute change except as noted in the HPI.   Allergies  Review of patient's allergies indicates no known allergies.  Home Medications   Prior to Admission medications   Medication Sig Start Date End Date Taking? Authorizing Provider  alendronate (FOSAMAX) 70 MG tablet Take 70 mg by mouth every Wednesday. Take with a full glass of water on an empty stomach.     Historical Provider, MD  amLODipine (NORVASC) 10 MG tablet Take 10 mg by mouth daily. 02/05/14   Historical Provider, MD  celecoxib (CELEBREX) 100 MG capsule Take 1 capsule (100 mg total) by mouth 2 (two) times daily. 03/03/14   Bethann Berkshire, MD  colchicine 0.6 MG tablet Take 1 tablet (0.6 mg total) by mouth every 6 (six) hours. 08/24/14   Vickki Hearing, MD  dicyclomine (BENTYL) 10 MG capsule 1 po qac and hs AS NEEDED FOR ABDOMINAL CRAMPS AND LOOSE STOOLS Patient taking differently: Take 10 mg by mouth 4 (four) times daily -  before meals and at bedtime. ABDOMINAL CRAMPS AND LOOSE STOOLS 11/20/13   West Bali, MD  doxepin (SINEQUAN) 25 MG capsule Take 100 mg by mouth at bedtime.  06/30/10   Historical Provider, MD  doxycycline (VIBRAMYCIN) 100 MG capsule Take 1 capsule (100 mg total) by mouth 2 (two) times daily. 04/17/14   Vanetta Mulders, MD  furosemide (LASIX) 20 MG tablet Take 20 mg by mouth daily as needed for fluid.     Historical Provider, MD  gabapentin (NEURONTIN) 100 MG capsule Take 100 mg by mouth 2 (two) times daily as needed (pain).    Historical Provider, MD  glimepiride (AMARYL) 4 MG tablet Take 4 mg by mouth daily.  06/30/10   Historical Provider, MD  HYDROcodone-acetaminophen (NORCO/VICODIN) 5-325 MG per tablet Take 1 tablet by mouth every 6 (six) hours as needed. 04/15/14   Kristen N Ward, DO  levothyroxine (SYNTHROID, LEVOTHROID) 25 MCG tablet Take 25 mcg by mouth daily.    Historical Provider, MD  losartan (COZAAR) 25 MG tablet Take 25 mg by mouth daily.  04/29/13   Historical Provider, MD  meclizine (ANTIVERT) 25 MG tablet Take 25 mg by mouth 3 (three) times daily as needed (vertigo).  10/26/10   Historical Provider, MD  meloxicam (MOBIC) 7.5 MG tablet Take 1 tablet (7.5 mg total) by mouth daily. 08/12/13   Vickki Hearing, MD  metFORMIN (GLUCOPHAGE) 500 MG tablet Take 500 mg by mouth 2 (two) times daily with a meal.      Historical Provider, MD  naproxen (NAPROSYN) 500 MG  tablet Take 1 tablet (500 mg total) by mouth 2 (two) times daily with a meal. 08/24/14   Vickki Hearing, MD  omeprazole (PRILOSEC) 20 MG capsule Take 20 mg by mouth 2 (two) times daily.     Historical Provider, MD  oxyCODONE-acetaminophen (PERCOCET) 10-325 MG per tablet Take 1 tablet by mouth 3 (three) times daily as needed. 09/23/14   Historical Provider, MD  Probiotic  Product (PROBIOTIC DAILY PO) Take 1 tablet by mouth daily.    Historical Provider, MD  promethazine (PHENERGAN) 25 MG tablet Take 25 mg by mouth 2 (two) times daily as needed for nausea.  07/24/10   Historical Provider, MD  Vitamin D, Ergocalciferol, (DRISDOL) 50000 UNITS CAPS capsule Take 50,000 Units by mouth every Wednesday.    Historical Provider, MD   BP 138/65 mmHg  Pulse 67  Temp(Src) 98.1 F (36.7 C) (Oral)  Resp 16  Ht 5' (1.524 m)  Wt 230 lb (104.327 kg)  BMI 44.92 kg/m2  SpO2 100% Physical Exam  Constitutional: She is oriented to person, place, and time. She appears well-developed and well-nourished. No distress.  HENT:  Head: Normocephalic and atraumatic.  Moist mucous membranes  Eyes: Conjunctivae are normal. Pupils are equal, round, and reactive to light.  Neck: Neck supple.  Cardiovascular: Normal rate, regular rhythm and normal heart sounds.   No murmur heard. Pulmonary/Chest: Effort normal and breath sounds normal.  Abdominal: Soft. Bowel sounds are normal. She exhibits no distension. There is no tenderness.  Musculoskeletal:  2+ b/l LE edema  Neurological: She is alert and oriented to person, place, and time.  Fluent speech, normal gait  Skin: Skin is warm and dry.  Psychiatric: She has a normal mood and affect. Judgment normal.  pleasant  Nursing note and vitals reviewed.   ED Course  Procedures (including critical care time) Labs Review Labs Reviewed  CBC WITH DIFFERENTIAL/PLATELET - Abnormal; Notable for the following:    RBC 3.81 (*)    Hemoglobin 10.7 (*)    HCT 33.5 (*)     Neutrophils Relative % 42 (*)    Lymphocytes Relative 50 (*)    All other components within normal limits  COMPREHENSIVE METABOLIC PANEL - Abnormal; Notable for the following:    Glucose, Bld 122 (*)    BUN 24 (*)    Creatinine, Ser 1.53 (*)    Calcium 8.8 (*)    GFR calc non Af Amer 32 (*)    GFR calc Af Amer 37 (*)    All other components within normal limits  LIPASE, BLOOD - Abnormal; Notable for the following:    Lipase 19 (*)    All other components within normal limits  URINALYSIS, ROUTINE W REFLEX MICROSCOPIC (NOT AT [facility]) - Abnormal; Notable for the following:    Hgb urine dipstick TRACE (*)    Leukocytes, UA MODERATE (*)    All other components within normal limits  URINE MICROSCOPIC-ADD ON - Abnormal; Notable for the following:    Squamous Epithelial / LPF MANY (*)    Bacteria, UA MANY (*)    All other components within normal limits  I-STAT CG4 LACTIC ACID, ED    Imaging Review No results found.   EKG Interpretation None       MDM   Final diagnoses:  None  urinary tract infection Vomiting   74 year old female who presents with several episodes of vomiting over the past few days. No abdominal pain, diarrhea, or fevers. Patient well-appearing at presentation with normal vital signs. No tenderness on exam. Obtained above lab work which was notable for ongoing urinary tract infection, consistent with patient's report of dysuria and urinary frequency. Creatinine 1.53, which is close to baseline chronic kidney disease. Patient with no evidence of dehydration on exam. Because of multiple failed IV attempts, gave the patient oral Zofran after which she was by mouth challenged.  Gave the patient a dose of IM ceftriaxone  for her urinary tract infection. Reexamination, the patient is well-appearing and has been able to drink and eat crackers with no vomiting. I will provide her with Keflex for UTI and I have sent culture. Also providing with antiemetic. Instructed patient  to follow-up with PCP to ensure resolution of her symptoms. Return precautions reviewed and the patient and her family voiced understanding. Patient discharged in satisfactory condition.  Laurence Spates, MD 11/04/14 [phone]

## 2014-11-07 LAB — URINE CULTURE: Culture: >=100000

## 2014-11-09 ENCOUNTER — Telehealth (HOSPITAL_COMMUNITY): Payer: Self-pay

## 2014-11-09 NOTE — Telephone Encounter (Signed)
Post ED Visit - Positive Culture Follow-up  Culture report reviewed by antimicrobial stewardship pharmacist:  Wes Dulaney, Pharm.D., BCPS  Celedonio Miyamoto, Pharm.D., BCPS  Georgina Pillion, [address].D., BCPS  [place], [address].D., BCPS, AAHIVP  Estella Husk, Pharm.D., BCPS, AAHIVP  Elder Cyphers, [address].D., BCPS  Positive Urine culture, >/= 100,000 colonies -> Klebsiella Pneumoniae Treated with Cephalexin, organism sensitive to the same and no further patient follow-up is required at this time.  Arvid Right 11/09/2014, 5:50 AM

## 2014-11-23 ENCOUNTER — Other Ambulatory Visit: Payer: Self-pay | Admitting: *Deleted

## 2014-11-23 DIAGNOSIS — M170 Bilateral primary osteoarthritis of knee: Secondary | ICD-10-CM

## 2014-11-23 MED ORDER — MELOXICAM 7.5 MG PO TABS: 7.5000 mg | ORAL_TABLET | Freq: Every day | ORAL | Status: DC

## 2014-11-26 ENCOUNTER — Ambulatory Visit: Payer: Medicare HMO | Admitting: Orthopedic Surgery

## 2014-12-11 ENCOUNTER — Other Ambulatory Visit: Payer: Self-pay | Admitting: Gastroenterology

## 2015-01-06 ENCOUNTER — Ambulatory Visit (HOSPITAL_COMMUNITY)
Admission: RE | Admit: 2015-01-06 | Discharge: 2015-01-06 | Disposition: A | Discharge status: Home / Self Care | Payer: Medicare HMO | Source: Ambulatory Visit | Attending: Family Medicine | Admitting: Family Medicine

## 2015-01-06 ENCOUNTER — Other Ambulatory Visit (HOSPITAL_COMMUNITY): Payer: Self-pay | Admitting: Family Medicine

## 2015-01-06 DIAGNOSIS — M19012 Primary osteoarthritis, left shoulder: Secondary | ICD-10-CM | POA: Insufficient documentation

## 2015-01-06 DIAGNOSIS — M25512 Pain in left shoulder: Secondary | ICD-10-CM | POA: Diagnosis present

## 2015-01-12 ENCOUNTER — Ambulatory Visit (INDEPENDENT_AMBULATORY_CARE_PROVIDER_SITE_OTHER): Payer: Medicare HMO | Admitting: Gastroenterology

## 2015-01-12 ENCOUNTER — Encounter: Payer: Self-pay | Admitting: Gastroenterology

## 2015-01-12 VITALS — BP 144/76 | HR 83 | Temp 97.2°F | Ht 60.0 in | Wt 232.2 lb

## 2015-01-12 DIAGNOSIS — K580 Irritable bowel syndrome with diarrhea: Secondary | ICD-10-CM | POA: Diagnosis not present

## 2015-01-12 DIAGNOSIS — K219 Gastro-esophageal reflux disease without esophagitis: Secondary | ICD-10-CM

## 2015-01-12 MED ORDER — SUCRALFATE 1 G PO TABS: 1.0000 g | ORAL_TABLET | Freq: Three times a day (TID) | ORAL | Status: DC

## 2015-01-12 NOTE — Assessment & Plan Note (Signed)
SYMPTOMS CONTROLLED/RESOLVED.  CONTINUE TO MONITOR SYMPTOMS. FOLLOW UP IN 6 MOS.  

## 2015-01-12 NOTE — Progress Notes (Signed)
ON RECALL  °

## 2015-01-12 NOTE — Progress Notes (Signed)
Subjective:    Patient ID: [name], female    DOB: [birth year], 74 y.o.   MRN: [id number]  Milana Obey, MD  HPI GRANDDAUGHTER WANTS TO WORK FOR [employer]. 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 HUSBAND ABOUT TO BE 92. GOING TO [employer] THEN GOING TO GO [employer]. BOWELS: #6(LOOSE STOOLS) MAY BE UP TO 4 TIMES A DAY. TAKING BENTYL TWICE A DAY. RAN OUT AND DIDN'T HAVE IT FOR 2-3 WEEKS AND SYMPTOMS GOT WORSE. FELT CONSTIPATED BACK IN AUG 2016. HAD AN IMPACTION AND DISIMPACTED HERSELF WITH A HANDLE.  LASTED FOR A FEW HOURS.MAY HAVE BEEN DUE TO HYDROCODONE. STILL TAKING PROBIOTIC.  GETS SOB WITH WALKING. RARE ABD PAIN ESPECIALLY WHEN SHE WAS CONSTIPATED.  PT DENIES FEVER, CHILLS, HEMATOCHEZIA, nausea, vomiting, melena, diarrhea, CHEST PAIN, SHORTNESS OF BREATH,  Abdominal pain, problems swallowing, OR heartburn or indigestion.  Past Medical History  Diagnosis Date  . Asthma   . Depression   . Type 2 diabetes mellitus with diabetic neuropathy   . GERD (gastroesophageal reflux disease)   . Essential hypertension, benign   . Low back pain   . IBS (irritable bowel syndrome)   . History of recurrent UTIs   . Cataract   . Glaucoma   . Arthritis   . Hx of migraines   . Carpal tunnel syndrome   . Morbid obesity   . Sleep apnea     Intolerant to CPAP   . Hx of colonic polyps   . Anxiety   . Carotid artery aneurysm     Right s/p endovascular treatment 2004  . Neurogenic bladder   . Stroke   . Osteoporosis   . Hypothyroidism   . CKD (chronic kidney disease) stage 3, GFR 30-59 ml/min    Past Surgical History  Procedure Laterality Date  . Vesicovaginal fistula closure w/ tah    . Carpal tunnel release - bilateral  1992  . Total hip replacement - right  2002  . Stomach surgery gastropexy for gastric volvulus  2009  . Tubular adenoma    . Shoulder surgery      Rotator cuff repair, 2012  . Spinal cord stimulator insertion    . Esophagogastroduodenoscopy   08/12/2007    [id number] esophagus without evidence of Barrett's,mass, erosion ulceration or stricture.  The GE junction was at 40 cm.  The Bravo  capsule was placed at 34 cm/ Unable to appreciate a hiatal hernia in the retroflexed viewNormal duodenal bulb and second portion of the duodenum. mild gastritis.   . Colonoscopy N/A 03/04/2013    Procedure: COLONOSCOPY;  Surgeon: West Bali, MD;  Location: [facility];  Service: Endoscopy;  Laterality: N/A;  11:30-moved to 12:35 Soledad Gerlach to notify pt  . Cerebral aneurysm repair  2004    Coil   . Abdominal hysterectomy    . Colonoscopy  06/14/2006    ONG:EXBMWU ADENOMAS(2), pTICS, [place] lipoma    No Known Allergies  Current Outpatient Prescriptions  Medication Sig Dispense Refill  . alendronate (FOSAMAX) 70 MG tablet Take 70 mg by mouth every Wednesday. Take with a full glass of water on an empty stomach.    Marland Kitchen amLODipine (NORVASC) 10 MG tablet Take 10 mg by mouth daily.    . cephALEXin (KEFLEX) 500 MG capsule Take 1 capsule (500 mg total) by mouth 2 (two) times daily.    . colchicine 0.6 MG tablet Take 1 tablet (0.6 mg total) by mouth every  6 (six) hours.    Marland Kitchen dicyclomine (BENTYL) 10 MG capsule TAKE 1 CAPSULE BY MOUTH BEFORE MEALS AND AT BEDTIME AS NEEDED FOR ABDOMINAL CRAMPS AND DIARRHEA.    Marland Kitchen doxepin (SINEQUAN) 25 MG capsule Take 100 mg by mouth at bedtime.     Marland Kitchen doxycycline (VIBRAMYCIN) 100 MG capsule Take 1 capsule (100 mg total) by mouth 2 (two) times daily.    . furosemide (LASIX) 20 MG tablet Take 20 mg by mouth daily as needed for fluid.     Marland Kitchen gabapentin (NEURONTIN) 100 MG capsule Take 100 mg by mouth 2 (two) times daily as needed (pain).    Marland Kitchen glimepiride (AMARYL) 4 MG tablet Take 4 mg by mouth daily.     Marland Kitchen levothyroxine (SYNTHROID, LEVOTHROID) 25 MCG tablet Take 25 mcg by mouth daily.    Marland Kitchen losartan (COZAAR) 25 MG tablet Take 25 mg by mouth daily.     . meloxicam (MOBIC) 7.5 MG tablet Take 1 tablet (7.5 mg total) by mouth daily.    .  metFORMIN (GLUCOPHAGE) 500 MG tablet Take 500 mg by mouth 2 (two) times daily with a meal.      . Probiotic Product (PROBIOTIC DAILY PO) Take 1 tablet by mouth daily.    . promethazine (PHENERGAN) 25 MG tablet Take 25 mg by mouth 2 (two) times daily as needed for nausea.     . Vitamin D, Ergocalciferol, (DRISDOL) 50000 UNITS CAPS capsule Take 50,000 Units by mouth every Wednesday.    .      .      . meclizine (ANTIVERT) 25 MG tablet Take 25 mg by mouth 3 (three) times daily as needed (vertigo).     .      . omeprazole (PRILOSEC) 20 MG capsule Take 20 mg by mouth 2 (two) times daily.     .      . oxyCODONE-acetaminophen (PERCOCET) 10-325 MG per tablet Take 1 tablet by mouth 3 (three) times daily as needed.     Review of Systems PER HPI OTHERWISE ALL SYSTEMS ARE NEGATIVE.    Objective:   Physical Exam  Constitutional: She is oriented to person, place, and time. She appears well-developed and well-nourished. No distress.  HENT:  Head: Normocephalic and atraumatic.  Mouth/Throat: Oropharynx is clear and moist. No oropharyngeal exudate.  Eyes: Pupils are equal, round, and reactive to light. No scleral icterus.  Neck: Normal range of motion. Neck supple.  Cardiovascular: Normal rate, regular rhythm and normal heart sounds.   Pulmonary/Chest: Effort normal and breath sounds normal. No respiratory distress.  Abdominal: Soft. Bowel sounds are normal. She exhibits no distension. There is no tenderness.  Musculoskeletal: She exhibits no edema.  Lymphadenopathy:    She has no cervical adenopathy.  Neurological: She is alert and oriented to person, place, and time.  Psychiatric: She has a normal mood and affect.  Vitals reviewed.     Assessment & Plan:

## 2015-01-12 NOTE — Assessment & Plan Note (Signed)
SYMPTOMS FAIRLY WELL CONTROLLED.  DRINK WATER TO KEEP YOUR URINE LIGHT YELLOW. FOLLOW A HIGH FIBER DIET. AVOID ITEMS THAT CAUSE BLOATING & GAS. SEE INFO BELOW. CARAFATE PRN BENTYL BID FOLLOW UP IN 6 MOS.

## 2015-01-12 NOTE — Patient Instructions (Signed)
DRINK WATER TO KEEP YOUR URINE LIGHT YELLOW.  FOLLOW A HIGH FIBER DIET. AVOID ITEMS THAT CAUSE BLOATING & GAS. SEE INFO BELOW.  USE CARAFATE AS NEEDED FOR INDIGESTION.  BENTYL TWICE DAILY. AVOID HIGHER DOSES TO PREVENT CONSTIPATION.  CONTINUE OMEPRAZOLE.  FOLLOW UP IN 6 MOS.  High-Fiber Diet A high-fiber diet changes your normal diet to include more whole grains, legumes, fruits, and vegetables. Changes in the diet involve replacing refined carbohydrates with unrefined foods. The calorie level of the diet is essentially unchanged. The Dietary Reference Intake (recommended amount) for adult males is 38 grams per day. For adult females, it is 25 grams per day. Pregnant and lactating women should consume 28 grams of fiber per day. Fiber is the intact part of a plant that is not broken down during digestion. Functional fiber is fiber that has been isolated from the plant to provide a beneficial effect in the body. PURPOSE  Increase stool bulk.   Ease and regulate bowel movements.   Lower cholesterol.  REDUCE RISK OF COLON CANCER  INDICATIONS THAT YOU NEED MORE FIBER  Constipation and hemorrhoids.   Uncomplicated diverticulosis (intestine condition) and irritable bowel syndrome.   Weight management.   As a protective measure against hardening of the arteries (atherosclerosis), diabetes, and cancer.   GUIDELINES FOR INCREASING FIBER IN THE DIET  Start adding fiber to the diet slowly. A gradual increase of about 5 more grams (2 slices of whole-wheat bread, 2 servings of most fruits or vegetables, or 1 bowl of high-fiber cereal) per day is best. Too rapid an increase in fiber may result in constipation, flatulence, and bloating.   Drink enough water and fluids to keep your urine clear or pale yellow. Water, juice, or caffeine-free drinks are recommended. Not drinking enough fluid may cause constipation.   Eat a variety of high-fiber foods rather than one type of fiber.   Try to  increase your intake of fiber through using high-fiber foods rather than fiber pills or supplements that contain small amounts of fiber.   The goal is to change the types of food eaten. Do not supplement your present diet with high-fiber foods, but replace foods in your present diet.   INCLUDE A VARIETY OF FIBER SOURCES  Replace refined and processed grains with whole grains, canned fruits with fresh fruits, and incorporate other fiber sources. White rice, white breads, and most bakery goods contain little or no fiber.   Brown whole-grain rice, buckwheat oats, and many fruits and vegetables are all good sources of fiber. These include: broccoli, Brussels sprouts, cabbage, cauliflower, beets, sweet potatoes, white potatoes (skin on), carrots, tomatoes, eggplant, squash, berries, fresh fruits, and dried fruits.   Cereals appear to be the richest source of fiber. Cereal fiber is found in whole grains and bran. Bran is the fiber-rich outer coat of cereal grain, which is largely removed in refining. In whole-grain cereals, the bran remains. In breakfast cereals, the largest amount of fiber is found in those with "bran" in their names. The fiber content is sometimes indicated on the label.   You may need to include additional fruits and vegetables each day.   In baking, for 1 cup white flour, you may use the following substitutions:   1 cup whole-wheat flour minus 2 tablespoons.   1/2 cup white flour plus 1/2 cup whole-wheat flour.

## 2015-01-12 NOTE — Progress Notes (Signed)
cc'ed to pcp °

## 2015-01-19 ENCOUNTER — Other Ambulatory Visit: Payer: Self-pay | Admitting: *Deleted

## 2015-01-19 ENCOUNTER — Ambulatory Visit (INDEPENDENT_AMBULATORY_CARE_PROVIDER_SITE_OTHER): Payer: Medicare HMO | Admitting: Orthopedic Surgery

## 2015-01-19 ENCOUNTER — Encounter: Payer: Self-pay | Admitting: Orthopedic Surgery

## 2015-01-19 VITALS — BP 140/118 | Ht 60.0 in | Wt 232.2 lb

## 2015-01-19 DIAGNOSIS — M170 Bilateral primary osteoarthritis of knee: Secondary | ICD-10-CM

## 2015-01-19 DIAGNOSIS — M75102 Unspecified rotator cuff tear or rupture of left shoulder, not specified as traumatic: Secondary | ICD-10-CM

## 2015-01-19 MED ORDER — MELOXICAM 7.5 MG PO TABS: 7.5000 mg | ORAL_TABLET | Freq: Every day | ORAL | Status: DC

## 2015-01-19 NOTE — Progress Notes (Signed)
Patient ID: [name], female   DOB: Dec 21, 1940, 74 y.o.   MRN: [id number]  ESTABLISHED PATIENT NEW PROBLEM   Chief Complaint  Patient presents with  . Shoulder Pain    left shoulder pain s/p fall DOI 12/07/14     [name] is a 74 y.o. female.   HPI 34 rolled female fell out of bed a month ago around September 5 felt acute pain in her left shoulder. Since that time she has not been able to lift arm over her head. She complains of left shoulder aching pain moderate in severity 6 weeks. Symptoms are improved with rest and worse with movement. Patient takes tramadol and codeine hydrocodone makes her nauseous and vomiting  Review of systems night sweats fatigue hearing loss or throat and a problems vision PROM shortness of breath wheezing issues wheezing chest pain ankle edema nausea diarrhea constipation abdominal pain back pain swollen joints gait disturbance joint limb pain seasonal allergies burning pain and tingling in her legs with weakness and dizziness lightheadedness on occasion. Depression anxiety cold intolerance frequent and excessive nighttime urination loss of bladder control   Review of Systems See hpi  Past Medical History  Diagnosis Date  . Asthma   . Depression   . Type 2 diabetes mellitus with diabetic neuropathy (HCC)   . GERD (gastroesophageal reflux disease)   . Essential hypertension, benign   . Low back pain   . IBS (irritable bowel syndrome)   . History of recurrent UTIs   . Cataract   . Glaucoma   . Arthritis   . Hx of migraines   . Carpal tunnel syndrome   . Morbid obesity (HCC)   . Sleep apnea     Intolerant to CPAP   . Hx of colonic polyps   . Anxiety   . Carotid artery aneurysm [facility])     Right s/p endovascular treatment 2004  . Neurogenic bladder   . Stroke (HCC)   . Osteoporosis   . Hypothyroidism   . CKD (chronic kidney disease) stage 3, GFR 30-59 ml/min     Past Surgical History  Procedure Laterality Date  . Vesicovaginal  fistula closure w/ tah    . Carpal tunnel release - bilateral  1992  . Total hip replacement - right  2002  . Stomach surgery gastropexy for gastric volvulus  2009  . Tubular adenoma    . Shoulder surgery      Rotator cuff repair, 2012  . Spinal cord stimulator insertion    . Esophagogastroduodenoscopy  08/12/2007    OZH:YQMVHQ esophagus without evidence of Barrett's,mass, erosion ulceration or stricture.  The GE junction was at 40 cm.  The Bravo  capsule was placed at 34 cm/ Unable to appreciate a hiatal hernia in the retroflexed viewNormal duodenal bulb and second portion of the duodenum. mild gastritis.   . Colonoscopy N/A 03/04/2013    Procedure: COLONOSCOPY;  Surgeon: West Bali, MD;  [facility]: [facility];  Service: Endoscopy;  Laterality: N/A;  11:30-moved to 12:35 Soledad Gerlach to notify pt  . Cerebral aneurysm repair  2004    Coil   . Abdominal hysterectomy    . Colonoscopy  06/14/2006    ION:GEXBMW ADENOMAS(2), pTICS, [place] lipoma    Family History  Problem Relation Age of Onset  . Heart disease Mother   . Cancer Mother   . Diabetes Brother   . Cancer Brother     Prostate   . Cancer Brother  Pancreatic   . Cancer Sister     Breast   . Heart attack Sister   . Lung disease    . Asthma    . Colon cancer Neg Hx   . Colon polyps Neg Hx     Social History Social History  Substance Use Topics  . Smoking status: Former Smoker    Types: Cigarettes  . Smokeless tobacco: Never Used  . Alcohol Use: No    Allergies  Allergen Reactions  . Hydrocodone Nausea And Vomiting    Current Outpatient Prescriptions  Medication Sig Dispense Refill  . alendronate (FOSAMAX) 70 MG tablet Take 70 mg by mouth every Wednesday. Take with a full glass of water on an empty stomach.    Marland Kitchen amLODipine (NORVASC) 10 MG tablet Take 10 mg by mouth daily.    . cephALEXin (KEFLEX) 500 MG capsule Take 1 capsule (500 mg total) by mouth 2 (two) times daily. 28 capsule 0  . colchicine 0.6 MG  tablet Take 1 tablet (0.6 mg total) by mouth every 6 (six) hours. 40 tablet 0  . dicyclomine (BENTYL) 10 MG capsule TAKE 1 CAPSULE BY MOUTH BEFORE MEALS AND AT BEDTIME AS NEEDED FOR ABDOMINAL CRAMPS AND DIARRHEA. 60 capsule 3  . doxepin (SINEQUAN) 25 MG capsule Take 100 mg by mouth at bedtime.     Marland Kitchen doxycycline (VIBRAMYCIN) 100 MG capsule Take 1 capsule (100 mg total) by mouth 2 (two) times daily. 14 capsule 0  . furosemide (LASIX) 20 MG tablet Take 20 mg by mouth daily as needed for fluid.     Marland Kitchen gabapentin (NEURONTIN) 100 MG capsule Take 100 mg by mouth 2 (two) times daily as needed (pain).    Marland Kitchen glimepiride (AMARYL) 4 MG tablet Take 4 mg by mouth daily.     Marland Kitchen levothyroxine (SYNTHROID, LEVOTHROID) 25 MCG tablet Take 25 mcg by mouth daily.    Marland Kitchen losartan (COZAAR) 25 MG tablet Take 25 mg by mouth daily.     . meloxicam (MOBIC) 7.5 MG tablet Take 1 tablet (7.5 mg total) by mouth daily. 60 tablet 0  . metFORMIN (GLUCOPHAGE) 500 MG tablet Take 500 mg by mouth 2 (two) times daily with a meal.      . omeprazole (PRILOSEC) 20 MG capsule Take 20 mg by mouth 2 (two) times daily.     . Probiotic Product (PROBIOTIC DAILY PO) Take 1 tablet by mouth daily.    . promethazine (PHENERGAN) 25 MG tablet Take 25 mg by mouth 2 (two) times daily as needed for nausea.     . sucralfate (CARAFATE) 1 G tablet Take 1 tablet (1 g total) by mouth 4 (four) times daily -  with meals and at bedtime. Prn for indigestion 120 tablet 3  . Vitamin D, Ergocalciferol, (DRISDOL) 50000 UNITS CAPS capsule Take 50,000 Units by mouth every Wednesday.    . naproxen (NAPROSYN) 500 MG tablet Take 1 tablet (500 mg total) by mouth 2 (two) times daily with a meal. (Patient not taking: Reported on 01/12/2015) 60 tablet 0   No current facility-administered medications for this visit.       Physical Exam Blood pressure 140/118, height 5' (1.524 m), weight 232 lb 3.2 oz (105.325 kg). Physical Exam The patient is well developed well nourished  and well groomed. Orientation to person place and time is normal  Mood is pleasant. Ambulatory status is normal without a limp Skin remains intact without laceration ulceration or erythema Gross motor exam is intact without atrophy.  Muscle tone normal grade 5 motor strength Neurovascular exam remains intact Inspection left shoulder pain. Acromial tenderness deltoid tenderness  Her external rotation is right at 40 internal rotation to the front pocket. Flexion 80 abduction 80. Stability inferiorly normal. Abduction external rotation not possible because of pain. Internal/external rotation strength normal. Passively still couldn't get arm in position to do the drop test.  Axillary lymph nodes clavicular lymph nodes normal  Data Reviewed  I have independently reviewed the radiographs and my interpretation is:  AP lateral and axillary view of the left shoulder mild degenerative changes in the glenohumeral joint   Assessment  Probably has rotator cuff tear left shoulder Plan   Recommend MRI to evaluate rotator cuff tear and plan for surgical intervention. Patient did well with right rotator cuff repair via open technique

## 2015-01-19 NOTE — Patient Instructions (Signed)
We will schedule a MRI for you and call you with appointment

## 2015-02-11 ENCOUNTER — Ambulatory Visit (HOSPITAL_COMMUNITY): Payer: Medicare HMO

## 2015-02-20 ENCOUNTER — Encounter (HOSPITAL_COMMUNITY): Payer: Self-pay | Admitting: Emergency Medicine

## 2015-02-20 ENCOUNTER — Emergency Department (HOSPITAL_COMMUNITY): Payer: Medicare HMO

## 2015-02-20 ENCOUNTER — Emergency Department (HOSPITAL_COMMUNITY)
Admission: EM | Admit: 2015-02-20 | Discharge: 2015-02-20 | Disposition: A | Discharge status: Home / Self Care | Payer: Medicare HMO | Attending: Emergency Medicine | Admitting: Emergency Medicine

## 2015-02-20 DIAGNOSIS — Z7984 Long term (current) use of oral hypoglycemic drugs: Secondary | ICD-10-CM | POA: Insufficient documentation

## 2015-02-20 DIAGNOSIS — M810 Age-related osteoporosis without current pathological fracture: Secondary | ICD-10-CM | POA: Diagnosis not present

## 2015-02-20 DIAGNOSIS — M1990 Unspecified osteoarthritis, unspecified site: Secondary | ICD-10-CM | POA: Diagnosis not present

## 2015-02-20 DIAGNOSIS — K589 Irritable bowel syndrome without diarrhea: Secondary | ICD-10-CM | POA: Diagnosis not present

## 2015-02-20 DIAGNOSIS — F329 Major depressive disorder, single episode, unspecified: Secondary | ICD-10-CM | POA: Insufficient documentation

## 2015-02-20 DIAGNOSIS — E039 Hypothyroidism, unspecified: Secondary | ICD-10-CM | POA: Diagnosis not present

## 2015-02-20 DIAGNOSIS — Z791 Long term (current) use of non-steroidal anti-inflammatories (NSAID): Secondary | ICD-10-CM | POA: Insufficient documentation

## 2015-02-20 DIAGNOSIS — Z86010 Personal history of colonic polyps: Secondary | ICD-10-CM | POA: Insufficient documentation

## 2015-02-20 DIAGNOSIS — I129 Hypertensive chronic kidney disease with stage 1 through stage 4 chronic kidney disease, or unspecified chronic kidney disease: Secondary | ICD-10-CM | POA: Insufficient documentation

## 2015-02-20 DIAGNOSIS — Z79899 Other long term (current) drug therapy: Secondary | ICD-10-CM | POA: Diagnosis not present

## 2015-02-20 DIAGNOSIS — H269 Unspecified cataract: Secondary | ICD-10-CM | POA: Diagnosis not present

## 2015-02-20 DIAGNOSIS — Z8673 Personal history of transient ischemic attack (TIA), and cerebral infarction without residual deficits: Secondary | ICD-10-CM | POA: Insufficient documentation

## 2015-02-20 DIAGNOSIS — K219 Gastro-esophageal reflux disease without esophagitis: Secondary | ICD-10-CM | POA: Insufficient documentation

## 2015-02-20 DIAGNOSIS — G43909 Migraine, unspecified, not intractable, without status migrainosus: Secondary | ICD-10-CM | POA: Insufficient documentation

## 2015-02-20 DIAGNOSIS — Z87440 Personal history of urinary (tract) infections: Secondary | ICD-10-CM | POA: Insufficient documentation

## 2015-02-20 DIAGNOSIS — N183 Chronic kidney disease, stage 3 (moderate): Secondary | ICD-10-CM | POA: Insufficient documentation

## 2015-02-20 DIAGNOSIS — Z87891 Personal history of nicotine dependence: Secondary | ICD-10-CM | POA: Insufficient documentation

## 2015-02-20 DIAGNOSIS — N390 Urinary tract infection, site not specified: Secondary | ICD-10-CM | POA: Insufficient documentation

## 2015-02-20 DIAGNOSIS — F419 Anxiety disorder, unspecified: Secondary | ICD-10-CM | POA: Insufficient documentation

## 2015-02-20 DIAGNOSIS — Z792 Long term (current) use of antibiotics: Secondary | ICD-10-CM | POA: Diagnosis not present

## 2015-02-20 DIAGNOSIS — J45909 Unspecified asthma, uncomplicated: Secondary | ICD-10-CM | POA: Insufficient documentation

## 2015-02-20 DIAGNOSIS — R002 Palpitations: Secondary | ICD-10-CM | POA: Insufficient documentation

## 2015-02-20 DIAGNOSIS — E1140 Type 2 diabetes mellitus with diabetic neuropathy, unspecified: Secondary | ICD-10-CM | POA: Insufficient documentation

## 2015-02-20 DIAGNOSIS — E1165 Type 2 diabetes mellitus with hyperglycemia: Secondary | ICD-10-CM | POA: Diagnosis present

## 2015-02-20 LAB — CBC WITH DIFFERENTIAL/PLATELET
BASOS PCT: 0 %
Basophils Absolute: 0 10*3/uL (ref 0.0–0.1)
Eosinophils Absolute: 0.1 10*3/uL (ref 0.0–0.7)
Eosinophils Relative: 1 %
HCT: 33 % — ABNORMAL LOW (ref 36.0–46.0)
HEMOGLOBIN: 10.4 g/dL — AB (ref 12.0–15.0)
LYMPHS PCT: 35 %
Lymphs Abs: 2.5 10*3/uL (ref 0.7–4.0)
MCH: 27 pg (ref 26.0–34.0)
MCHC: 31.5 g/dL (ref 30.0–36.0)
MCV: 85.7 fL (ref 78.0–100.0)
Monocytes Absolute: 0.4 10*3/uL (ref 0.1–1.0)
Monocytes Relative: 6 %
Neutro Abs: 4.2 10*3/uL (ref 1.7–7.7)
Neutrophils Relative %: 58 %
Platelets: 229 10*3/uL (ref 150–400)
RBC: 3.85 MIL/uL — ABNORMAL LOW (ref 3.87–5.11)
RDW: 15 % (ref 11.5–15.5)
WBC: 7.1 10*3/uL (ref 4.0–10.5)

## 2015-02-20 LAB — COMPREHENSIVE METABOLIC PANEL
ALK PHOS: 88 U/L (ref 38–126)
ALT: 26 U/L (ref 14–54)
ANION GAP: 8 (ref 5–15)
AST: 37 U/L (ref 15–41)
Albumin: 3.9 g/dL (ref 3.5–5.0)
BILIRUBIN TOTAL: 0.4 mg/dL (ref 0.3–1.2)
BUN: 16 mg/dL (ref 6–20)
CALCIUM: 8 mg/dL — AB (ref 8.9–10.3)
CO2: 24 mmol/L (ref 22–32)
CREATININE: 1.52 mg/dL — AB (ref 0.44–1.00)
Chloride: 110 mmol/L (ref 101–111)
GFR calc Af Amer: 37 mL/min — ABNORMAL LOW (ref >60)
GFR calc non Af Amer: 32 mL/min — ABNORMAL LOW (ref >60)
GLUCOSE: 78 mg/dL (ref 65–99)
Potassium: 4.5 mmol/L (ref 3.5–5.1)
Sodium: 142 mmol/L (ref 135–145)
TOTAL PROTEIN: 8 g/dL (ref 6.5–8.1)

## 2015-02-20 LAB — URINALYSIS, ROUTINE W REFLEX MICROSCOPIC
BILIRUBIN URINE: NEGATIVE
Glucose, UA: NEGATIVE mg/dL
Hgb urine dipstick: NEGATIVE
Ketones, ur: NEGATIVE mg/dL
NITRITE: NEGATIVE
PH: 5.5 (ref 5.0–8.0)
Protein, ur: NEGATIVE mg/dL
SPECIFIC GRAVITY, URINE: 1.015 (ref 1.005–1.030)

## 2015-02-20 LAB — URINE MICROSCOPIC-ADD ON

## 2015-02-20 LAB — BRAIN NATRIURETIC PEPTIDE: B NATRIURETIC PEPTIDE 5: 53 pg/mL (ref 0.0–100.0)

## 2015-02-20 LAB — I-STAT TROPONIN, ED: Troponin i, poc: 0 ng/mL (ref 0.00–0.08)

## 2015-02-20 MED ORDER — SODIUM CHLORIDE 0.9 % IV BOLUS (SEPSIS)
500.0000 mL | Freq: Once | INTRAVENOUS | Status: AC
  Administered 2015-02-20: 500 mL via INTRAVENOUS

## 2015-02-20 MED ORDER — CIPROFLOXACIN HCL 500 MG PO TABS: 500.0000 mg | ORAL_TABLET | Freq: Two times a day (BID) | ORAL | Status: DC

## 2015-02-20 MED ORDER — CIPROFLOXACIN HCL 250 MG PO TABS
500.0000 mg | ORAL_TABLET | Freq: Once | ORAL | Status: AC
  Administered 2015-02-20: 500 mg via ORAL
  Filled 2015-02-20: qty 2

## 2015-02-20 NOTE — ED Provider Notes (Signed)
CSN: [id number]     Arrival date & time 02/20/15  1738 History   First MD Initiated Contact with Patient 02/20/15 1801     Chief Complaint  Patient presents with  . Hyperglycemia  . Palpitations     (Consider location/radiation/quality/duration/timing/severity/associated sxs/prior Treatment) Patient is a 74 y.o. female presenting with hyperglycemia and palpitations. The history is provided by the patient (Patient complains that her sugar has been elevated and she's felt palpitations recently).  Hyperglycemia Severity:  Mild Onset quality:  Gradual Timing:  Constant Progression:  Waxing and waning Chronicity:  Recurrent Diabetes status:  Controlled with oral medications Context: not change in medication   Associated symptoms: no abdominal pain, no chest pain and no fatigue   Palpitations Associated symptoms: no back pain, no chest pain and no cough     Past Medical History  Diagnosis Date  . Asthma   . Depression   . Type 2 diabetes mellitus with diabetic neuropathy (HCC)   . GERD (gastroesophageal reflux disease)   . Essential hypertension, benign   . Low back pain   . IBS (irritable bowel syndrome)   . History of recurrent UTIs   . Cataract   . Glaucoma   . Arthritis   . Hx of migraines   . Carpal tunnel syndrome   . Morbid obesity (HCC)   . Sleep apnea     Intolerant to CPAP   . Hx of colonic polyps   . Anxiety   . Carotid artery aneurysm [facility])     Right s/p endovascular treatment 2004  . Neurogenic bladder   . Stroke (HCC)   . Osteoporosis   . Hypothyroidism   . CKD (chronic kidney disease) stage 3, GFR 30-59 ml/min    Past Surgical History  Procedure Laterality Date  . Vesicovaginal fistula closure w/ tah    . Carpal tunnel release - bilateral  1992  . Total hip replacement - right  2002  . Stomach surgery gastropexy for gastric volvulus  2009  . Tubular adenoma    . Shoulder surgery      Rotator cuff repair, 2012  . Spinal cord stimulator insertion     . Esophagogastroduodenoscopy  08/12/2007    WGN:FAOZHY esophagus without evidence of Barrett's,mass, erosion ulceration or stricture.  The GE junction was at 40 cm.  The Bravo  capsule was placed at 34 cm/ Unable to appreciate a hiatal hernia in the retroflexed viewNormal duodenal bulb and second portion of the duodenum. mild gastritis.   . Colonoscopy N/A 03/04/2013    Procedure: COLONOSCOPY;  Surgeon: West Bali, MD;  Location[facility];  Service: Endoscopy;  Laterality: N/A;  11:30-moved to 12:35 Soledad Gerlach to notify pt  . Cerebral aneurysm repair  2004    Coil   . Abdominal hysterectomy    . Colonoscopy  06/14/2006    QMV:HQIONG ADENOMAS(2), pTICS, [place] lipoma   Family History  Problem Relation Age of Onset  . Heart disease Mother   . Cancer Mother   . Diabetes Brother   . Cancer Brother     Prostate   . Cancer Brother     Pancreatic   . Cancer Sister     Breast   . Heart attack Sister   . Lung disease    . Asthma    . Colon cancer Neg Hx   . Colon polyps Neg Hx    Social History  Substance Use Topics  . Smoking status: Former Smoker  Types: Cigarettes  . Smokeless tobacco: Never Used  . Alcohol Use: No   OB History    Gravida Para Term Preterm AB TAB SAB Ectopic Multiple Living            6     Review of Systems  Constitutional: Negative for appetite change and fatigue.  HENT: Negative for congestion, ear discharge and sinus pressure.   Eyes: Negative for discharge.  Respiratory: Negative for cough.   Cardiovascular: Positive for palpitations. Negative for chest pain.  Gastrointestinal: Negative for abdominal pain and diarrhea.  Genitourinary: Negative for frequency and hematuria.  Musculoskeletal: Negative for back pain.  Skin: Negative for rash.  Neurological: Negative for seizures and headaches.  Psychiatric/Behavioral: Negative for hallucinations.      Allergies  Hydrocodone  Home Medications   Prior to Admission medications   Medication  Sig Start Date End Date Taking? Authorizing Provider  albuterol (PROVENTIL HFA;VENTOLIN HFA) 108 (90 BASE) MCG/ACT inhaler Inhale 2 puffs into the lungs every 6 (six) hours as needed for wheezing or shortness of breath.   Yes Historical Provider, MD  albuterol (PROVENTIL) (2.5 MG/3ML) 0.083% nebulizer solution Take 2.5 mg by nebulization every 6 (six) hours as needed for wheezing or shortness of breath.   Yes Historical Provider, MD  amLODipine (NORVASC) 10 MG tablet Take 10 mg by mouth daily. 02/05/14  Yes Historical Provider, MD  Cholecalciferol (VITAMIN D3) 5000 UNITS CAPS Take 5,000 Units by mouth daily.   Yes Historical Provider, MD  colchicine 0.6 MG tablet Take 1 tablet (0.6 mg total) by mouth every 6 (six) hours. 08/24/14  Yes Vickki HearingStanley E Herling, MD  Cyanocobalamin (VITAMIN B 12 PO) Take 1 tablet by mouth daily.   Yes Historical Provider, MD  dicyclomine (BENTYL) 10 MG capsule TAKE 1 CAPSULE BY MOUTH BEFORE MEALS AND AT BEDTIME AS NEEDED FOR ABDOMINAL CRAMPS AND DIARRHEA. 12/15/14  Yes Nira RetortAnna W Sams, NP  doxepin (SINEQUAN) 25 MG capsule Take 100 mg by mouth at bedtime.  06/30/10  Yes Historical Provider, MD  furosemide (LASIX) 20 MG tablet Take 20 mg by mouth daily as needed for fluid.    Yes Historical Provider, MD  gabapentin (NEURONTIN) 100 MG capsule Take 100 mg by mouth 2 (two) times daily as needed (pain).   Yes Historical Provider, MD  glimepiride (AMARYL) 4 MG tablet Take 4 mg by mouth daily.  06/30/10  Yes Historical Provider, MD  hydroxypropyl methylcellulose / hypromellose (ISOPTO TEARS / GONIOVISC) 2.5 % ophthalmic solution Place 1 drop into both eyes as needed for dry eyes.   Yes Historical Provider, MD  levothyroxine (SYNTHROID, LEVOTHROID) 25 MCG tablet Take 25 mcg by mouth daily.   Yes Historical Provider, MD  levothyroxine (SYNTHROID, LEVOTHROID) 75 MCG tablet Take 75 mcg by mouth daily. 02/15/15  Yes Historical Provider, MD  losartan (COZAAR) 25 MG tablet Take 25 mg by mouth daily.   04/29/13  Yes Historical Provider, MD  meloxicam (MOBIC) 7.5 MG tablet Take 1 tablet (7.5 mg total) by mouth daily. 01/19/15  Yes Vickki HearingStanley E Shadd, MD  metFORMIN (GLUCOPHAGE) 500 MG tablet Take 500 mg by mouth 2 (two) times daily with a meal.     Yes Historical Provider, MD  omeprazole (PRILOSEC) 20 MG capsule Take 20 mg by mouth 2 (two) times daily.    Yes Historical Provider, MD  Probiotic Product (PROBIOTIC DAILY PO) Take 1 tablet by mouth daily.   Yes Historical Provider, MD  promethazine (PHENERGAN) 25 MG tablet Take 25 mg  by mouth 2 (two) times daily as needed for nausea.  07/24/10  Yes Historical Provider, MD  sucralfate (CARAFATE) 1 G tablet Take 1 tablet (1 g total) by mouth 4 (four) times daily -  with meals and at bedtime. Prn for indigestion 01/12/15  Yes West Bali, MD  traMADol (ULTRAM) 50 MG tablet Take 50 mg by mouth daily. 01/06/15  Yes Historical Provider, MD  alendronate (FOSAMAX) 70 MG tablet Take 70 mg by mouth every Wednesday. Take with a full glass of water on an empty stomach.    Historical Provider, MD  cephALEXin (KEFLEX) 500 MG capsule Take 1 capsule (500 mg total) by mouth 2 (two) times daily. 11/04/14   Laurence Spates, MD  ciprofloxacin (CIPRO) 500 MG tablet Take 1 tablet (500 mg total) by mouth 2 (two) times daily. One po bid x 7 days 02/20/15   Bethann Berkshire, MD  doxycycline (VIBRAMYCIN) 100 MG capsule Take 1 capsule (100 mg total) by mouth 2 (two) times daily. 04/17/14   Vanetta Mulders, MD  naproxen (NAPROSYN) 500 MG tablet Take 1 tablet (500 mg total) by mouth 2 (two) times daily with a meal. Patient not taking: Reported on 01/12/2015 08/24/14   Vickki Hearing, MD  Vitamin D, Ergocalciferol, (DRISDOL) 50000 UNITS CAPS capsule Take 50,000 Units by mouth every Wednesday.    Historical Provider, MD   BP 125/86 mmHg  Pulse 80  Temp(Src) 98.2 F (36.8 C) (Oral)  Resp 18  Ht 5' (1.524 m)  Wt 220 lb (99.791 kg)  BMI 42.97 kg/m2  SpO2 100% Physical Exam   Constitutional: She is oriented to person, place, and time. She appears well-developed.  HENT:  Head: Normocephalic.  Eyes: Conjunctivae and EOM are normal. No scleral icterus.  Neck: Neck supple. No thyromegaly present.  Cardiovascular: Normal rate and regular rhythm.  Exam reveals no gallop and no friction rub.   No murmur heard. Pulmonary/Chest: No stridor. She has no wheezes. She has no rales. She exhibits no tenderness.  Abdominal: She exhibits no distension. There is no tenderness. There is no rebound.  Musculoskeletal: Normal range of motion. She exhibits no edema.  Lymphadenopathy:    She has no cervical adenopathy.  Neurological: She is oriented to person, place, and time. She exhibits normal muscle tone. Coordination normal.  Skin: No rash noted. No erythema.  Psychiatric: She has a normal mood and affect. Her behavior is normal.    ED Course  Procedures (including critical care time) Labs Review Labs Reviewed  CBC WITH DIFFERENTIAL/PLATELET - Abnormal; Notable for the following:    RBC 3.85 (*)    Hemoglobin 10.4 (*)    HCT 33.0 (*)    All other components within normal limits  COMPREHENSIVE METABOLIC PANEL - Abnormal; Notable for the following:    Creatinine, Ser 1.52 (*)    Calcium 8.0 (*)    GFR calc non Af Amer 32 (*)    GFR calc Af Amer 37 (*)    All other components within normal limits  URINALYSIS, ROUTINE W REFLEX MICROSCOPIC (NOT AT [facility]) - Abnormal; Notable for the following:    APPearance HAZY (*)    Leukocytes, UA SMALL (*)    All other components within normal limits  URINE MICROSCOPIC-ADD ON - Abnormal; Notable for the following:    Squamous Epithelial / LPF 0-5 (*)    Bacteria, UA MANY (*)    All other components within normal limits  URINE CULTURE  BRAIN NATRIURETIC PEPTIDE  I-STAT  TROPOININ, ED    Imaging Review Dg Chest 2 View  02/20/2015  CLINICAL DATA:  Epigastric pain.  Hyperglycemia. EXAM: CHEST  2 VIEW COMPARISON:  06/01/2014  FINDINGS: The heart size and mediastinal contours are within normal limits. Chronic elevation of left hemidiaphragm again noted both lungs are clear. No evidence of pneumothorax or pleural effusion. Neurostimulator leads seen in the thoracic spinal canal. IMPRESSION: Chronic elevation of left hemidiaphragm. No active cardiopulmonary disease. Electronically Signed   By: Myles Rosenthal M.D.   On: 02/20/2015 18:58   I have personally reviewed and evaluated these images and lab results as part of my medical decision-making.   EKG Interpretation   Date/Time:  Saturday February 20 2015 18:31:10 EST Ventricular Rate:  86 PR Interval:  132 QRS Duration: 81 QT Interval:  465 QTC Calculation: 556 R Axis:   36 Text Interpretation:  Sinus rhythm Abnormal R-wave progression, early  transition Borderline T abnormalities, diffuse leads Prolonged QT interval  Confirmed by Farah Lepak  MD, Ysidro Ramsay [phone]) on 02/20/2015 6:33:19 PM      MDM   Final diagnoses:  UTI (lower urinary tract infection)   Patient's labs were unremarkable with a glucose of 78.  Urine shows urinary tract infection. She has been on Keflex since Tuesday for UTI but no culture was done at that time. Patient will do urine culture and will change the antibiotic to Cipro and she'll follow-up with her PCP    Bethann Berkshire, MD 02/20/15 2217

## 2015-02-20 NOTE — ED Notes (Addendum)
Pt here for hyperglycemia and palpitations. Pt states CBG was 385 PTA. Pt also reports hot flashes. CBG in triage is 71.

## 2015-02-20 NOTE — Discharge Instructions (Signed)
Follow up with your md next week.  Stop the keflex

## 2015-02-21 LAB — CBG MONITORING, ED: Glucose-Capillary: 71 mg/dL (ref 65–99)

## 2015-02-25 LAB — URINE CULTURE: Culture: >=100000

## 2015-02-26 ENCOUNTER — Telehealth (HOSPITAL_BASED_OUTPATIENT_CLINIC_OR_DEPARTMENT_OTHER): Payer: Self-pay | Admitting: Emergency Medicine

## 2015-02-26 NOTE — Telephone Encounter (Signed)
Post ED Visit - Positive Culture Follow-up  Culture report reviewed by antimicrobial stewardship pharmacist:  []  Enzo BiNathan Batchelder, Pharm.D. []  Celedonio MiyamotoJeremy Frens, Pharm.D., BCPS []  Garvin FilaMike Maccia, Pharm.D. []  Georgina PillionElizabeth Martin, Pharm.D., BCPS []  Weston LakesMinh Pham, [address]Pharm[address]D., BCPS, AAHIVP []  Estella HuskMichelle Turner, Pharm.D., BCPS, AAHIVP []  Tennis Mustassie Stewart, Pharm.D. [x]  Sherle Poeob Vincent, [address]Pharm[address]  Positive urine culture Klebsiella, E.coli Treated with ciprofloxacin, organism sensitive to the same and no further patient follow-up is required at this time.  Berle MullMiller, Heyli Min 02/26/2015, 11:21 AM

## 2015-03-03 ENCOUNTER — Ambulatory Visit (HOSPITAL_COMMUNITY)
Admission: RE | Admit: 2015-03-03 | Discharge: 2015-03-03 | Disposition: A | Discharge status: Home / Self Care | Payer: Medicare HMO | Source: Ambulatory Visit | Attending: Orthopedic Surgery | Admitting: Orthopedic Surgery

## 2015-03-03 DIAGNOSIS — M75102 Unspecified rotator cuff tear or rupture of left shoulder, not specified as traumatic: Secondary | ICD-10-CM

## 2015-03-19 ENCOUNTER — Ambulatory Visit (HOSPITAL_COMMUNITY)
Admission: RE | Admit: 2015-03-19 | Discharge: 2015-03-19 | Disposition: A | Discharge status: Home / Self Care | Payer: Medicare HMO | Source: Ambulatory Visit | Attending: Orthopedic Surgery | Admitting: Orthopedic Surgery

## 2015-03-19 DIAGNOSIS — R531 Weakness: Secondary | ICD-10-CM | POA: Diagnosis not present

## 2015-03-19 DIAGNOSIS — M19012 Primary osteoarthritis, left shoulder: Secondary | ICD-10-CM | POA: Insufficient documentation

## 2015-03-19 DIAGNOSIS — M75102 Unspecified rotator cuff tear or rupture of left shoulder, not specified as traumatic: Secondary | ICD-10-CM | POA: Diagnosis present

## 2015-03-22 ENCOUNTER — Telehealth: Payer: Self-pay | Admitting: *Deleted

## 2015-03-22 NOTE — Telephone Encounter (Signed)
Patient aware of MRI results   Requesting Injection in shoulder

## 2015-03-23 MED FILL — Sodium Chloride IV Soln 0.9%: INTRAVENOUS | Qty: 500 | Status: AC

## 2015-03-23 NOTE — Telephone Encounter (Signed)
Injection thurs

## 2015-03-23 NOTE — Telephone Encounter (Signed)
Reached patient; appointment scheduled. °

## 2015-03-23 NOTE — Telephone Encounter (Signed)
Please advise and schedule, thanks

## 2015-03-23 NOTE — Telephone Encounter (Signed)
Scheduled appointment accordingly, patient aware.

## 2015-03-25 ENCOUNTER — Encounter: Payer: Self-pay | Admitting: Orthopedic Surgery

## 2015-03-25 ENCOUNTER — Ambulatory Visit (INDEPENDENT_AMBULATORY_CARE_PROVIDER_SITE_OTHER): Payer: Medicare HMO | Admitting: Orthopedic Surgery

## 2015-03-25 VITALS — BP 147/77 | Ht 60.0 in | Wt 220.0 lb

## 2015-03-25 DIAGNOSIS — M129 Arthropathy, unspecified: Secondary | ICD-10-CM

## 2015-03-25 DIAGNOSIS — M75102 Unspecified rotator cuff tear or rupture of left shoulder, not specified as traumatic: Secondary | ICD-10-CM | POA: Diagnosis not present

## 2015-03-25 DIAGNOSIS — M19019 Primary osteoarthritis, unspecified shoulder: Secondary | ICD-10-CM

## 2015-03-25 NOTE — Progress Notes (Signed)
Chief Complaint  Patient presents with  . Injections    left shoulder injection    BP 147/77 mmHg  Ht 5' (1.524 m)  Wt 220 lb (99.791 kg)  BMI 42.97 kg/m2  The patient had MRI which showed arthritis of the shoulder joint labral degeneration degenerative labral tear posteriorly partial intrasubstance insertional tearing of the rotator cuff near the supraspinatus and infraspinatus without full-thickness tear and some bicipital tendinosis  I suggested injection and physical therapy which she will do on her own. We are quite confident that she is compliant with home exercises as she rehabbed her right rotator cuff repair on her own with home therapy  Glenohumeral arthritis with rotator cuff syndrome  Inject left subacromial space Codman exercises with Follow-up in 2 months   Procedure note the subacromial injection shoulder left   Verbal consent was obtained to inject the  Left   Shoulder  Timeout was completed to confirm the injection site is a subacromial space of the  left  shoulder  Medication used Depo-Medrol 40 mg and lidocaine 1% 3 cc  Anesthesia was provided by ethyl chloride  The injection was performed in the left  posterior subacromial space. After pinning the skin with alcohol and anesthetized the skin with ethyl chloride the subacromial space was injected using a 20-gauge needle. There were no complications  Sterile dressing was applied.

## 2015-04-26 ENCOUNTER — Telehealth: Payer: Self-pay | Admitting: Gastroenterology

## 2015-04-26 NOTE — Telephone Encounter (Signed)
Pt called this afternoon wanting to speak with SF or her PA. I told her SF and the PA were not in the office. She said that her stomach hurts and her diarrhea was green and has been doing this since last Thursday. I offered her OV with SF on 1/26 at 9, but she would not take it. She wanted to speak with the nurse instead. Please call her back at [phone]

## 2015-04-28 NOTE — Telephone Encounter (Signed)
LMOM to call.

## 2015-04-30 ENCOUNTER — Ambulatory Visit (HOSPITAL_COMMUNITY)
Admission: RE | Admit: 2015-04-30 | Discharge: 2015-04-30 | Disposition: A | Discharge status: Home / Self Care | Payer: Medicare HMO | Source: Ambulatory Visit | Attending: Family Medicine | Admitting: Family Medicine

## 2015-04-30 ENCOUNTER — Other Ambulatory Visit (HOSPITAL_COMMUNITY): Payer: Self-pay | Admitting: Family Medicine

## 2015-04-30 DIAGNOSIS — M1712 Unilateral primary osteoarthritis, left knee: Secondary | ICD-10-CM | POA: Diagnosis not present

## 2015-04-30 DIAGNOSIS — M25562 Pain in left knee: Secondary | ICD-10-CM

## 2015-04-30 NOTE — Telephone Encounter (Signed)
LMOM to call.

## 2015-05-27 ENCOUNTER — Ambulatory Visit: Payer: Medicare HMO | Admitting: Orthopedic Surgery

## 2015-05-31 ENCOUNTER — Ambulatory Visit: Payer: Medicare HMO | Admitting: Orthopedic Surgery

## 2015-05-31 ENCOUNTER — Encounter: Payer: Self-pay | Admitting: *Deleted

## 2015-06-11 ENCOUNTER — Other Ambulatory Visit: Payer: Self-pay | Admitting: Gastroenterology

## 2015-06-22 ENCOUNTER — Encounter: Payer: Self-pay | Admitting: Gastroenterology

## 2015-06-26 ENCOUNTER — Emergency Department (HOSPITAL_COMMUNITY)
Admission: EM | Admit: 2015-06-26 | Discharge: 2015-06-26 | Disposition: A | Discharge status: Home / Self Care | Payer: Medicare HMO | Attending: Emergency Medicine | Admitting: Emergency Medicine

## 2015-06-26 ENCOUNTER — Encounter (HOSPITAL_COMMUNITY): Payer: Self-pay | Admitting: *Deleted

## 2015-06-26 ENCOUNTER — Emergency Department (HOSPITAL_COMMUNITY): Payer: Medicare HMO

## 2015-06-26 DIAGNOSIS — I639 Cerebral infarction, unspecified: Secondary | ICD-10-CM | POA: Diagnosis not present

## 2015-06-26 DIAGNOSIS — J209 Acute bronchitis, unspecified: Secondary | ICD-10-CM | POA: Diagnosis not present

## 2015-06-26 DIAGNOSIS — Z79899 Other long term (current) drug therapy: Secondary | ICD-10-CM | POA: Diagnosis not present

## 2015-06-26 DIAGNOSIS — Z87891 Personal history of nicotine dependence: Secondary | ICD-10-CM | POA: Diagnosis not present

## 2015-06-26 DIAGNOSIS — Z7984 Long term (current) use of oral hypoglycemic drugs: Secondary | ICD-10-CM | POA: Diagnosis not present

## 2015-06-26 DIAGNOSIS — N183 Chronic kidney disease, stage 3 (moderate): Secondary | ICD-10-CM | POA: Diagnosis not present

## 2015-06-26 DIAGNOSIS — R05 Cough: Secondary | ICD-10-CM

## 2015-06-26 DIAGNOSIS — F329 Major depressive disorder, single episode, unspecified: Secondary | ICD-10-CM | POA: Diagnosis not present

## 2015-06-26 DIAGNOSIS — R059 Cough, unspecified: Secondary | ICD-10-CM

## 2015-06-26 DIAGNOSIS — E119 Type 2 diabetes mellitus without complications: Secondary | ICD-10-CM | POA: Diagnosis not present

## 2015-06-26 DIAGNOSIS — M810 Age-related osteoporosis without current pathological fracture: Secondary | ICD-10-CM | POA: Diagnosis not present

## 2015-06-26 DIAGNOSIS — I129 Hypertensive chronic kidney disease with stage 1 through stage 4 chronic kidney disease, or unspecified chronic kidney disease: Secondary | ICD-10-CM | POA: Insufficient documentation

## 2015-06-26 DIAGNOSIS — J45909 Unspecified asthma, uncomplicated: Secondary | ICD-10-CM | POA: Insufficient documentation

## 2015-06-26 LAB — CBC WITH DIFFERENTIAL/PLATELET
BASOS PCT: 0 %
Basophils Absolute: 0 10*3/uL (ref 0.0–0.1)
EOS ABS: 0.3 10*3/uL (ref 0.0–0.7)
EOS PCT: 4 %
HCT: 34.5 % — ABNORMAL LOW (ref 36.0–46.0)
HEMOGLOBIN: 11.2 g/dL — AB (ref 12.0–15.0)
LYMPHS ABS: 2.4 10*3/uL (ref 0.7–4.0)
Lymphocytes Relative: 26 %
MCH: 27.3 pg (ref 26.0–34.0)
MCHC: 32.5 g/dL (ref 30.0–36.0)
MCV: 84.1 fL (ref 78.0–100.0)
MONOS PCT: 6 %
Monocytes Absolute: 0.6 10*3/uL (ref 0.1–1.0)
NEUTROS PCT: 64 %
Neutro Abs: 5.9 10*3/uL (ref 1.7–7.7)
PLATELETS: 257 10*3/uL (ref 150–400)
RBC: 4.1 MIL/uL (ref 3.87–5.11)
RDW: 15.8 % — AB (ref 11.5–15.5)
WBC: 9.2 10*3/uL (ref 4.0–10.5)

## 2015-06-26 LAB — BASIC METABOLIC PANEL
Anion gap: 8 (ref 5–15)
BUN: 12 mg/dL (ref 6–20)
CALCIUM: 8.7 mg/dL — AB (ref 8.9–10.3)
CHLORIDE: 108 mmol/L (ref 101–111)
CO2: 24 mmol/L (ref 22–32)
CREATININE: 1.41 mg/dL — AB (ref 0.44–1.00)
GFR, EST AFRICAN AMERICAN: 41 mL/min — AB (ref >60)
GFR, EST NON AFRICAN AMERICAN: 35 mL/min — AB (ref >60)
Glucose, Bld: 109 mg/dL — ABNORMAL HIGH (ref 65–99)
Potassium: 4.1 mmol/L (ref 3.5–5.1)
SODIUM: 140 mmol/L (ref 135–145)

## 2015-06-26 MED ORDER — BENZONATATE 100 MG PO CAPS
100.0000 mg | ORAL_CAPSULE | Freq: Once | ORAL | Status: AC
  Administered 2015-06-26: 100 mg via ORAL
  Filled 2015-06-26: qty 1

## 2015-06-26 MED ORDER — GUAIFENESIN-CODEINE 100-10 MG/5ML PO SYRP
5.0000 mL | ORAL_SOLUTION | Freq: Three times a day (TID) | ORAL | Status: DC | PRN
Start: 2015-06-26 — End: 2015-09-22

## 2015-06-26 MED ORDER — BENZONATATE 100 MG PO CAPS: 100.0000 mg | ORAL_CAPSULE | Freq: Three times a day (TID) | ORAL | Status: DC

## 2015-06-26 MED ORDER — ALBUTEROL SULFATE (2.5 MG/3ML) 0.083% IN NEBU: 2.5000 mg | INHALATION_SOLUTION | Freq: Four times a day (QID) | RESPIRATORY_TRACT | Status: DC | PRN

## 2015-06-26 NOTE — ED Notes (Addendum)
Pt. C/o body aches, fever, diarrhea x 2 days. Denies vomiting. Pt reports cough as well.

## 2015-06-26 NOTE — Discharge Instructions (Signed)
Acute Bronchitis °Bronchitis is inflammation of the airways that extend from the windpipe into the lungs (bronchi). The inflammation often causes mucus to develop. This leads to a cough, which is the most common symptom of bronchitis.  °In acute bronchitis, the condition usually develops suddenly and goes away over time, usually in a couple weeks. Smoking, allergies, and asthma can make bronchitis worse. Repeated episodes of bronchitis may cause further lung problems.  °CAUSES °Acute bronchitis is most often caused by the same virus that causes a cold. The virus can spread from person to person (contagious) through coughing, sneezing, and touching contaminated objects. °SIGNS AND SYMPTOMS  °· Cough.   °· Fever.   °· Coughing up mucus.   °· Body aches.   °· Chest congestion.   °· Chills.   °· Shortness of breath.   °· Sore throat.   °DIAGNOSIS  °Acute bronchitis is usually diagnosed through a physical exam. Your health care provider will also ask you questions about your medical history. Tests, such as chest X-rays, are sometimes done to rule out other conditions.  °TREATMENT  °Acute bronchitis usually goes away in a couple weeks. Oftentimes, no medical treatment is necessary. Medicines are sometimes given for relief of fever or cough. Antibiotic medicines are usually not needed but may be prescribed in certain situations. In some cases, an inhaler may be recommended to help reduce shortness of breath and control the cough. A cool mist vaporizer may also be used to help thin bronchial secretions and make it easier to clear the chest.  °HOME CARE INSTRUCTIONS °· Get plenty of rest.   °· Drink enough fluids to keep your urine clear or pale yellow (unless you have a medical condition that requires fluid restriction). Increasing fluids may help thin your respiratory secretions (sputum) and reduce chest congestion, and it will prevent dehydration.   °· Take medicines only as directed by your health care provider. °· If  you were prescribed an antibiotic medicine, finish it all even if you start to feel better. °· Avoid smoking and secondhand smoke. Exposure to cigarette smoke or irritating chemicals will make bronchitis worse. If you are a smoker, consider using nicotine gum or skin patches to help control withdrawal symptoms. Quitting smoking will help your lungs heal faster.   °· Reduce the chances of another bout of acute bronchitis by washing your hands frequently, avoiding people with cold symptoms, and trying not to touch your hands to your mouth, nose, or eyes.   °· Keep all follow-up visits as directed by your health care provider.   °SEEK MEDICAL CARE IF: °Your symptoms do not improve after 1 week of treatment.  °SEEK IMMEDIATE MEDICAL CARE IF: °· You develop an increased fever or chills.   °· You have chest pain.   °· You have severe shortness of breath. °· You have bloody sputum.   °· You develop dehydration. °· You faint or repeatedly feel like you are going to pass out. °· You develop repeated vomiting. °· You develop a severe headache. °MAKE SURE YOU:  °· Understand these instructions. °· Will watch your condition. °· Will get help right away if you are not doing well or get worse. °  °This information is not intended to replace advice given to you by your health care provider. Make sure you discuss any questions you have with your health care provider. °  °Document Released: 04/27/2004 Document Revised: 04/10/2014 Document Reviewed: 09/10/2012 °Elsevier Interactive Patient Education ©2016 [employer] ° °Cough, Adult °Coughing is a reflex that clears your throat and your airways. Coughing helps to heal and   protect your lungs. It is normal to cough occasionally, but a cough that happens with other symptoms or lasts a long time may be a sign of a condition that needs treatment. A cough may last only 2-3 weeks (acute), or it may last longer than 8 weeks (chronic). °CAUSES °Coughing is commonly caused by: °· Breathing  in substances that irritate your lungs. °· A viral or bacterial respiratory infection. °· Allergies. °· Asthma. °· Postnasal drip. °· Smoking. °· Acid backing up from the stomach into the esophagus (gastroesophageal reflux). °· Certain medicines. °· Chronic lung problems, including COPD (or rarely, lung cancer). °· Other medical conditions such as heart failure. °HOME CARE INSTRUCTIONS  °Pay attention to any changes in your symptoms. Take these actions to help with your discomfort: °· Take medicines only as told by your health care provider. °¨ If you were prescribed an antibiotic medicine, take it as told by your health care provider. Do not stop taking the antibiotic even if you start to feel better. °¨ Talk with your health care provider before you take a cough suppressant medicine. °· Drink enough fluid to keep your urine clear or pale yellow. °· If the air is dry, use a cold steam vaporizer or humidifier in your bedroom or your home to help loosen secretions. °· Avoid anything that causes you to cough at work or at home. °· If your cough is worse at night, try sleeping in a semi-upright position. °· Avoid cigarette smoke. If you smoke, quit smoking. If you need help quitting, ask your health care provider. °· Avoid caffeine. °· Avoid alcohol. °· Rest as needed. °SEEK MEDICAL CARE IF:  °· You have new symptoms. °· You cough up pus. °· Your cough does not get better after 2-3 weeks, or your cough gets worse. °· You cannot control your cough with suppressant medicines and you are losing sleep. °· You develop pain that is getting worse or pain that is not controlled with pain medicines. °· You have a fever. °· You have unexplained weight loss. °· You have night sweats. °SEEK IMMEDIATE MEDICAL CARE IF: °· You cough up blood. °· You have difficulty breathing. °· Your heartbeat is very fast. °  °This information is not intended to replace advice given to you by your health care provider. Make sure you discuss any  questions you have with your health care provider. °  °Document Released: 09/16/2010 Document Revised: 12/09/2014 Document Reviewed: 05/27/2014 °Elsevier Interactive Patient Education ©2016 [employer] ° °

## 2015-06-26 NOTE — ED Provider Notes (Signed)
CSN: [id number]     Arrival date & time 06/26/15  1459 History   First MD Initiated Contact with Patient 06/26/15 1621     Chief Complaint  Patient presents with  . Generalized Body Aches      HPI  [name]  presents for evaluation of a cough. She's been symptomatic for 5 days. States "I'm sure about the flu". She did receive a flu shot. Has had Pneumovax and Prevnar. Cough during the week. Out of her albuterol at home. Has body aches. Is not documented fever. Diarrhea yesterday. No vomiting or diarrhea today. Sugars under control. No other symptoms. No chest pain.  Past Medical History  Diagnosis Date  . Asthma   . Depression   . Type 2 diabetes mellitus with diabetic neuropathy (HCC)   . GERD (gastroesophageal reflux disease)   . Essential hypertension, benign   . Low back pain   . IBS (irritable bowel syndrome)   . History of recurrent UTIs   . Cataract   . Glaucoma   . Arthritis   . Hx of migraines   . Carpal tunnel syndrome   . Morbid obesity (HCC)   . Sleep apnea     Intolerant to CPAP   . Hx of colonic polyps   . Anxiety   . Carotid artery aneurysm [facility](HCC)     Right s/p endovascular treatment 2004  . Neurogenic bladder   . Osteoporosis   . Hypothyroidism   . Stroke (HCC)   . CKD (chronic kidney disease) stage 3, GFR 30-59 ml/min    Past Surgical History  Procedure Laterality Date  . Vesicovaginal fistula closure w/ tah    . Carpal tunnel release - bilateral  1992  . Total hip replacement - right  2002  . Stomach surgery gastropexy for gastric volvulus  2009  . Tubular adenoma    . Shoulder surgery      Rotator cuff repair, 2012  . Spinal cord stimulator insertion    . Esophagogastroduodenoscopy  08/12/2007    NWG:NFAOZHSLF:Normal esophagus without evidence of Barrett's,mass, erosion ulceration or stricture.  The GE junction was at 40 cm.  The Bravo  capsule was placed at 34 cm/ Unable to appreciate a hiatal hernia in the retroflexed viewNormal duodenal bulb and  second portion of the duodenum. mild gastritis.   . Colonoscopy N/A 03/04/2013    Procedure: COLONOSCOPY;  Surgeon: West BaliSandi L Fields, MD;  Location: [facility];  Service: Endoscopy;  Laterality: N/A;  11:30-moved to 12:35 Soledad GerlachLeigh Ann to notify pt  . Abdominal hysterectomy    . Colonoscopy  06/14/2006    YQM:VHQIONSLF:SIMPLE ADENOMAS(2), pTICS, [place] lipoma  . Cerebral aneurysm repair  2004    Coil    Family History  Problem Relation Age of Onset  . Heart disease Mother   . Cancer Mother   . Diabetes Brother   . Cancer Brother     Prostate   . Cancer Brother     Pancreatic   . Cancer Sister     Breast   . Heart attack Sister   . Lung disease    . Asthma    . Colon cancer Neg Hx   . Colon polyps Neg Hx    Social History  Substance Use Topics  . Smoking status: Former Smoker    Types: Cigarettes  . Smokeless tobacco: Never Used  . Alcohol Use: No   OB History    Gravida Para Term Preterm AB TAB SAB Ectopic Multiple  Living            6     Review of Systems  Constitutional: Negative for fever, chills, diaphoresis, appetite change and fatigue.  HENT: Negative for mouth sores, sore throat and trouble swallowing.   Eyes: Negative for visual disturbance.  Respiratory: Positive for cough. Negative for chest tightness, shortness of breath and wheezing.   Cardiovascular: Negative for chest pain.  Gastrointestinal: Positive for diarrhea. Negative for nausea, vomiting, abdominal pain and abdominal distention.  Endocrine: Negative for polydipsia, polyphagia and polyuria.  Genitourinary: Negative for dysuria, frequency and hematuria.  Musculoskeletal: Positive for myalgias. Negative for gait problem.  Skin: Negative for color change, pallor and rash.  Neurological: Negative for dizziness, syncope, light-headedness and headaches.  Hematological: Does not bruise/bleed easily.  Psychiatric/Behavioral: Negative for behavioral problems and confusion.      Allergies  Hydrocodone and  Naproxen  Home Medications   Prior to Admission medications   Medication Sig Start Date End Date Taking? Authorizing Provider  amLODipine (NORVASC) 10 MG tablet Take 10 mg by mouth daily. 02/05/14  Yes Historical Provider, MD  Cholecalciferol (VITAMIN D3) 5000 UNITS CAPS Take 5,000 Units by mouth daily.   Yes Historical Provider, MD  colchicine 0.6 MG tablet Take 1 tablet (0.6 mg total) by mouth every 6 (six) hours. Patient taking differently: Take 0.6 mg by mouth daily.  08/24/14  Yes Vickki Hearing, MD  Cyanocobalamin (VITAMIN B 12 PO) Take 1 tablet by mouth daily.   Yes Historical Provider, MD  dextromethorphan-guaiFENesin (MUCINEX DM) 30-600 MG 12hr tablet Take 1 tablet by mouth 2 (two) times daily.   Yes Historical Provider, MD  dicyclomine (BENTYL) 10 MG capsule TAKE 1 CAPSULE BY MOUTH BEFORE MEALS AND AT BEDTIME AS NEEDED FOR ABDOMINAL CRAMPS AND DIARRHEA. 06/13/15  Yes Tiffany Kocher, PA-C  doxepin (SINEQUAN) 25 MG capsule Take 75 mg by mouth at bedtime.  06/30/10  Yes Historical Provider, MD  doxycycline (VIBRAMYCIN) 100 MG capsule Take 1 capsule (100 mg total) by mouth 2 (two) times daily. 04/17/14  Yes Vanetta Mulders, MD  furosemide (LASIX) 20 MG tablet Take 20 mg by mouth daily as needed for fluid.    Yes Historical Provider, MD  gabapentin (NEURONTIN) 100 MG capsule Take 100 mg by mouth 2 (two) times daily as needed (pain).   Yes Historical Provider, MD  glimepiride (AMARYL) 4 MG tablet Take 4 mg by mouth daily.  06/30/10  Yes Historical Provider, MD  guaiFENesin-dextromethorphan (ROBITUSSIN DM) 100-10 MG/5ML syrup Take 5 mLs by mouth every 4 (four) hours as needed for cough.   Yes Historical Provider, MD  levothyroxine (SYNTHROID, LEVOTHROID) 25 MCG tablet Take 25 mcg by mouth daily.   Yes Historical Provider, MD  levothyroxine (SYNTHROID, LEVOTHROID) 75 MCG tablet Take 75 mcg by mouth daily. 06/11/15  Yes Historical Provider, MD  losartan (COZAAR) 25 MG tablet Take 25 mg by mouth  daily.  04/29/13  Yes Historical Provider, MD  meloxicam (MOBIC) 7.5 MG tablet Take 1 tablet (7.5 mg total) by mouth daily. 01/19/15  Yes Vickki Hearing, MD  metFORMIN (GLUCOPHAGE) 500 MG tablet Take 1,000 mg by mouth 2 (two) times daily with a meal.    Yes Historical Provider, MD  naproxen (NAPROSYN) 500 MG tablet Take 1 tablet (500 mg total) by mouth 2 (two) times daily with a meal. 08/24/14  Yes Vickki Hearing, MD  omeprazole (PRILOSEC) 20 MG capsule Take 20 mg by mouth 2 (two) times daily.  Yes Historical Provider, MD  Probiotic Product (PROBIOTIC DAILY PO) Take 1 tablet by mouth daily.   Yes Historical Provider, MD  promethazine (PHENERGAN) 25 MG tablet Take 25 mg by mouth 2 (two) times daily as needed for nausea.  07/24/10  Yes Historical Provider, MD  sucralfate (CARAFATE) 1 G tablet Take 1 tablet (1 g total) by mouth 4 (four) times daily -  with meals and at bedtime. Prn for indigestion Patient taking differently: Take 1 g by mouth 2 (two) times daily. Prn for indigestion 01/12/15  Yes West Bali, MD  traMADol (ULTRAM) 50 MG tablet Take 50 mg by mouth daily as needed for moderate pain.    Yes Historical Provider, MD  albuterol (PROVENTIL) (2.5 MG/3ML) 0.083% nebulizer solution Take 3 mLs (2.5 mg total) by nebulization every 6 (six) hours as needed for wheezing or shortness of breath. 06/26/15   Rolland Porter, MD  alendronate (FOSAMAX) 70 MG tablet Take 70 mg by mouth every Wednesday. Take with a full glass of water on an empty stomach.    Historical Provider, MD  benzonatate (TESSALON) 100 MG capsule Take 1 capsule (100 mg total) by mouth every 8 (eight) hours. 06/26/15   Rolland Porter, MD  cephALEXin (KEFLEX) 500 MG capsule Take 1 capsule (500 mg total) by mouth 2 (two) times daily. 11/04/14   Laurence Spates, MD  ciprofloxacin (CIPRO) 500 MG tablet Take 1 tablet (500 mg total) by mouth 2 (two) times daily. One po bid x 7 days 02/20/15   Bethann Berkshire, MD  guaiFENesin-codeine  [facility]) 100-10 MG/5ML syrup Take 5 mLs by mouth 3 (three) times daily as needed for cough. 06/26/15   Rolland Porter, MD  Vitamin D, Ergocalciferol, (DRISDOL) 50000 UNITS CAPS capsule Take 50,000 Units by mouth every Wednesday.    Historical Provider, MD   BP 153/122 mmHg  Pulse 94  Temp(Src) 98.8 F (37.1 C) (Oral)  Resp 22  Ht 5' (1.524 m)  Wt 232 lb (105.235 kg)  BMI 45.31 kg/m2  SpO2 99% Physical Exam  Constitutional: She is oriented to person, place, and time. She appears well-developed and well-nourished. No distress.  HENT:  Head: Normocephalic.  Eyes: Conjunctivae are normal. Pupils are equal, round, and reactive to light. No scleral icterus.  Neck: Normal range of motion. Neck supple. No thyromegaly present.  Cardiovascular: Normal rate and regular rhythm.  Exam reveals no gallop and no friction rub.   No murmur heard. Pulmonary/Chest: Effort normal and breath sounds normal. No respiratory distress. She has no wheezes. She has no rales.  Frequent Cough. Clear bilateral breath sounds. No increased worker breathing.  Abdominal: Soft. Bowel sounds are normal. She exhibits no distension. There is no tenderness. There is no rebound.  Musculoskeletal: Normal range of motion.  Neurological: She is alert and oriented to person, place, and time.  Skin: Skin is warm and dry. No rash noted.  Psychiatric: She has a normal mood and affect. Her behavior is normal.    ED Course  Procedures (including critical care time) Labs Review Labs Reviewed  CBC WITH DIFFERENTIAL/PLATELET - Abnormal; Notable for the following:    Hemoglobin 11.2 (*)    HCT 34.5 (*)    RDW 15.8 (*)    All other components within normal limits  BASIC METABOLIC PANEL - Abnormal; Notable for the following:    Glucose, Bld 109 (*)    Creatinine, Ser 1.41 (*)    Calcium 8.7 (*)    GFR calc non Af  Amer 35 (*)    GFR calc Af Amer 41 (*)    All other components within normal limits    Imaging Review Dg Chest  2 View  06/26/2015  CLINICAL DATA:  COUGH, Pt. C/o body aches, fever, diarrhea x 2 days. Denies vomiting. Pt reports cough as well. HISTORY OF ASTHMA, DM, HTN, SPINAL CORD STIMULATOR caps EXAM: CHEST  2 VIEW COMPARISON:  Radiograph 02/20/2015, CT 04/30/2007 FINDINGS: Normal cardiac silhouette. Chronic elevation of the LEFT hemidiaphragm. No evidence of effusion, infiltrate, or pneumothorax. Spinal stimulation electrodesin the spinal canal of the thoracic spine. IMPRESSION: No acute cardiopulmonary process. Chronic elevation LEFT hemidiaphragm Electronically Signed   By: Genevive Bi M.D.   On: 06/26/2015 16:19   I have personally reviewed and evaluated these images and lab results as part of my medical decision-making.   EKG Interpretation None      MDM   Final diagnoses:  Acute bronchitis, unspecified organism  Cough   Patient with clear lungs. Satting 100%. Afebrile. Normal chest x-ray. Does not appear septic or toxic. Given Tessalon. Given prescription for Cheratussin, Tessalon, refill on her albuterol. Primary care follow-up. ER with acute changes.    Rolland Porter, MD 06/26/15 [phone]

## 2015-06-26 NOTE — ED Notes (Signed)
Pt reports cough for 3 days.  States out of albuterol and needs refill

## 2015-08-09 ENCOUNTER — Encounter: Payer: Self-pay | Admitting: Pain Medicine

## 2015-08-09 ENCOUNTER — Ambulatory Visit: Payer: Medicare HMO | Attending: Pain Medicine | Admitting: Pain Medicine

## 2015-08-09 VITALS — BP 111/94 | HR 81 | Temp 97.9°F | Resp 16 | Ht 60.0 in | Wt 232.0 lb

## 2015-08-09 DIAGNOSIS — M25569 Pain in unspecified knee: Secondary | ICD-10-CM | POA: Diagnosis present

## 2015-08-09 DIAGNOSIS — E1140 Type 2 diabetes mellitus with diabetic neuropathy, unspecified: Secondary | ICD-10-CM | POA: Insufficient documentation

## 2015-08-09 DIAGNOSIS — M170 Bilateral primary osteoarthritis of knee: Secondary | ICD-10-CM | POA: Diagnosis not present

## 2015-08-09 DIAGNOSIS — K449 Diaphragmatic hernia without obstruction or gangrene: Secondary | ICD-10-CM | POA: Diagnosis not present

## 2015-08-09 DIAGNOSIS — Z8673 Personal history of transient ischemic attack (TIA), and cerebral infarction without residual deficits: Secondary | ICD-10-CM | POA: Diagnosis not present

## 2015-08-09 DIAGNOSIS — M25561 Pain in right knee: Secondary | ICD-10-CM | POA: Diagnosis not present

## 2015-08-09 DIAGNOSIS — M79673 Pain in unspecified foot: Secondary | ICD-10-CM | POA: Diagnosis present

## 2015-08-09 DIAGNOSIS — Z794 Long term (current) use of insulin: Secondary | ICD-10-CM | POA: Diagnosis not present

## 2015-08-09 DIAGNOSIS — H269 Unspecified cataract: Secondary | ICD-10-CM | POA: Diagnosis not present

## 2015-08-09 DIAGNOSIS — G8929 Other chronic pain: Secondary | ICD-10-CM | POA: Diagnosis not present

## 2015-08-09 DIAGNOSIS — M25562 Pain in left knee: Secondary | ICD-10-CM | POA: Diagnosis not present

## 2015-08-09 DIAGNOSIS — Z6841 Body Mass Index (BMI) 40.0 and over, adult: Secondary | ICD-10-CM | POA: Diagnosis not present

## 2015-08-09 DIAGNOSIS — E039 Hypothyroidism, unspecified: Secondary | ICD-10-CM | POA: Insufficient documentation

## 2015-08-09 DIAGNOSIS — Z96641 Presence of right artificial hip joint: Secondary | ICD-10-CM | POA: Insufficient documentation

## 2015-08-09 DIAGNOSIS — J45901 Unspecified asthma with (acute) exacerbation: Secondary | ICD-10-CM | POA: Diagnosis not present

## 2015-08-09 DIAGNOSIS — G43909 Migraine, unspecified, not intractable, without status migrainosus: Secondary | ICD-10-CM | POA: Insufficient documentation

## 2015-08-09 DIAGNOSIS — F329 Major depressive disorder, single episode, unspecified: Secondary | ICD-10-CM | POA: Insufficient documentation

## 2015-08-09 DIAGNOSIS — M549 Dorsalgia, unspecified: Secondary | ICD-10-CM | POA: Diagnosis present

## 2015-08-09 DIAGNOSIS — M5137 Other intervertebral disc degeneration, lumbosacral region: Secondary | ICD-10-CM | POA: Diagnosis not present

## 2015-08-09 DIAGNOSIS — F419 Anxiety disorder, unspecified: Secondary | ICD-10-CM | POA: Diagnosis not present

## 2015-08-09 DIAGNOSIS — G4730 Sleep apnea, unspecified: Secondary | ICD-10-CM | POA: Insufficient documentation

## 2015-08-09 DIAGNOSIS — G4700 Insomnia, unspecified: Secondary | ICD-10-CM | POA: Insufficient documentation

## 2015-08-09 DIAGNOSIS — I493 Ventricular premature depolarization: Secondary | ICD-10-CM | POA: Diagnosis not present

## 2015-08-09 DIAGNOSIS — N3281 Overactive bladder: Secondary | ICD-10-CM | POA: Insufficient documentation

## 2015-08-09 DIAGNOSIS — K219 Gastro-esophageal reflux disease without esophagitis: Secondary | ICD-10-CM | POA: Insufficient documentation

## 2015-08-09 DIAGNOSIS — I129 Hypertensive chronic kidney disease with stage 1 through stage 4 chronic kidney disease, or unspecified chronic kidney disease: Secondary | ICD-10-CM | POA: Insufficient documentation

## 2015-08-09 DIAGNOSIS — Z87891 Personal history of nicotine dependence: Secondary | ICD-10-CM | POA: Insufficient documentation

## 2015-08-09 DIAGNOSIS — E119 Type 2 diabetes mellitus without complications: Secondary | ICD-10-CM | POA: Diagnosis not present

## 2015-08-09 DIAGNOSIS — R112 Nausea with vomiting, unspecified: Secondary | ICD-10-CM | POA: Insufficient documentation

## 2015-08-09 DIAGNOSIS — K589 Irritable bowel syndrome without diarrhea: Secondary | ICD-10-CM | POA: Diagnosis not present

## 2015-08-09 DIAGNOSIS — Z86010 Personal history of colonic polyps: Secondary | ICD-10-CM | POA: Insufficient documentation

## 2015-08-09 DIAGNOSIS — Z9689 Presence of other specified functional implants: Secondary | ICD-10-CM | POA: Diagnosis not present

## 2015-08-09 DIAGNOSIS — H409 Unspecified glaucoma: Secondary | ICD-10-CM | POA: Insufficient documentation

## 2015-08-09 NOTE — Progress Notes (Signed)
Safety precautions to be maintained throughout the outpatient stay will include: orient to surroundings, keep bed in low position, maintain call bell within reach at all times, provide assistance with transfer out of bed and ambulation. Patient was seen at [facility] last year, is here today for her pump stimulator.Patient did not call [employer] to tell them about her appointment here with Dr Laban EmperorNaveira.

## 2015-08-09 NOTE — Patient Instructions (Signed)
Pain Management Discharge Instructions  General Discharge Instructions :  If you need to reach your doctor call: Monday-Friday 8:00 am - 4:00 pm at [phone] or toll free [phone].  After clinic hours [phone] to have operator reach doctor.  Bring all of your medication bottles to all your appointments in the pain clinic.  To cancel or reschedule your appointment with Pain Management please remember to call 24 hours in advance to avoid a fee.  Refer to the educational materials which you have been given on: General Risks, I had my Procedure. Discharge Instructions, Post Sedation.  Post Procedure Instructions:  The drugs you were given will stay in your system until tomorrow, so for the next 24 hours you should not drive, make any legal decisions or drink any alcoholic beverages.  You may eat anything you prefer, but it is better to start with liquids then soups and crackers, and gradually work up to solid foods.  Please notify your doctor immediately if you have any unusual bleeding, trouble breathing or pain that is not related to your normal pain.  Depending on the type of procedure that was done, some parts of your body may feel week and/or numb.  This usually clears up by tonight or the next day.  Walk with the use of an assistive device or accompanied by an adult for the 24 hours.  You may use ice on the affected area for the first 24 hours.  Put ice in a Ziploc bag and cover with a towel and place against area 15 minutes on 15 minutes off.  You may switch to heat after 24 hours.Trigger Point Injection Trigger points are areas where you have muscle pain. A trigger point injection is a shot given in the trigger point to relieve that pain. A trigger point might feel like a knot in your muscle. It hurts to press on a trigger point. Sometimes the pain spreads out (radiates) to other parts of the body. For example, pressing on a trigger point in your shoulder might cause pain in  your arm or neck. You might have one trigger point. Or, you might have more than one. People often have trigger points in their upper back and lower back. They also occur often in the neck and shoulders. Pain from a trigger point lasts for a long time. It can make it hard to keep moving. You might not be able to do the exercise or physical therapy that could help you deal with the pain. A trigger point injection may help. It does not work for everyone. But, it may relieve your pain for a few days or a few months. A trigger point injection does not cure long-lasting (chronic) pain. LET YOUR CAREGIVER KNOW ABOUT:  Any allergies (especially to latex, lidocaine, or steroids).  Blood-thinning medicines that you take. These drugs can lead to bleeding or bruising after an injection. They include:  Aspirin.  Ibuprofen.  Clopidogrel.  Warfarin.  Other medicines you take. This includes all vitamins, herbs, eyedrops, over-the-counter medicines, and creams.  Use of steroids.  Recent infections.  Past problems with numbing medicines.  Bleeding problems.  Surgeries you have had.  Other health problems. RISKS AND COMPLICATIONS A trigger point injection is a safe treatment. However, problems may develop, such as:  Minor side effects usually go away in 1 to 2 days. These may include:  Soreness.  Bruising.  Stiffness.  More serious problems are rare. But, they may include:  Bleeding under the skin (hematoma).    Skin infection.  Breaking off of the needle under your skin.  Lung puncture.  The trigger point injection may not work for you. BEFORE THE PROCEDURE You may need to stop taking any medicine that thins your blood. This is to prevent bleeding and bruising. Usually these medicines are stopped several days before the injection. No other preparation is needed. PROCEDURE  A trigger point injection can be given in your caregiver's office or in a clinic. Each injection takes 2  minutes or less.  Your caregiver will feel for trigger points. The caregiver may use a marker to circle the area for the injection.  The skin over the trigger point will be washed with a germ-killing (antiseptic) solution.  The caregiver pinches the spot for the injection.  Then, a very thin needle is used for the shot. You may feel pain or a twitching feeling when the needle enters the trigger point.  A numbing solution may be injected into the trigger point. Sometimes a drug to keep down swelling, redness, and warmth (inflammation) is also injected.  Your caregiver moves the needle around the trigger zone until the tightness and twitching goes away.  After the injection, your caregiver may put gentle pressure over the injection site.  Then it is covered with a bandage. AFTER THE PROCEDURE  You can go right home after the injection.  The bandage can be taken off after a few hours.  You may feel sore and stiff for 1 to 2 days.  Go back to your regular activities slowly. Your caregiver may ask you to stretch your muscles. Do not do anything that takes extra energy for a few days.  Follow your caregiver's instructions to manage and treat other pain.   This information is not intended to replace advice given to you by your health care provider. Make sure you discuss any questions you have with your health care provider.   Document Released: 03/09/2011 Document Revised: 07/15/2012 Document Reviewed: 03/09/2011 Else[employer] Interactive Patient Education 2016 [employer] GENERAL RISKS AND COMPLICATIONS  What are the risk, side effects and possible complications? Generally speaking, most procedures are safe.  However, with any procedure there are risks, side effects, and the possibility of complications.  The risks and complications are dependent upon the sites that are lesioned, or the type of nerve block to be performed.  The closer the procedure is to the spine, the more serious the  risks are.  Great care is taken when placing the radio frequency needles, block needles or lesioning probes, but sometimes complications can occur.  Infection: Any time there is an injection through the skin, there is a risk of infection.  This is why sterile conditions are used for these blocks.  There are four possible types of infection.  Localized skin infection.  Central Nervous System Infection-This can be in the form of Meningitis, which can be deadly.  Epidural Infections-This can be in the form of an epidural abscess, which can cause pressure inside of the spine, causing compression of the spinal cord with subsequent paralysis. This would require an emergency surgery to decompress, and there are no guarantees that the patient would recover from the paralysis.  Discitis-This is an infection of the intervertebral discs.  It occurs in about 1% of discography procedures.  It is difficult to treat and it may lead to surgery.        2. Pain: the needles have to go through skin and soft tissues, will cause soreness.         3. Damage to internal structures:  The nerves to be lesioned may be near blood vessels or    other nerves which can be potentially damaged.       4. Bleeding: Bleeding is more common if the patient is taking blood thinners such as  aspirin, Coumadin, Ticiid, Plavix, etc., or if he/she have some genetic predisposition  such as hemophilia. Bleeding into the spinal canal can cause compression of the spinal  cord with subsequent paralysis.  This would require an emergency surgery to  decompress and there are no guarantees that the patient would recover from the  paralysis.       5. Pneumothorax:  Puncturing of a lung is a possibility, every time a needle is introduced in  the area of the chest or upper back.  Pneumothorax refers to free air around the  collapsed lung(s), inside of the thoracic cavity (chest cavity).  Another two possible  complications related to a similar event would  include: Hemothorax and Chylothorax.   These are variations of the Pneumothorax, where instead of air around the collapsed  lung(s), you may have blood or chyle, respectively.       6. Spinal headaches: They may occur with any procedures in the area of the spine.       7. Persistent CSF (Cerebro-Spinal Fluid) leakage: This is a rare problem, but may occur  with prolonged intrathecal or epidural catheters either due to the formation of a fistulous  track or a dural tear.       8. Nerve damage: By working so close to the spinal cord, there is always a possibility of  nerve damage, which could be as serious as a permanent spinal cord injury with  paralysis.       9. Death:  Although rare, severe deadly allergic reactions known as "Anaphylactic  reaction" can occur to any of the medications used.      10. Worsening of the symptoms:  We can always make thing worse.  What are the chances of something like this happening? Chances of any of this occuring are extremely low.  By statistics, you have more of a chance of getting killed in a motor vehicle accident: while driving to the hospital than any of the above occurring .  Nevertheless, you should be aware that they are possibilities.  In general, it is similar to taking a shower.  Everybody knows that you can slip, hit your head and get killed.  Does that mean that you should not shower again?  Nevertheless always keep in mind that statistics do not mean anything if you happen to be on the wrong side of them.  Even if a procedure has a 1 (one) in a 1,000,000 (million) chance of going wrong, it you happen to be that one..Also, keep in mind that by statistics, you have more of a chance of having something go wrong when taking medications.  Who should not have this procedure? If you are on a blood thinning medication (e.g. Coumadin, Plavix, see list of "Blood Thinners"), or if you have an active infection going on, you should not have the procedure.  If you are  taking any blood thinners, please inform your physician.  How should I prepare for this procedure?  Do not eat or drink anything at least six hours prior to the procedure.  Bring a driver with you .  It cannot be a taxi.  Come accompanied by an adult that can drive you back, and   that is strong enough to help you if your legs get weak or numb from the local anesthetic.  Take all of your medicines the morning of the procedure with just enough water to swallow them.  If you have diabetes, make sure that you are scheduled to have your procedure done first thing in the morning, whenever possible.  If you have diabetes, take only half of your insulin dose and notify our nurse that you have done so as soon as you arrive at the clinic.  If you are diabetic, but only take blood sugar pills (oral hypoglycemic), then do not take them on the morning of your procedure.  You may take them after you have had the procedure.  Do not take aspirin or any aspirin-containing medications, at least eleven (11) days prior to the procedure.  They may prolong bleeding.  Wear loose fitting clothing that may be easy to take off and that you would not mind if it got stained with Betadine or blood.  Do not wear any jewelry or perfume  Remove any nail coloring.  It will interfere with some of our monitoring equipment.  NOTE: Remember that this is not meant to be interpreted as a complete list of all possible complications.  Unforeseen problems may occur.  BLOOD THINNERS The following drugs contain aspirin or other products, which can cause increased bleeding during surgery and should not be taken for 2 weeks prior to and 1 week after surgery.  If you should need take something for relief of minor pain, you may take acetaminophen which is found in Tylenol,m Datril, Anacin-3 and Panadol. It is not blood thinner. The products listed below are.  Do not take any of the products listed below in addition to any listed on  your instruction sheet.  A.P.C or A.P.C with Codeine Codeine Phosphate Capsules #3 Ibuprofen Ridaura  ABC compound Congesprin Imuran rimadil  Advil Cope Indocin Robaxisal  Alka-Seltzer Effervescent Pain Reliever and Antacid Coricidin or Coricidin-D  Indomethacin Rufen  Alka-Seltzer plus Cold Medicine Cosprin Ketoprofen S-A-C Tablets  Anacin Analgesic Tablets or Capsules Coumadin Korlgesic Salflex  Anacin Extra Strength Analgesic tablets or capsules CP-2 Tablets Lanoril Salicylate  Anaprox Cuprimine Capsules Levenox Salocol  Anexsia-D Dalteparin Magan Salsalate  Anodynos Darvon compound Magnesium Salicylate Sine-off  Ansaid Dasin Capsules Magsal Sodium Salicylate  Anturane Depen Capsules Marnal Soma  APF Arthritis pain formula Dewitt's Pills Measurin Stanback  Argesic Dia-Gesic Meclofenamic Sulfinpyrazone  Arthritis Bayer Timed Release Aspirin Diclofenac Meclomen Sulindac  Arthritis pain formula Anacin Dicumarol Medipren Supac  Analgesic (Safety coated) Arthralgen Diffunasal Mefanamic Suprofen  Arthritis Strength Bufferin Dihydrocodeine Mepro Compound Suprol  Arthropan liquid Dopirydamole Methcarbomol with Aspirin Synalgos  ASA tablets/Enseals Disalcid Micrainin Tagament  Ascriptin Doan's Midol Talwin  Ascriptin A/D Dolene Mobidin Tanderil  Ascriptin Extra Strength Dolobid Moblgesic Ticlid  Ascriptin with Codeine Doloprin or Doloprin with Codeine Momentum Tolectin  Asperbuf Duoprin Mono-gesic Trendar  Aspergum Duradyne Motrin or Motrin IB Triminicin  Aspirin plain, buffered or enteric coated Durasal Myochrisine Trigesic  Aspirin Suppositories Easprin Nalfon Trillsate  Aspirin with Codeine Ecotrin Regular or Extra Strength Naprosyn Uracel  Atromid-S Efficin Naproxen Ursinus  Auranofin Capsules Elmiron Neocylate Vanquish  Axotal Emagrin Norgesic Verin  Azathioprine Empirin or Empirin with Codeine Normiflo Vitamin E  Azolid Emprazil Nuprin Voltaren  Bayer Aspirin plain, buffered or  children's or timed BC Tablets or powders Encaprin Orgaran Warfarin Sodium  Buff-a-Comp Enoxaparin Orudis Zorpin  Buff-a-Comp with Codeine Equegesic Os-Cal-Gesic   Buffaprin Excedrin plain, buffered   or Extra Strength Oxalid   Bufferin Arthritis Strength Feldene Oxphenbutazone   Bufferin plain or Extra Strength Feldene Capsules Oxycodone with Aspirin   Bufferin with Codeine Fenoprofen Fenoprofen Pabalate or Pabalate-SF   Buffets II Flogesic Panagesic   Buffinol plain or Extra Strength Florinal or Florinal with Codeine Panwarfarin   Buf-Tabs Flurbiprofen Penicillamine   Butalbital Compound Four-way cold tablets Penicillin   Butazolidin Fragmin Pepto-Bismol   Carbenicillin Geminisyn Percodan   Carna Arthritis Reliever Geopen Persantine   Carprofen Gold's salt Persistin   Chloramphenicol Goody's Phenylbutazone   Chloromycetin Haltrain Piroxlcam   Clmetidine heparin Plaquenil   Cllnoril Hyco-pap Ponstel   Clofibrate Hydroxy chloroquine Propoxyphen         Before stopping any of these medications, be sure to consult the physician who ordered them.  Some, such as Coumadin (Warfarin) are ordered to prevent or treat serious conditions such as "deep thrombosis", "pumonary embolisms", and other heart problems.  The amount of time that you may need off of the medication may also vary with the medication and the reason for which you were taking it.  If you are taking any of these medications, please make sure you notify your pain physician before you undergo any procedures.          

## 2015-08-09 NOTE — Progress Notes (Signed)
Patient's Name: [name] H Bickhart  Patient type: New patient  MRN: [id number]  Service setting: Ambulatory outpatient  DOB: 02/12/1941  Location: [facility]  DOS: 08/09/2015  Primary Care Physician: Milana ObeyKNOWLTON,STEPHEN D, MD  Note by: Sydnee LevansFrancisco A. Laban EmperorNaveira, M.D, DABA, DABAPM, DABPM, Olga CoasterABIPP, FIPP  Referring Physician: Gareth MorganKnowlton, Steve, MD  Specialty: Board-Certified Interventional Pain Management     Primary Reason(s) for Visit: Initial Patient Evaluation CC: Knee Pain; Back Pain; Foot Pain; and Hand Pain   HPI  [name] is a 75 y.o. year old, female patient, who comes today for an initial evaluation. She has DIABETES MELLITUS, TYPE II; OBESITY, MORBID; ANXIETY; DEPRESSION; Essential hypertension, benign; ASTHMA; GERD; IBS; CKD (chronic kidney disease) stage 3, GFR 30-59 ml/min; ARTHRITIS; SLEEP APNEA; PVC's (premature ventricular contractions); Osteoarthritis of knee (Bilateral); Primary osteoarthritis of knee (Bilateral); Chronic knee pain (Bilateral); Chronic pain; and Spinal cord stimulator status on her problem list.. Her primarily concern today is the Knee Pain; Back Pain; Foot Pain; and Hand Pain   Pain Assessment: Self-Reported Pain Score: 6 , clinically she looks like a 3/10. Reported level of pain is not compatible with clinical observations. This symptom exaggeration may be due to malingering, an emotional response, Somatic Symptom Disorder, or a lack of understanding on how the pain scale works. Pain Type: Chronic pain Pain Location: Knee Pain Orientation: Left, Right Pain Descriptors / Indicators: Aching, Constant, Sharp Pain Frequency: Constant  Onset and Duration: Gradual, Date of onset: 12 years ago and Present longer than 3 months Cause of pain: Arthritis Severity: No change since onset, NAS-11 at its worse: 10/10, NAS-11 at its best: 5/10, NAS-11 now: 6/10 and NAS-11 on the average: 6/10 Timing: Not influenced by the time of the day, During activity  or exercise and After activity or exercise Aggravating Factors: Bending, Climbing, Kneeling, Lifiting, Motion, Nerve blocks, Prolonged standing, Squatting, Stooping , Walking, Walking uphill and Walking downhill Alleviating Factors: Stretching, Cold packs, Medications, Resting, Sitting and Relaxation therapy Associated Problems: Constipation, Night-time cramps, Fatigue, Inability to concentrate, Inability to control bladder (urine), Inability to control bowel, Nausea, Numbness, Personality changes, Spasms, Sweating, Swelling, Temperature changes, Tingling, Vomiting  and Weakness Quality of Pain: Annoying, Burning, Constant, Cramping, Disabling, Distressing, Dull, Exhausting, Fearful, Feeling of weight, Heavy, Horrible, Hot, Itching, Pressure-like, Pulsating, Punishing, Sharp, Shooting, Stabbing, Tender, Tingling, Tiring and Uncomfortable Previous Examinations or Tests: Bone scan, Cutaneous Pain Threshold Testing (CPT), CT scan, EMG/PNCV, MRI scan, Nerve block, X-rays, Nerve conduction test, Neurological evaluation, Neurosurgical evaluation, Orthoperdic evaluation, Chiropractic evaluation and Psychiatric evaluation Previous Treatments: Epidural steroid injections, Physical Therapy, Spinal cord stimulator, TENS, Traction and Trigger point injections  The patient comes into this clinic today for the first time to see if we could fix her spinal cord stimulator. Unfortunately, there was some miscommunication and the [employer] representative this were not here to assist with it. We have rescheduled that particular volition for tomorrow. Today she comes in for an update in terms of her hand problems. Her primary area of pain is that of the knees.  Historic Controlled Substance Pharmacotherapy Review  Previously Prescribed Opioids: Tramadol 50 mg daily Currently Prescribed Analgesic: Tramadol 50 mg daily Medications: Patient brought medications to be checked, as requested MME/day: 5  mg/day Pharmacodynamics: Analgesic Effect: More than 50% Activity Facilitation: Medication(s) allow patient to sit, stand, walk, and do the basic ADLs Perceived Effectiveness: Described as relatively effective, allowing for increase in activities of daily living (ADL) Side-effects or Adverse reactions: None reported Historical Background Evaluation:  [place] PDMP: Five (5) year initial data search conducted. No abnormal patterns identified [place] [employer] Offender Public Information: Non-contributory Historical Hospital-associated UDS Results:  No results found for: THCU, COCAINSCRNUR, PCPSCRNUR, MDMA, AMPHETMU, METHADONE, ETOH UDS Results: No UDS results available at this time UDS Interpretation: N/A Medication Assessment Form: Not applicable. Initial evaluation. The patient has not received any medications from our practice Treatment compliance: Not applicable. Initial evaluation Risk Assessment: Aberrant Behavior: None observed or detected today Opioid Fatal Overdose Risk Factors: None identified today Non-fatal overdose hazard ratio (HR): Calculation deferred Fatal overdose hazard ratio (HR): Calculation deferred Substance Use Disorder (SUD) Risk Level: Pending results of Medical Psychology Evaluation for SUD Opioid Risk Tool (ORT) Score: Total Score: 0 Low Risk for SUD (Score <3) Depression Scale Score: PHQ-2: PHQ-2 Total Score: 0 No depression (0) PHQ-9: PHQ-9 Total Score: 0 No depression (0-4)  Pharmacologic Plan: Pending ordered tests and/or consults  Meds  The patient has a current medication list which includes the following prescription(s): accu-chek aviva plus, albuterol, alendronate, amlodipine, benzonatate, vitamin d3, colchicine, cyanocobalamin, dextromethorphan-guaifenesin, dicyclomine, doxepin, doxycycline, furosemide, gabapentin, glimepiride, guaifenesin-codeine, guaifenesin-dextromethorphan, levothyroxine, levothyroxine, losartan, meloxicam, metformin,  naproxen, omeprazole, proair hfa, probiotic product, promethazine, sucralfate, tramadol, and vitamin d (ergocalciferol).  ROS  Cardiovascular History: Daily Aspirin intake, Hypertension, Chest pain and Heart surgery Pulmonary or Respiratory History: Asthma, Shortness of breath, Snoring , Bronchitis and Sleep apnea Neurological History: Stroke (Residual deficits or weakness: None) Review of Past Neurological Studies:  Results for orders placed or performed during the hospital encounter of 05/21/07  CT Head Wo Contrast   Narrative   History: Vomiting, diabetes, hypertension   CT HEAD WITHOUT CONTRAST:   Routine noncontrast CT head compared to 06/28/2004   Mild generalized atrophy. Normal ventricular morphology. No midline shift or mass-effect. Dense calcifications in basal ganglia and in central cerebellar hemispheres bilaterally. Metallic artifact at right skull base at expected position of proximal right middle cerebral artery, likely aneurysm coiling. Old right basal ganglia lacunar infarct with  extension of infarct into right frontoparietal periventricular white matter. No acute infarct, intracranial hemorrhage, or mass. Visualized paranasal sinuses and mastoid air cells clear. Bones unremarkable.   IMPRESSION: Status post coiling of right-sided aneurysm. Atrophy with old right-sided infarct as above. No acute intracranial abnormalities.  Provider: Wilmer Floor, Amy Talbott  Results for orders placed or performed during the hospital encounter of 05/07/07  MR Brain Wo Contrast   Narrative   Clinical Data: Patient with persistent headaches. 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 Complaining of nausea, difficulty walking, and weakness.   MRI BRAIN WITHOUT CONTRAST:  Technique: Multiplanar and multiecho pulse sequences of the brain and surrounding structures were obtained according to standard protocol without IV contrast.  Comparison: The study was  read in conjunction with the previous MRI scan of 05/17/05 and also the angiogram of 07/01/04.   Findings: The gray-white matter differentiation is normal. The sella is appropriate in signal and morphology. The clival signal is normal. The cerebellar tonsils are above the level of the foramen magnum. The odontoid process, the predental space, and the prevertebral soft tissues are also within normal limits for patient's age. No acute diffusion-weighted abnormalities are seen. Axial FLAIR and T2-weighted images demonstrate the presence of old ischemic infarct in the right centrum semiovale, unchanged. Nonspecific subcortical white matter supratentorially and in the subcortical white matter bifrontally. The corona radiata right greater than left, the centrum semiovale, and the bifrontal subcortical white matter remain stable. Mild  prominence of the sulci overlying the cervical convexity is noted. The ventricles are at the upper limits of normal. The mastoids are well aerated. The internal auditory canals are symmetrical in signal and morphology. Flow voids are maintained in the major vessels at the cranial skull base. There is mild to moderate thickening of the mucosa in the ethmoid air cells and the frontal sinuses.   The visualized orbital contents are grossly normal.   No abnormal blood breakdown products seen on the SPGR sequences.   Bilateral physiologic mineralization of the basal ganglia, globus pallidi, the putamina, and also the dentate nuclei of the cerebellar hemispheres is noted.   IMPRESSION:  1. No evidence of acute ischemia.   2. 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   3. Probable small vessel type disease changes supratentorially as described, probably related to chronic hypertension and/or diabetes with less likely possibility of a demyelinating process or vasculitis. This also appears stable. Mild mucosal thickening in the ethmoids and maxillary  sinuses.   MR ANGIOGRAPHY OF HEAD:  Technique: 3-D time of flight pulse sequence was performed to examine the cerebral vasculature, centered at the circle of Willis, without IV contrast. Multiplanar MR image reconstructions were generated to evaluate the vascular anatomy.  Findings: The petrous, the cavernous, and the supraclinoid ICAs demonstrate adequate caliber and flow signal.   The right middle cerebral artery at its origin has a severe signal drop off. The right middle cerebral artery distal to this, the right anterior cerebral artery, the left middle cerebral artery, and the anterior cerebral arteries are otherwise of adequate caliber and flow signal. The MCA trifurcation branches are normal. The ACOM region is within normal limits also.   The vertebrobasilar junctions are patent with flow signal demonstrated in the right PICA. The basilar artery, the posterior cerebral arteries, the superior cerebellar arteries, and the anterior inferior cerebellar arteries demonstrate adequate caliber and flow signal.   Mild focal areas of caliber irregularity in the PCAs may be vessel tortuosity or arteriosclerotic changes.   IMPRESSION:  1. Severe signal drop off in the right middle cerebral artery at its origin. This may be related to the metallic signal artifacts caused by the previous endovascularly treated aneurysm. A focal area of severe stenosis, though possible, is felt to be less likely.   2. Caliber irregularity of the PCAs, which may reflect mild arteriosclerotic changes versus vessel tortuosity.   3. Incidental note is made of bilaterally dominant PCOMs with a small infundibulum in the origin of the left PCOM, unchanged.   4. No evidence of recurrence of the aneurysm in the right supraclinoid region is suggested.  Provider: Council Mechanic  MR Angiogram Head Wo Contrast   Narrative   Clinical Data: Patient with persistent headaches. 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  Complaining of nausea, difficulty walking, and weakness.   MRI BRAIN WITHOUT CONTRAST:  Technique: Multiplanar and multiecho pulse sequences of the brain and surrounding structures were obtained according to standard protocol without IV contrast.  Comparison: The study was read in conjunction with the previous MRI scan of 05/17/05 and also the angiogram of 07/01/04.   Findings: The gray-white matter differentiation is normal. The sella is appropriate in signal and morphology. The clival signal is normal. The cerebellar tonsils are above the level of the foramen magnum. The odontoid process, the predental space, and the prevertebral soft tissues are also within normal limits for patient's age. No acute diffusion-weighted  abnormalities are seen. Axial FLAIR and T2-weighted images demonstrate the presence of old ischemic infarct in the right centrum semiovale, unchanged. Nonspecific subcortical white matter supratentorially and in the subcortical white matter bifrontally. The corona radiata right greater than left, the centrum semiovale, and the bifrontal subcortical white matter remain stable. Mild prominence of the sulci overlying the cervical convexity is noted. The ventricles are at the upper limits of normal. The mastoids are well aerated. The internal auditory canals are symmetrical in signal and morphology. Flow voids are maintained in the major vessels at the cranial skull base. There is mild to moderate thickening of the mucosa in the ethmoid air cells and the frontal sinuses.   The visualized orbital contents are grossly normal.   No abnormal blood breakdown products seen on the SPGR sequences.   Bilateral physiologic mineralization of the basal ganglia, globus pallidi, the putamina, and also the dentate nuclei of the cerebellar hemispheres is noted.   IMPRESSION:  1. No evidence of acute ischemia.   2. 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   3.  Probable small vessel type disease changes supratentorially as described, probably related to chronic hypertension and/or diabetes with less likely possibility of a demyelinating process or vasculitis. This also appears stable. Mild mucosal thickening in the ethmoids and maxillary sinuses.   MR ANGIOGRAPHY OF HEAD:  Technique: 3-D time of flight pulse sequence was performed to examine the cerebral vasculature, centered at the circle of Willis, without IV contrast. Multiplanar MR image reconstructions were generated to evaluate the vascular anatomy.  Findings: The petrous, the cavernous, and the supraclinoid ICAs demonstrate adequate caliber and flow signal.   The right middle cerebral artery at its origin has a severe signal drop off. The right middle cerebral artery distal to this, the right anterior cerebral artery, the left middle cerebral artery, and the anterior cerebral arteries are otherwise of adequate caliber and flow signal. The MCA trifurcation branches are normal. The ACOM region is within normal limits also.   The vertebrobasilar junctions are patent with flow signal demonstrated in the right PICA. The basilar artery, the posterior cerebral arteries, the superior cerebellar arteries, and the anterior inferior cerebellar arteries demonstrate adequate caliber and flow signal.   Mild focal areas of caliber irregularity in the PCAs may be vessel tortuosity or arteriosclerotic changes.   IMPRESSION:  1. Severe signal drop off in the right middle cerebral artery at its origin. This may be related to the metallic signal artifacts caused by the previous endovascularly treated aneurysm. A focal area of severe stenosis, though possible, is felt to be less likely.   2. Caliber irregularity of the PCAs, which may reflect mild arteriosclerotic changes versus vessel tortuosity.   3. Incidental note is made of bilaterally dominant PCOMs with a small infundibulum in the origin of the left PCOM, unchanged.    4. No evidence of recurrence of the aneurysm in the right supraclinoid region is suggested.  Provider: Council Mechanic  Results for orders placed or performed during the hospital encounter of 05/17/05  MR Brain W Wo Contrast   Narrative   Clinical Data:  one week of right sided headache. MRI BRAIN WITHOUT AND WITH CONTRAST: Technique:  Multiplanar and multiecho pulse sequences of the brain and surrounding structures were obtained according to standard protocol before and after administration of intravenous contrast. Contrast:  20 cc Magnevist. Comparison:  Previous arteriogram 07/01/04.  CT head 06/28/04. Findings:  Sagittal images unremarkable.  Diffusion images are negative for acute stroke but are distorted by magnetic susceptibility artifact.  T2-weighted images show mild atrophy premature for the patient's age of 9.  There is a large area of remote infarction in the right basal ganglia.  FLAIR images show chronic microvascular ischemic change throughout the periventricular and subcortical white matter.  Coronal T2-weighted images highlight the remote infarct and small vessel disease.  T1-weighted images show encephalomalacia and prominent perivascular spaces.  Post-infusion, there is no abnormal enhancement of the brain or meninges.  No significant changes of sinusitis are seen.  Although MR is not sensitive in the detection of subarachnoid blood I do not see obvious signs of parenchymal clot or subarachnoid blood in the basilar cisterns.  IMPRESSION: 1.  Atrophy and chronic microvascular ischemic change.  2.  Remote right basal ganglia and centrum semiovale infarct. 3.  No acute stroke.  4.  No abnormal intracranial enhancement. 5.  No gross MR evidence for subarachnoid hemorrhage; note that MR is insensitive in the detection of  acute subarachnoid blood and that CT is the examination of choice for the question of acute SAH.  MR ANGIOGRAPHY OF HEAD WITHOUT CONTRAST: Technique:  3-D time of  flight pulse sequence was performed to examine the cerebral vasculature, centered at the circle of Willis, without IV contrast.  Multiplanar MR image reconstructions were generated to evaluate the vascular anatomy. Comparison:  Previous angiography 2006. Findings:  Mild non-stenotic atherosclerotic change carotid siphons and proximal MCA, PCA, ACA regions.  No new aneurysms detected.  Mild signal dropout in the region of the right ICA terminus related to the coil mass with magnetic susceptibility artifact.  Basilar artery and both vertebrals widely patent without significant pathology.  IMPRESSION: 1.  No new areas of aneurysmal dilatation and no evidence for regrowth of the previously coiled aneurysm. 2.  Mild intracranial atherosclerotic change as described.  Provider: Gareth Morgan   Psychological-Psychiatric History: Anxiety, Panic Attacks and Insomnia Gastrointestinal History: Hiatal hernia, Reflux or heatburn, Irritable Bowel Syndrome (IBS) and Constipation Genitourinary History: Recurrent Urinary Tract infections Hematological History: Anemia, Brusing easily and Bleeding easily Endocrine History: Insulin-dependent diabetes mellitus Rheumatologic History: Osteoarthritis Musculoskeletal History: Negative for myasthenia gravis, muscular dystrophy, multiple sclerosis or malignant hyperthermia Work History: Disabled  Allergies  Ms. Hargrove is allergic to hydrocodone and naproxen.  PFSH  Medical:  Ms. Laseter  has a past medical history of Asthma; Depression; Type 2 diabetes mellitus with diabetic neuropathy (HCC); GERD (gastroesophageal reflux disease); Essential hypertension, benign; Low back pain; IBS (irritable bowel syndrome); History of recurrent UTIs; Cataract; Glaucoma; Arthritis; migraines; Carpal tunnel syndrome; Morbid obesity (HCC); Sleep apnea; colonic polyps; Anxiety; Carotid artery aneurysm (HCC); Neurogenic bladder; Osteoporosis; Hypothyroidism; Stroke [facility]); and CKD  (chronic kidney disease) stage 3, GFR 30-59 ml/min. Family: family history includes Cancer in her brother, brother, mother, and sister; Diabetes in her brother; Heart attack in her sister; Heart disease in her mother. There is no history of Colon cancer or Colon polyps. Surgical:  has past surgical history that includes Vesicovaginal fistula closure w/ TAH; Carpal tunnel release - bilateral (1992); Total hip replacement - right (2002); Stomach surgery gastropexy for gastric volvulus (2009); Tubular Adenoma; Shoulder surgery; Spinal cord stimulator insertion; Esophagogastroduodenoscopy (08/12/2007); Colonoscopy (N/A, 03/04/2013); Abdominal hysterectomy; Colonoscopy (06/14/2006); and Cerebral aneurysm repair (2004). Tobacco:  reports that she has quit smoking. Her smoking use included Cigarettes. She has never used smokeless tobacco. Alcohol:  reports that she does not drink alcohol. Drug:  reports that she does not  use illicit drugs. Active Ambulatory Problems    Diagnosis Date Noted  . DIABETES MELLITUS, TYPE II 05/13/2007  . OBESITY, MORBID 05/13/2007  . ANXIETY 06/18/2007  . DEPRESSION 05/13/2007  . Essential hypertension, benign 05/13/2007  . ASTHMA 05/13/2007  . GERD 05/13/2007  . IBS 05/13/2007  . CKD (chronic kidney disease) stage 3, GFR 30-59 ml/min 05/13/2007  . ARTHRITIS 05/13/2007  . SLEEP APNEA 05/13/2007  . PVC's (premature ventricular contractions) 07/20/2010  . Osteoarthritis of knee (Bilateral) 08/12/2013  . Primary osteoarthritis of knee (Bilateral) 02/23/2014  . Chronic knee pain (Bilateral) 08/09/2015  . Chronic pain 08/09/2015  . Spinal cord stimulator status 08/09/2015   Resolved Ambulatory Problems    Diagnosis Date Noted  . ASTHMA, WITH ACUTE EXACERBATION 06/26/2008  . OVERACTIVE BLADDER 05/13/2007  . UTI'S, RECURRENT 05/13/2007  . DEGENERATIVE JOINT DISEASE, KNEE 02/24/2009  . SHOULDER PAIN, RIGHT 09/07/2008  . DEGENERATIVE DISC DISEASE, LUMBOSACRAL SPINE  W/RADICULOPATHY 02/24/2009  . LOW BACK PAIN 05/13/2007  . LEG PAIN, RIGHT 09/07/2008  . INSOMNIA 06/23/2008  . Edema 07/05/2007  . Headache(784.0) 06/23/2008  . Nausea with vomiting 07/23/2008  . URINARY INCONTINENCE 05/13/2007  . TRANSAMINASES, SERUM, ELEVATED 02/09/2010  . CATARACT, HX OF 05/13/2007  . CARPAL TUNNEL SYNDROME, HX OF 05/13/2007  . MIGRAINES, HX OF 05/13/2007  . RUPTURE ROTATOR CUFF 03/23/2010  . Preoperative evaluation to rule out surgical contraindication 07/20/2010  . Rotator cuff tear, right 01/04/2011  . Personal history of colonic polyps 02/21/2013  . Knee pain 03/13/2013  . Shortness of breath 07/23/2013   Past Medical History  Diagnosis Date  . Asthma   . Depression   . Type 2 diabetes mellitus with diabetic neuropathy (HCC)   . GERD (gastroesophageal reflux disease)   . Low back pain   . IBS (irritable bowel syndrome)   . History of recurrent UTIs   . Cataract   . Glaucoma   . Arthritis   . Hx of migraines   . Carpal tunnel syndrome   . Sleep apnea   . Hx of colonic polyps   . Anxiety   . Carotid artery aneurysm (HCC)   . Neurogenic bladder   . Osteoporosis   . Hypothyroidism   . Stroke [facility])     Constitutional Exam  Vitals: Blood pressure 111/94, pulse 81, temperature 97.9 F (36.6 C), temperature source Oral, resp. rate 16, height 5' (1.524 m), weight 232 lb (105.235 kg), SpO2 98 %. General appearance: Well nourished, well developed, and well hydrated. In no acute distress Calculated BMI/Body habitus: Body mass index is 45.31 kg/(m^2). (>40 kg/m2) Extreme obesity (Class III) - 254% higher incidence of chronic pain Psych/Mental status: Alert and oriented x 3 (person, place, & time) Eyes: PERLA Respiratory: No evidence of acute respiratory distress  Cervical Spine Exam  Inspection: No masses, redness, or swelling Alignment: Symmetrical ROM: Functional: Adequate ROM Active: Unrestricted ROM Stability: No instability detected Muscle  strength & Tone: Functionally intact Sensory: Unimpaired Palpation: No complaints of tenderness  Upper Extremity (UE) Exam    Side: Right upper extremity  Side: Left upper extremity  Inspection: No masses, redness, swelling, or asymmetry  Inspection: No masses, redness, swelling, or asymmetry  ROM:  ROM:  Functional: Adequate ROM  Functional: Adequate ROM  Active: Unrestricted ROM  Active: Unrestricted ROM  Muscle strength & Tone: Functionally intact  Muscle strength & Tone: Functionally intact  Sensory: Unimpaired  Sensory: Unimpaired  Palpation: Non-contributory  Palpation: Non-contributory   Thoracic Spine Exam  Inspection: No  masses, redness, or swelling Alignment: Symmetrical ROM: Functional: Adequate ROM Active: Unrestricted ROM Stability: No instability detected Sensory: Unimpaired Muscle strength & Tone: Functionally intact Palpation: No complaints of tenderness  Lumbar Spine Exam  Inspection: No masses, redness, or swelling Alignment: Symmetrical ROM: Functional: Limited ROM Active: Limited ROM due to body habitus. Stability: No instability detected Muscle strength & Tone: Functionally intact Sensory: Unimpaired Palpation: No complaints of tenderness Provocative Tests: Lumbar Hyperextension and rotation test: deferred Patrick's Maneuver: deferred  Gait & Posture Assessment  Gait: Modified gait pattern (slower gait speed, wider stride width, and longer stance duration) associated with morbid obesity Posture: WNL  Lower Extremity Exam    Side: Right lower extremity  Side: Left lower extremity  Inspection: No masses, redness, swelling, or asymmetry ROM:  Inspection: No masses, redness, swelling, or asymmetry ROM:  Functional: Limited ROM in the knees   Functional: Limited ROM in the knees   Active: Decreased ROM in the knees   Active: Limited ROM in the knees   Muscle strength & Tone: Functionally intact  Muscle strength & Tone: Functionally intact  Sensory:  Unimpaired  Sensory: Unimpaired  Palpation: Non-contributory  Palpation: Non-contributory   Assessment  Primary Diagnosis & Pertinent Problem List: The primary encounter diagnosis was Chronic knee pain (Bilateral). Diagnoses of Chronic pain, Spinal cord stimulator status, Primary osteoarthritis of both knees, and Primary osteoarthritis of knee (Bilateral) were also pertinent to this visit.  Visit Diagnosis: 1. Chronic knee pain (Bilateral)   2. Chronic pain   3. Spinal cord stimulator status   4. Primary osteoarthritis of both knees   5. Primary osteoarthritis of knee (Bilateral)     Assessment: No problem-specific assessment & plan notes found for this encounter.   Plan of Care  Initial Treatment Plan:  Please be advised that as per protocol, today's visit has been an evaluation only. We have not taken over the patient's controlled substance management.  Problem List Items Addressed This Visit      High   Chronic knee pain (Bilateral) - Primary (Chronic)   Relevant Orders   KNEE INJECTION   Chronic pain (Chronic)   Osteoarthritis of knee (Bilateral) (Chronic)   Primary osteoarthritis of knee (Bilateral) (Chronic)     Medium   Spinal cord stimulator status      Pharmacotherapy (Medications Ordered): No orders of the defined types were placed in this encounter.    Lab-work & Procedure Ordered: Orders Placed This Encounter  Procedures  . KNEE INJECTION    Imaging Ordered: None  Interventional Therapies: Scheduled: Bilateral intra-articular knee injection with local anesthetic and steroids. Considering:   Possible Hyalgan knee injection series.   Possible genicular nerve blocks followed by radiofrequency ablation. PRN Procedures: None at this time.   Referral(s) or Consult(s): Medical psychology consult for substance use disorder evaluation  Medications administered during this visit: Ms. Sramek had no medications administered during this  visit.  Prescriptions ordered during this visit: New Prescriptions   No medications on file    Requested PM Follow-up: Return for Procedure (ASAP).  No future appointments.   Primary Care Physician: Milana Obey, MD Location: [facility] Note by: Sydnee Levans. Laban Emperor, M.D, DABA, DABAPM, DABPM, DABIPP, FIPP  Pain Score Disclaimer: We use the NRS-11 scale. This is a self-reported, subjective measurement of pain severity with only modest accuracy. It is used primarily to identify changes within a particular patient. It must be understood that outpatient pain scales are significantly less accurate that those used  for research, where they can be applied under ideal controlled circumstances with minimal exposure to variables. In reality, the score is likely to be a combination of pain intensity and pain affect, where pain affect describes the degree of emotional arousal or changes in action readiness caused by the sensory experience of pain. Factors such as social and work situation, setting, emotional state, anxiety levels, expectation, and prior pain experience may influence pain perception and show large inter-individual differences that may also be affected by time variables.  Patient instructions provided during this appointment: Patient Instructions   Pain Management Discharge Instructions  General Discharge Instructions :  If you need to reach your doctor call: Monday-Friday 8:00 am - 4:00 pm at [phone] or toll free [phone].  After clinic hours [phone] to have operator reach doctor.  Bring all of your medication bottles to all your appointments in the pain clinic.  To cancel or reschedule your appointment with Pain Management please remember to call 24 hours in advance to avoid a fee.  Refer to the educational materials which you have been given on: General Risks, I had my Procedure. Discharge Instructions, Post Sedation.  Post  Procedure Instructions:  The drugs you were given will stay in your system until tomorrow, so for the next 24 hours you should not drive, make any legal decisions or drink any alcoholic beverages.  You may eat anything you prefer, but it is better to start with liquids then soups and crackers, and gradually work up to solid foods.  Please notify your doctor immediately if you have any unusual bleeding, trouble breathing or pain that is not related to your normal pain.  Depending on the type of procedure that was done, some parts of your body may feel week and/or numb.  This usually clears up by tonight or the next day.  Walk with the use of an assistive device or accompanied by an adult for the 24 hours.  You may use ice on the affected area for the first 24 hours.  Put ice in a Ziploc bag and cover with a towel and place against area 15 minutes on 15 minutes off.  You may switch to heat after 24 hours.Trigger Point Injection Trigger points are areas where you have muscle pain. A trigger point injection is a shot given in the trigger point to relieve that pain. A trigger point might feel like a knot in your muscle. It hurts to press on a trigger point. Sometimes the pain spreads out (radiates) to other parts of the body. For example, pressing on a trigger point in your shoulder might cause pain in your arm or neck. You might have one trigger point. Or, you might have more than one. People often have trigger points in their upper back and lower back. They also occur often in the neck and shoulders. Pain from a trigger point lasts for a long time. It can make it hard to keep moving. You might not be able to do the exercise or physical therapy that could help you deal with the pain. A trigger point injection may help. It does not work for everyone. But, it may relieve your pain for a few days or a few months. A trigger point injection does not cure long-lasting (chronic) pain. LET YOUR CAREGIVER KNOW  ABOUT:  Any allergies (especially to latex, lidocaine, or steroids).  Blood-thinning medicines that you take. These drugs can lead to bleeding or bruising after an injection. They include:  Aspirin.  Ibuprofen.  Clopidogrel.  Warfarin.  Other medicines you take. This includes all vitamins, herbs, eyedrops, over-the-counter medicines, and creams.  Use of steroids.  Recent infections.  Past problems with numbing medicines.  Bleeding problems.  Surgeries you have had.  Other health problems. RISKS AND COMPLICATIONS A trigger point injection is a safe treatment. However, problems may develop, such as:  Minor side effects usually go away in 1 to 2 days. These may include:  Soreness.  Bruising.  Stiffness.  More serious problems are rare. But, they may include:  Bleeding under the skin (hematoma).  Skin infection.  Breaking off of the needle under your skin.  Lung puncture.  The trigger point injection may not work for you. BEFORE THE PROCEDURE You may need to stop taking any medicine that thins your blood. This is to prevent bleeding and bruising. Usually these medicines are stopped several days before the injection. No other preparation is needed. PROCEDURE  A trigger point injection can be given in your caregiver's office or in a clinic. Each injection takes 2 minutes or less.  Your caregiver will feel for trigger points. The caregiver may use a marker to circle the area for the injection.  The skin over the trigger point will be washed with a germ-killing (antiseptic) solution.  The caregiver pinches the spot for the injection.  Then, a very thin needle is used for the shot. You may feel pain or a twitching feeling when the needle enters the trigger point.  A numbing solution may be injected into the trigger point. Sometimes a drug to keep down swelling, redness, and warmth (inflammation) is also injected.  Your caregiver moves the needle around the  trigger zone until the tightness and twitching goes away.  After the injection, your caregiver may put gentle pressure over the injection site.  Then it is covered with a bandage. AFTER THE PROCEDURE  You can go right home after the injection.  The bandage can be taken off after a few hours.  You may feel sore and stiff for 1 to 2 days.  Go back to your regular activities slowly. Your caregiver may ask you to stretch your muscles. Do not do anything that takes extra energy for a few days.  Follow your caregiver's instructions to manage and treat other pain.   This information is not intended to replace advice given to you by your health care provider. Make sure you discuss any questions you have with your health care provider.   Document Released: 03/09/2011 Document Revised: 07/15/2012 Document Reviewed: 03/09/2011 Elsevier Interactive Patient Education 2016 [employer] GENERAL RISKS AND COMPLICATIONS  What are the risk, side effects and possible complications? Generally speaking, most procedures are safe.  However, with any procedure there are risks, side effects, and the possibility of complications.  The risks and complications are dependent upon the sites that are lesioned, or the type of nerve block to be performed.  The closer the procedure is to the spine, the more serious the risks are.  Great care is taken when placing the radio frequency needles, block needles or lesioning probes, but sometimes complications can occur.  Infection: Any time there is an injection through the skin, there is a risk of infection.  This is why sterile conditions are used for these blocks.  There are four possible types of infection.  Localized skin infection.  Central Nervous System Infection-This can be in the form of Meningitis, which can be deadly.  Epidural Infections-This can be in the form  of an epidural abscess, which can cause pressure inside of the spine, causing compression of the  spinal cord with subsequent paralysis. This would require an emergency surgery to decompress, and there are no guarantees that the patient would recover from the paralysis.  Discitis-This is an infection of the intervertebral discs.  It occurs in about 1% of discography procedures.  It is difficult to treat and it may lead to surgery.        2. Pain: the needles have to go through skin and soft tissues, will cause soreness.       3. Damage to internal structures:  The nerves to be lesioned may be near blood vessels or    other nerves which can be potentially damaged.       4. Bleeding: Bleeding is more common if the patient is taking blood thinners such as  aspirin, Coumadin, Ticiid, Plavix, etc., or if he/she have some genetic predisposition  such as hemophilia. Bleeding into the spinal canal can cause compression of the spinal  cord with subsequent paralysis.  This would require an emergency surgery to  decompress and there are no guarantees that the patient would recover from the  paralysis.       5. Pneumothorax:  Puncturing of a lung is a possibility, every time a needle is introduced in  the area of the chest or upper back.  Pneumothorax refers to free air around the  collapsed lung(s), inside of the thoracic cavity (chest cavity).  Another two possible  complications related to a similar event would include: Hemothorax and Chylothorax.   These are variations of the Pneumothorax, where instead of air around the collapsed  lung(s), you may have blood or chyle, respectively.       6. Spinal headaches: They may occur with any procedures in the area of the spine.       7. Persistent CSF (Cerebro-Spinal Fluid) leakage: This is a rare problem, but may occur  with prolonged intrathecal or epidural catheters either due to the formation of a fistulous  track or a dural tear.       8. Nerve damage: By working so close to the spinal cord, there is always a possibility of  nerve damage, which could be as  serious as a permanent spinal cord injury with  paralysis.       9. Death:  Although rare, severe deadly allergic reactions known as "Anaphylactic  reaction" can occur to any of the medications used.      10. Worsening of the symptoms:  We can always make thing worse.  What are the chances of something like this happening? Chances of any of this occuring are extremely low.  By statistics, you have more of a chance of getting killed in a motor vehicle accident: while driving to the hospital than any of the above occurring .  Nevertheless, you should be aware that they are possibilities.  In general, it is similar to taking a shower.  Everybody knows that you can slip, hit your head and get killed.  Does that mean that you should not shower again?  Nevertheless always keep in mind that statistics do not mean anything if you happen to be on the wrong side of them.  Even if a procedure has a 1 (one) in a 1,000,000 (million) chance of going wrong, it you happen to be that one..Also, keep in mind that by statistics, you have more of a chance of having something go wrong when  taking medications.  Who should not have this procedure? If you are on a blood thinning medication (e.g. Coumadin, Plavix, see list of "Blood Thinners"), or if you have an active infection going on, you should not have the procedure.  If you are taking any blood thinners, please inform your physician.  How should I prepare for this procedure?  Do not eat or drink anything at least six hours prior to the procedure.  Bring a driver with you .  It cannot be a taxi.  Come accompanied by an adult that can drive you back, and that is strong enough to help you if your legs get weak or numb from the local anesthetic.  Take all of your medicines the morning of the procedure with just enough water to swallow them.  If you have diabetes, make sure that you are scheduled to have your procedure done first thing in the morning, whenever  possible.  If you have diabetes, take only half of your insulin dose and notify our nurse that you have done so as soon as you arrive at the clinic.  If you are diabetic, but only take blood sugar pills (oral hypoglycemic), then do not take them on the morning of your procedure.  You may take them after you have had the procedure.  Do not take aspirin or any aspirin-containing medications, at least eleven (11) days prior to the procedure.  They may prolong bleeding.  Wear loose fitting clothing that may be easy to take off and that you would not mind if it got stained with Betadine or blood.  Do not wear any jewelry or perfume  Remove any nail coloring.  It will interfere with some of our monitoring equipment.  NOTE: Remember that this is not meant to be interpreted as a complete list of all possible complications.  Unforeseen problems may occur.  BLOOD THINNERS The following drugs contain aspirin or other products, which can cause increased bleeding during surgery and should not be taken for 2 weeks prior to and 1 week after surgery.  If you should need take something for relief of minor pain, you may take acetaminophen which is found in Tylenol,m Datril, Anacin-3 and Panadol. It is not blood thinner. The products listed below are.  Do not take any of the products listed below in addition to any listed on your instruction sheet.  A.P.C or A.P.C with Codeine Codeine Phosphate Capsules #3 Ibuprofen Ridaura  ABC compound Congesprin Imuran rimadil  Advil Cope Indocin Robaxisal  Alka-Seltzer Effervescent Pain Reliever and Antacid Coricidin or Coricidin-D  Indomethacin Rufen  Alka-Seltzer plus Cold Medicine Cosprin Ketoprofen S-A-C Tablets  Anacin Analgesic Tablets or Capsules Coumadin Korlgesic Salflex  Anacin Extra Strength Analgesic tablets or capsules CP-2 Tablets Lanoril Salicylate  Anaprox Cuprimine Capsules Levenox Salocol  Anexsia-D Dalteparin Magan Salsalate  Anodynos Darvon compound  Magnesium Salicylate Sine-off  Ansaid Dasin Capsules Magsal Sodium Salicylate  Anturane Depen Capsules Marnal Soma  APF Arthritis pain formula Dewitt's Pills Measurin Stanback  Argesic Dia-Gesic Meclofenamic Sulfinpyrazone  Arthritis Bayer Timed Release Aspirin Diclofenac Meclomen Sulindac  Arthritis pain formula Anacin Dicumarol Medipren Supac  Analgesic (Safety coated) Arthralgen Diffunasal Mefanamic Suprofen  Arthritis Strength Bufferin Dihydrocodeine Mepro Compound Suprol  Arthropan liquid Dopirydamole Methcarbomol with Aspirin Synalgos  ASA tablets/Enseals Disalcid Micrainin Tagament  Ascriptin Doan's Midol Talwin  Ascriptin A/D Dolene Mobidin Tanderil  Ascriptin Extra Strength Dolobid Moblgesic Ticlid  Ascriptin with Codeine Doloprin or Doloprin with Codeine Momentum Tolectin  Asperbuf Duoprin Mono-gesic Proofreader  Aspergum Duradyne Motrin or Motrin IB Triminicin  Aspirin plain, buffered or enteric coated Durasal Myochrisine Trigesic  Aspirin Suppositories Easprin Nalfon Trillsate  Aspirin with Codeine Ecotrin Regular or Extra Strength Naprosyn Uracel  Atromid-S Efficin Naproxen Ursinus  Auranofin Capsules Elmiron Neocylate Vanquish  Axotal Emagrin Norgesic Verin  Azathioprine Empirin or Empirin with Codeine Normiflo Vitamin E  Azolid Emprazil Nuprin Voltaren  Bayer Aspirin plain, buffered or children's or timed BC Tablets or powders Encaprin Orgaran Warfarin Sodium  Buff-a-Comp Enoxaparin Orudis Zorpin  Buff-a-Comp with Codeine Equegesic Os-Cal-Gesic   Buffaprin Excedrin plain, buffered or Extra Strength Oxalid   Bufferin Arthritis Strength Feldene Oxphenbutazone   Bufferin plain or Extra Strength Feldene Capsules Oxycodone with Aspirin   Bufferin with Codeine Fenoprofen Fenoprofen Pabalate or Pabalate-SF   Buffets II Flogesic Panagesic   Buffinol plain or Extra Strength Florinal or Florinal with Codeine Panwarfarin   Buf-Tabs Flurbiprofen Penicillamine   Butalbital Compound  Four-way cold tablets Penicillin   Butazolidin Fragmin Pepto-Bismol   Carbenicillin Geminisyn Percodan   Carna Arthritis Reliever Geopen Persantine   Carprofen Gold's salt Persistin   Chloramphenicol Goody's Phenylbutazone   Chloromycetin Haltrain Piroxlcam   Clmetidine heparin Plaquenil   Cllnoril Hyco-pap Ponstel   Clofibrate Hydroxy chloroquine Propoxyphen         Before stopping any of these medications, be sure to consult the physician who ordered them.  Some, such as Coumadin (Warfarin) are ordered to prevent or treat serious conditions such as "deep thrombosis", "pumonary embolisms", and other heart problems.  The amount of time that you may need off of the medication may also vary with the medication and the reason for which you were taking it.  If you are taking any of these medications, please make sure you notify your pain physician before you undergo any procedures.

## 2015-09-07 ENCOUNTER — Ambulatory Visit: Payer: Medicare HMO | Attending: Pain Medicine | Admitting: Pain Medicine

## 2015-09-07 ENCOUNTER — Encounter: Payer: Self-pay | Admitting: Pain Medicine

## 2015-09-07 VITALS — BP 137/94 | HR 100 | Temp 98.0°F | Resp 18 | Ht 60.0 in | Wt 235.0 lb

## 2015-09-07 DIAGNOSIS — G4730 Sleep apnea, unspecified: Secondary | ICD-10-CM | POA: Insufficient documentation

## 2015-09-07 DIAGNOSIS — I493 Ventricular premature depolarization: Secondary | ICD-10-CM | POA: Diagnosis not present

## 2015-09-07 DIAGNOSIS — M25561 Pain in right knee: Secondary | ICD-10-CM

## 2015-09-07 DIAGNOSIS — I129 Hypertensive chronic kidney disease with stage 1 through stage 4 chronic kidney disease, or unspecified chronic kidney disease: Secondary | ICD-10-CM | POA: Insufficient documentation

## 2015-09-07 DIAGNOSIS — F418 Other specified anxiety disorders: Secondary | ICD-10-CM | POA: Insufficient documentation

## 2015-09-07 DIAGNOSIS — E1122 Type 2 diabetes mellitus with diabetic chronic kidney disease: Secondary | ICD-10-CM | POA: Diagnosis not present

## 2015-09-07 DIAGNOSIS — M25569 Pain in unspecified knee: Secondary | ICD-10-CM | POA: Diagnosis present

## 2015-09-07 DIAGNOSIS — K589 Irritable bowel syndrome without diarrhea: Secondary | ICD-10-CM | POA: Diagnosis not present

## 2015-09-07 DIAGNOSIS — J45909 Unspecified asthma, uncomplicated: Secondary | ICD-10-CM | POA: Insufficient documentation

## 2015-09-07 DIAGNOSIS — M25562 Pain in left knee: Secondary | ICD-10-CM

## 2015-09-07 DIAGNOSIS — K219 Gastro-esophageal reflux disease without esophagitis: Secondary | ICD-10-CM | POA: Insufficient documentation

## 2015-09-07 DIAGNOSIS — M170 Bilateral primary osteoarthritis of knee: Secondary | ICD-10-CM

## 2015-09-07 DIAGNOSIS — G8929 Other chronic pain: Secondary | ICD-10-CM | POA: Diagnosis not present

## 2015-09-07 DIAGNOSIS — Z6841 Body Mass Index (BMI) 40.0 and over, adult: Secondary | ICD-10-CM | POA: Insufficient documentation

## 2015-09-07 MED ORDER — METHYLPREDNISOLONE ACETATE 80 MG/ML IJ SUSP: 80.0000 mg | Freq: Once | INTRAMUSCULAR | Status: DC

## 2015-09-07 MED ORDER — SODIUM HYALURONATE (VISCOSUP) 20 MG/2ML IX SOSY
2.0000 mL | PREFILLED_SYRINGE | Freq: Once | INTRA_ARTICULAR | Status: AC
  Administered 2015-09-07: 2 mL via INTRA_ARTICULAR
  Filled 2015-09-07: qty 2

## 2015-09-07 MED ORDER — ROPIVACAINE HCL 2 MG/ML IJ SOLN: 9.0000 mL | Freq: Once | INTRAMUSCULAR | Status: DC

## 2015-09-07 MED ORDER — LIDOCAINE HCL (PF) 1 % IJ SOLN
10.0000 mL | Freq: Once | INTRAMUSCULAR | Status: AC
  Administered 2015-09-07: 10 mL
  Filled 2015-09-07: qty 5

## 2015-09-07 NOTE — Progress Notes (Signed)
Patient's Name: [name]  Patient type: Established  MRN: [id number]  Service setting: Ambulatory outpatient  DOB: 10/07/1940  Location: [facility]  DOS: 09/07/2015  Primary Care Physician: Robert Bellow, MD  Note by: Kathlen Brunswick. Dossie Arbour, M.D, DABA, DABAPM, DABPM, Milagros Evener, FIPP  Referring Physician: Milinda Pointer, MD  Specialty: Board-Certified Interventional Pain Management  Last Visit to Pain Management: 08/09/2015   Primary Reason(s) for Visit: Interventional Pain Management Treatment. CC: Knee Pain  Primary Diagnosis: Primary osteoarthritis of both knees [M17.0]   Procedure:  Anesthesia, Analgesia, Anxiolysis:  Type: Therapeutic Intra-Articular Hyalgan Knee Injection #1 Region:  Knee Region Level: Knee Joint Laterality: Bilateral  Indications: 1. Primary osteoarthritis of both knees   2. Chronic knee pain (Bilateral)     Pre-procedure Pain Score: 7 or 10 Reported level of pain is compatible with clinical observations Post-procedure Pain Score: 0-No pain  Type: Local Anesthesia Local Anesthetic: Lidocaine 1% Route: Infiltration ([place]/IM) IV Access: Declined Sedation: Declined  Indication(s): Analgesia      Pre-Procedure Assessment:  [name] is a 75 y.o. year old, female patient, seen today for interventional treatment. She has DIABETES MELLITUS, TYPE II; OBESITY, MORBID; ANXIETY; DEPRESSION; Essential hypertension, benign; ASTHMA; GERD; IBS; CKD (chronic kidney disease) stage 3, GFR 30-59 ml/min; ARTHRITIS; SLEEP APNEA; PVC's (premature ventricular contractions); Osteoarthritis of knee (Bilateral); Primary osteoarthritis of knee (Bilateral); Chronic knee pain (Bilateral); Chronic pain; and Spinal cord stimulator status on her problem list.. Her primarily concern today is the Knee Pain   Pain Type: Chronic pain Pain Location: Knee Pain Orientation: Right, Left Pain Descriptors / Indicators: Aching Pain Frequency: Constant  Date  of Last Visit: 08/09/15 Service Provided on Last Visit: Evaluation  Coagulation Parameters Lab Results  Component Value Date   INR 1.01 07/19/2010   LABPROT 13.5 07/19/2010   APTT 29 07/19/2010   PLT 257 06/26/2015    Verification of the correct person, correct site (including marking of site), and correct procedure were performed and confirmed by the patient.  Consent: Secured. Under the influence of no sedatives a written informed consent was obtained, after having provided information on the risks and possible complications. To fulfill our ethical and legal obligations, as recommended by the American Medical Association's Code of Ethics, we have provided information to the patient about our clinical impression; the nature and purpose of the treatment or procedure; the risks, benefits, and possible complications of the intervention; alternatives; the risk(s) and benefit(s) of the alternative treatment(s) or procedure(s); and the risk(s) and benefit(s) of doing nothing. The patient was provided information about the risks and possible complications associated with the procedure. These include, but are not limited to, failure to achieve desired goals, infection, bleeding, organ or nerve damage, allergic reactions, paralysis, and death. In the case of intra- or periarticular procedures these may include, but are not limited to, failure to achieve desired goals, infection, bleeding (hemarthrosis), organ or nerve damage, allergic reactions, and death. In addition, the patient was informed that Medicine is not an exact science; therefore, there is also the possibility of unforeseen risks and possible complications that may result in a catastrophic outcome. The patient indicated having understood very clearly. We have given the patient no guarantees and we have made no promises. Enough time was given to the patient to ask questions, all of which were answered to the patient's satisfaction.  Consent  Attestation: I, the ordering provider, attest that I have discussed with the patient the benefits, risks, side-effects, alternatives, likelihood of  achieving goals, and potential problems during recovery for the procedure that I have provided informed consent.  Pre-Procedure Preparation: Safety Precautions: Allergies reviewed. Appropriate site, procedure, and patient were confirmed by following the Joint Commission's Universal Protocol (UP.01.01.01), in the form of a "Time Out". The patient was asked to confirm marked site and procedure, before commencing. The patient was asked about blood thinners, or active infections, both of which were denied. Patient was assessed for positional comfort and all pressure points were checked before starting procedure. Allergies: She is allergic to hydrocodone and naproxen.. Infection Control Precautions: Sterile technique used. Standard Universal Precautions were taken as recommended by the Department of [facility] for Disease Control and Prevention (CDC). Standard pre-surgical skin prep was conducted. Respiratory hygiene and cough etiquette was practiced. Hand hygiene observed. Safe injection practices and needle disposal techniques followed. SDV (single dose vial) medications used. Medications properly checked for expiration dates and contaminants. Personal protective equipment (PPE) used: Sterile Radiation-resistant gloves. Monitoring:  As per clinic protocol. Filed Vitals:   09/07/15 1407 09/07/15 1448 09/07/15 1455  BP: 131/76 152/65 137/94  Pulse: 93 94 100  Temp: 98 F (36.7 C)    TempSrc: Oral    Resp: 16 18 18   Height: 5' (1.524 m)    Weight: 235 lb (106.595 kg)    SpO2: 100% 90% 100%  Calculated BMI: Body mass index is 45.9 kg/(m^2).  Description of Procedure Process:   Time-out: "Time-out" completed before starting procedure, as per protocol. Position: Sitting Target Area: Knee Joint Approach: Lateral approach. Area Prepped: Entire  knee area, from the mid-thigh to the mid-shin. Prepping solution: ChloraPrep (2% chlorhexidine gluconate and 70% isopropyl alcohol) Safety Precautions: Aspiration looking for blood return was conducted prior to all injections. At no point did we inject any substances, as a needle was being advanced. No attempts were made at seeking any paresthesias. Safe injection practices and needle disposal techniques used. Medications properly checked for expiration dates. SDV (single dose vial) medications used.    Description of the Procedure: Protocol guidelines were followed. The patient was placed in position over the fluoroscopy table. The target area was identified and the area prepped in the usual manner. Skin desensitized using vapocoolant spray. Skin & deeper tissues infiltrated with local anesthetic. Appropriate amount of time allowed to pass for local anesthetics to take effect. The procedure needles were then advanced to the target area. Proper needle placement secured. Negative aspiration confirmed. Solution injected in intermittent fashion, asking for systemic symptoms every 0.5cc of injectate. The needles were then removed and the area cleansed, making sure to leave some of the prepping solution back to take advantage of its long term bactericidal properties. EBL: None Materials & Medications Used:  Needle(s) Used: 22g - 1.5" Needle(s) Medications Administered today: We administered lidocaine (PF), Sodium Hyaluronate, and Sodium Hyaluronate.Please see chart orders for dosing details.  Imaging Guidance:   Type of Imaging Technique: None  Antibiotic Prophylaxis:  Indication(s): No indications identified. Type:  Antibiotics Given (last 72 hours)    None       Post-operative Assessment:   Complications: No immediate post-treatment complications were observed. Disposition: Return to clinic for follow-up evaluation. The patient tolerated the entire procedure well. A repeat set of vitals were taken  after the procedure and the patient was kept under observation following institutional policy, for this type of procedure. The patient was discharged home, once institutional criteria were met. The patient was provided with post-procedure discharge instructions, including a section on how  to identify potential problems. Should any problems arise concerning this procedure, the patient was given instructions to immediately contact us, at any time, without hesitation. In any case, we plan to contact the patient by telephone for a follow-up status report regarding this interventional procedure. Comments:  No additional relevant information.  Medications administered during this visit: We administered lidocaine (PF), Sodium Hyaluronate, and Sodium Hyaluronate.  Prescriptions ordered during this visit: New Prescriptions   No medications on file    Future Appointments Date Time Provider [place]  09/14/2015 1:45 PM Milinda Pointer, MD [facility] None    Primary Care Physician: Robert Bellow, MD Location: [facility] Note by: Kathlen Brunswick. Dossie Arbour, M.D, DABA, DABAPM, DABPM, DABIPP, FIPP  Disclaimer:  Medicine is not an exact science. The only guarantee in medicine is that nothing is guaranteed. It is important to note that the decision to proceed with this intervention was based on the information collected from the patient. The Data and conclusions were drawn from the patient's questionnaire, the interview, and the physical examination. Because the information was provided in large part by the patient, it cannot be guaranteed that it has not been purposely or unconsciously manipulated. Every effort has been made to obtain as much relevant data as possible for this evaluation. It is important to note that the conclusions that lead to this procedure are derived in large part from the available data. Always take into account that the treatment will also be  dependent on availability of resources and existing treatment guidelines, considered by other Pain Management Practitioners as being common knowledge and practice, at the time of the intervention. For Medico-Legal purposes, it is also important to point out that variation in procedural techniques and pharmacological choices are the acceptable norm. The indications, contraindications, technique, and results of the above procedure should only be interpreted and judged by a Board-Certified Interventional Pain Specialist with extensive familiarity and expertise in the same exact procedure and technique. Attempts at providing opinions without similar or greater experience and expertise than that of the treating physician will be considered as inappropriate and unethical, and shall result in a formal complaint to the state medical board and applicable specialty societies. Daisyreviewed the middle ear

## 2015-09-07 NOTE — Patient Instructions (Signed)
Knee Injection A knee injection is a procedure to get medicine into your knee joint. Your health care provider puts a needle into the joint and injects medicine with an attached syringe. The injected medicine may relieve the pain, swelling, and stiffness of arthritis. The injected medicine may also help to lubricate and cushion your knee joint. You may need more than one injection. LET YOUR HEALTH CARE PROVIDER KNOW ABOUT:  Any allergies you have.  All medicines you are taking, including vitamins, herbs, eye drops, creams, and over-the-counter medicines.  Previous problems you or members of your family have had with the use of anesthetics.  Any blood disorders you have.  Previous surgeries you have had.  Any medical conditions you may have. RISKS AND COMPLICATIONS Generally, this is a safe procedure. However, problems may occur, including:  Infection.  Bleeding.  Worsening symptoms.  Damage to the area around your knee.  Allergic reaction to any of the medicines.  Skin reactions from repeated injections. BEFORE THE PROCEDURE  Ask your health care provider about changing or stopping your regular medicines. This is especially important if you are taking diabetes medicines or blood thinners.  Plan to have someone take you home after the procedure. PROCEDURE  You will sit or lie down in a position for your knee to be treated.  The skin over your kneecap will be cleaned with a germ-killing solution (antiseptic).  You will be given a medicine that numbs the area (local anesthetic). You may feel some stinging.  After your knee becomes numb, you will have a second injection. This is the medicine. This needle is carefully placed between your kneecap and your knee. The medicine is injected into the joint space.  At the end of the procedure, the needle will be removed.  A bandage (dressing) may be placed over the injection site. The procedure may vary among health care providers  and hospitals. AFTER THE PROCEDURE  You may have to move your knee through its full range of motion. This helps to get all of the medicine into your joint space.  Your blood pressure, heart rate, breathing rate, and blood oxygen level will be monitored often until the medicines you were given have worn off.  You will be watched to make sure that you do not have a reaction to the injected medicine.   This information is not intended to replace advice given to you by your health care provider. Make sure you discuss any questions you have with your health care provider.   Document Released: 06/11/2006 Document Revised: 04/10/2014 Document Reviewed: 01/28/2014 Else[employer] Interactive Patient Education 2016 [employer] Pain Management Discharge Instructions  General Discharge Instructions :  If you need to reach your doctor call: Monday-Friday 8:00 am - 4:00 pm at [phone] or toll free [phone].  After clinic hours [phone] to have operator reach doctor.  Bring all of your medication bottles to all your appointments in the pain clinic.  To cancel or reschedule your appointment with Pain Management please remember to call 24 hours in advance to avoid a fee.  Refer to the educational materials which you have been given on: General Risks, I had my Procedure. Discharge Instructions, Post Sedation.  Post Procedure Instructions:  The drugs you were given will stay in your system until tomorrow, so for the next 24 hours you should not drive, make any legal decisions or drink any alcoholic beverages.  You may eat anything you prefer, but it is better to start with liquids then   soups and crackers, and gradually work up to solid foods.  Please notify your doctor immediately if you have any unusual bleeding, trouble breathing or pain that is not related to your normal pain.  Depending on the type of procedure that was done, some parts of your body may feel week and/or numb.  This  usually clears up by tonight or the next day.  Walk with the use of an assistive device or accompanied by an adult for the 24 hours.  You may use ice on the affected area for the first 24 hours.  Put ice in a Ziploc bag and cover with a towel and place against area 15 minutes on 15 minutes off.  You may switch to heat after 24 hours. 

## 2015-09-08 ENCOUNTER — Telehealth: Payer: Self-pay | Admitting: *Deleted

## 2015-09-08 NOTE — Telephone Encounter (Signed)
Post procedure call - left message 

## 2015-09-14 ENCOUNTER — Ambulatory Visit: Payer: Medicare HMO | Admitting: Pain Medicine

## 2015-09-22 ENCOUNTER — Encounter: Payer: Self-pay | Admitting: Pain Medicine

## 2015-09-22 ENCOUNTER — Ambulatory Visit: Payer: Medicare HMO | Attending: Pain Medicine | Admitting: Pain Medicine

## 2015-09-22 VITALS — BP 159/85 | HR 84 | Temp 98.4°F | Resp 20 | Ht 60.0 in | Wt 220.0 lb

## 2015-09-22 DIAGNOSIS — M25562 Pain in left knee: Secondary | ICD-10-CM

## 2015-09-22 DIAGNOSIS — M170 Bilateral primary osteoarthritis of knee: Secondary | ICD-10-CM | POA: Diagnosis not present

## 2015-09-22 DIAGNOSIS — G8929 Other chronic pain: Secondary | ICD-10-CM | POA: Diagnosis not present

## 2015-09-22 DIAGNOSIS — G4730 Sleep apnea, unspecified: Secondary | ICD-10-CM | POA: Diagnosis not present

## 2015-09-22 DIAGNOSIS — I129 Hypertensive chronic kidney disease with stage 1 through stage 4 chronic kidney disease, or unspecified chronic kidney disease: Secondary | ICD-10-CM | POA: Diagnosis not present

## 2015-09-22 DIAGNOSIS — E1122 Type 2 diabetes mellitus with diabetic chronic kidney disease: Secondary | ICD-10-CM | POA: Diagnosis not present

## 2015-09-22 DIAGNOSIS — F418 Other specified anxiety disorders: Secondary | ICD-10-CM | POA: Insufficient documentation

## 2015-09-22 DIAGNOSIS — M25561 Pain in right knee: Secondary | ICD-10-CM

## 2015-09-22 DIAGNOSIS — J45909 Unspecified asthma, uncomplicated: Secondary | ICD-10-CM | POA: Insufficient documentation

## 2015-09-22 DIAGNOSIS — K589 Irritable bowel syndrome without diarrhea: Secondary | ICD-10-CM | POA: Diagnosis not present

## 2015-09-22 DIAGNOSIS — N183 Chronic kidney disease, stage 3 (moderate): Secondary | ICD-10-CM | POA: Diagnosis not present

## 2015-09-22 DIAGNOSIS — K219 Gastro-esophageal reflux disease without esophagitis: Secondary | ICD-10-CM | POA: Diagnosis not present

## 2015-09-22 DIAGNOSIS — Z7984 Long term (current) use of oral hypoglycemic drugs: Secondary | ICD-10-CM | POA: Diagnosis not present

## 2015-09-22 NOTE — Progress Notes (Signed)
Patient's Name: [name]  Patient type: Established  MRN: [id number]  Service setting: Ambulatory outpatient  DOB: 1940/05/22  Location: [facility]  DOS: 09/22/2015  Primary Care Physician: Milana Obey, MD  Note by: Sydnee Levans. Laban Emperor, M.D, DABA, DABAPM, DABPM, Olga Coaster, FIPP  Referring Physician: Gareth Morgan, MD  Specialty: Board-Certified Interventional Pain Management  Last Visit to Pain Management: 09/08/2015   Primary Reason(s) for Visit: Encounter for post-procedure evaluation of chronic illness with mild to moderate exacerbation CC: No chief complaint on file.   HPI  [name] is a 75 y.o. year old, female patient, who returns today as an established patient. She has DIABETES MELLITUS, TYPE II; OBESITY, MORBID; ANXIETY; DEPRESSION; Essential hypertension, benign; ASTHMA; GERD; IBS; CKD (chronic kidney disease) stage 3, GFR 30-59 ml/min; ARTHRITIS; SLEEP APNEA; PVC's (premature ventricular contractions); Osteoarthritis of knee (Bilateral); Primary osteoarthritis of knee (Bilateral); Chronic knee pain (Bilateral); Chronic pain; and Spinal cord stimulator status on her problem list.. Her primarily concern today is the No chief complaint on file.   Pain Assessment: Self-Reported Pain Score: 0-No pain Reported level is compatible with observation          The patient comes into the clinics today for post-procedure evaluation on the interventional treatment done on 09/07/2015.  Date of Last Visit: 09/07/15 Service Provided on Last Visit: Procedure (bilateral Hyalgan knee injections)  Post-Procedure Assessment  Procedure done on last visit: Bilateral Hyalgan knee injection #1 under fluoroscopic guidance, no sedation. Side-effects or Adverse reactions: None reported Sedation: No sedation used  Results: Ultra-Short Term Relief (First 1 hour after procedure): 100 %  Analgesia during this period is likely to be Local Anesthetic and/or IV Sedative  (Analgesic/Anxiolitic) related Short Term Relief (Initial 4-6 hrs after procedure): 100 % Complete relief confirms area to be the source of pain Long Term Relief : 100 % Long-term benefit would suggest an inflammatory etiology to the pain   Current Relief (Now): 100%  Persistent relief would suggest effective anti-inflammatory effects from steroids Interpretation of Results: The results of this procedure would suggest the patient to be an excellent candidate for a palliative therapy using this modality. The patient will be set up for PRN injections.  Laboratory Chemistry  Inflammation Markers No results found for: ESRSEDRATE, CRP  Renal Function Lab Results  Component Value Date   BUN 12 06/26/2015   CREATININE 1.41* 06/26/2015   GFRAA 41* 06/26/2015   GFRNONAA 35* 06/26/2015    Hepatic Function Lab Results  Component Value Date   AST 37 02/20/2015   ALT 26 02/20/2015   ALBUMIN 3.9 02/20/2015    Electrolytes Lab Results  Component Value Date   NA 140 06/26/2015   K 4.1 06/26/2015   CL 108 06/26/2015   CALCIUM 8.7* 06/26/2015    Pain Modulating Vitamins No results found for: VD25OH, VD125OH2TOT, WU9811BJ4, NW2956OZ3, VITAMINB12  Coagulation Parameters Lab Results  Component Value Date   INR 1.01 07/19/2010   LABPROT 13.5 07/19/2010   APTT 29 07/19/2010   PLT 257 06/26/2015    Note: Labs Reviewed.  Recent Diagnostic Imaging  Dg Chest 2 View  06/26/2015  CLINICAL DATA:  COUGH, Pt. C/o body aches, fever, diarrhea x 2 days. Denies vomiting. Pt reports cough as well. HISTORY OF ASTHMA, DM, HTN, SPINAL CORD STIMULATOR caps EXAM: CHEST  2 VIEW COMPARISON:  Radiograph 02/20/2015, CT 04/30/2007 FINDINGS: Normal cardiac silhouette. Chronic elevation of the LEFT hemidiaphragm. No evidence of effusion, infiltrate, or pneumothorax. Spinal stimulation electrodesin  the spinal canal of the thoracic spine. IMPRESSION: No acute cardiopulmonary process. Chronic elevation LEFT  hemidiaphragm Electronically Signed   By: Genevive BiStewart  Edmunds M.D.   On: 06/26/2015 16:19    Meds  The patient has a current medication list which includes the following prescription(s): accu-chek aviva plus, albuterol, alendronate, amlodipine, benzonatate, vitamin d3, colchicine, cyanocobalamin, dextromethorphan-guaifenesin, dicyclomine, doxepin, furosemide, gabapentin, glimepiride, guaifenesin-dextromethorphan, levothyroxine, levothyroxine, losartan, meloxicam, metformin, omeprazole, proair hfa, probiotic product, promethazine, sucralfate, tramadol, and vitamin d (ergocalciferol).  Current Outpatient Prescriptions on File Prior to Visit  Medication Sig  . ACCU-CHEK AVIVA PLUS test strip   . albuterol (PROVENTIL) (2.5 MG/3ML) 0.083% nebulizer solution Take 3 mLs (2.5 mg total) by nebulization every 6 (six) hours as needed for wheezing or shortness of breath.  Marland Kitchen. alendronate (FOSAMAX) 70 MG tablet Take 70 mg by mouth every Wednesday. Take with a full glass of water on an empty stomach.  Marland Kitchen. amLODipine (NORVASC) 10 MG tablet Take 10 mg by mouth daily.  . benzonatate (TESSALON) 100 MG capsule Take 1 capsule (100 mg total) by mouth every 8 (eight) hours.  . Cholecalciferol (VITAMIN D3) 5000 UNITS CAPS Take 5,000 Units by mouth daily.  . colchicine 0.6 MG tablet Take 1 tablet (0.6 mg total) by mouth every 6 (six) hours. (Patient taking differently: Take 0.6 mg by mouth daily. )  . Cyanocobalamin (VITAMIN B 12 PO) Take 1 tablet by mouth daily.  Marland Kitchen. dextromethorphan-guaiFENesin (MUCINEX DM) 30-600 MG 12hr tablet Take 1 tablet by mouth as needed. Reported on 09/07/2015  . dicyclomine (BENTYL) 10 MG capsule TAKE 1 CAPSULE BY MOUTH BEFORE MEALS AND AT BEDTIME AS NEEDED FOR ABDOMINAL CRAMPS AND DIARRHEA.  Marland Kitchen. doxepin (SINEQUAN) 25 MG capsule Take 75 mg by mouth at bedtime.   . furosemide (LASIX) 20 MG tablet Take 20 mg by mouth daily as needed for fluid.   Marland Kitchen. gabapentin (NEURONTIN) 100 MG capsule Take 100 mg by mouth 2  (two) times daily as needed (pain).  Marland Kitchen. glimepiride (AMARYL) 4 MG tablet Take 4 mg by mouth daily.   Marland Kitchen. guaiFENesin-dextromethorphan (ROBITUSSIN DM) 100-10 MG/5ML syrup Take 5 mLs by mouth every 4 (four) hours as needed for cough. Reported on 09/07/2015  . levothyroxine (SYNTHROID, LEVOTHROID) 25 MCG tablet Take 25 mcg by mouth daily. Reported on 09/07/2015  . levothyroxine (SYNTHROID, LEVOTHROID) 75 MCG tablet Take 75 mcg by mouth daily.  Marland Kitchen. losartan (COZAAR) 25 MG tablet Take 25 mg by mouth daily.   . meloxicam (MOBIC) 7.5 MG tablet Take 1 tablet (7.5 mg total) by mouth daily.  . metFORMIN (GLUCOPHAGE) 500 MG tablet Take 1,000 mg by mouth 2 (two) times daily with a meal.   . omeprazole (PRILOSEC) 20 MG capsule Take 20 mg by mouth 2 (two) times daily.   Marland Kitchen. PROAIR HFA 108 (90 Base) MCG/ACT inhaler   . Probiotic Product (PROBIOTIC DAILY PO) Take 1 tablet by mouth daily.  . promethazine (PHENERGAN) 25 MG tablet Take 25 mg by mouth 2 (two) times daily as needed for nausea.   . sucralfate (CARAFATE) 1 G tablet Take 1 tablet (1 g total) by mouth 4 (four) times daily -  with meals and at bedtime. Prn for indigestion (Patient taking differently: Take 1 g by mouth 2 (two) times daily. Prn for indigestion)  . traMADol (ULTRAM) 50 MG tablet Take 50 mg by mouth daily as needed for moderate pain.   . Vitamin D, Ergocalciferol, (DRISDOL) 50000 UNITS CAPS capsule Take 50,000 Units by mouth every Wednesday.  No current facility-administered medications on file prior to visit.    ROS  Constitutional: Denies any fever or chills Gastrointestinal: No reported hemesis, hematochezia, vomiting, or acute GI distress Musculoskeletal: Denies any acute onset joint swelling, redness, loss of ROM, or weakness Neurological: No reported episodes of acute onset apraxia, aphasia, dysarthria, agnosia, amnesia, paralysis, loss of coordination, or loss of consciousness  Allergies  [name] is allergic to hydrocodone and  naproxen.  PFSH  Medical:  [name]  has a past medical history of Asthma; Depression; Type 2 diabetes mellitus with diabetic neuropathy (HCC); GERD (gastroesophageal reflux disease); Essential hypertension, benign; Low back pain; IBS (irritable bowel syndrome); History of recurrent UTIs; Cataract; Glaucoma; Arthritis; migraines; Carpal tunnel syndrome; Morbid obesity (HCC); Sleep apnea; colonic polyps; Anxiety; Carotid artery aneurysm (HCC); Neurogenic bladder; Osteoporosis; Hypothyroidism; Stroke [facility]); and CKD (chronic kidney disease) stage 3, GFR 30-59 ml/min. Family: family history includes Cancer in her brother, brother, mother, and sister; Diabetes in her brother; Heart attack in her sister; Heart disease in her mother. There is no history of Colon cancer or Colon polyps. Surgical:  has past surgical history that includes Vesicovaginal fistula closure w/ TAH; Carpal tunnel release - bilateral (1992); Total hip replacement - right (2002); Stomach surgery gastropexy for gastric volvulus (2009); Tubular Adenoma; Shoulder surgery; Spinal cord stimulator insertion; Esophagogastroduodenoscopy (08/12/2007); Colonoscopy (N/A, 03/04/2013); Abdominal hysterectomy; Colonoscopy (06/14/2006); and Cerebral aneurysm repair (2004). Tobacco:  reports that she has quit smoking. Her smoking use included Cigarettes. She has never used smokeless tobacco. Alcohol:  reports that she does not drink alcohol. Drug:  reports that she does not use illicit drugs.  Constitutional Exam  Vitals: Blood pressure 159/85, pulse 84, temperature 98.4 F (36.9 C), resp. rate 20, height 5' (1.524 m), weight 220 lb (99.791 kg), SpO2 100 %. General appearance: Well nourished, well developed, and well hydrated. In no acute distress Calculated BMI/Body habitus: Body mass index is 42.97 kg/(m^2). (>40 kg/m2) Extreme obesity (Class III) - 254% higher incidence of chronic pain Psych/Mental status: Alert and oriented x 3 (person, place,  & time) Eyes: PERLA Respiratory: No evidence of acute respiratory distress  Cervical Spine Exam  Inspection: No masses, redness, or swelling Alignment: Symmetrical ROM: Functional: ROM is within functional limits [facility]) Stability: No instability detected Muscle strength & Tone: Functionally intact Sensory: Unimpaired Palpation: No complaints of tenderness  Upper Extremity (UE) Exam    Side: Right upper extremity  Side: Left upper extremity  Inspection: No masses, redness, swelling, or asymmetry  Inspection: No masses, redness, swelling, or asymmetry  ROM:  ROM:  Functional: ROM is within functional limits [facility])  Functional: ROM is within functional limits [facility])  Muscle strength & Tone: Functionally intact  Muscle strength & Tone: Functionally intact  Sensory: Unimpaired  Sensory: Unimpaired  Palpation: Non-contributory  Palpation: Non-contributory   Thoracic Spine Exam  Inspection: No masses, redness, or swelling Alignment: Symmetrical ROM: Functional: ROM is within functional limits [facility]) Stability: No instability detected Sensory: Unimpaired Muscle strength & Tone: Functionally intact Palpation: No complaints of tenderness  Lumbar Spine Exam  Inspection: No masses, redness, or swelling Alignment: Symmetrical ROM: Functional: ROM is within functional limits [facility]) Stability: No instability detected Muscle strength & Tone: Functionally intact Sensory: Unimpaired Palpation: No complaints of tenderness Provocative Tests: Lumbar Hyperextension and rotation test: deferred Patrick's Maneuver: deferred  Gait & Posture Assessment  Ambulation: Unassisted Gait: Unaffected Posture: WNL  Lower Extremity Exam    Side: Right lower extremity  Side: Left lower extremity  Inspection: No masses, redness, swelling, or asymmetry ROM:  Inspection: No masses, redness, swelling, or asymmetry ROM:  Functional: ROM is within functional limits [facility])  Functional: ROM is within functional  limits [facility])  Muscle strength & Tone: Functionally intact  Muscle strength & Tone: Functionally intact  Sensory: Unimpaired  Sensory: Unimpaired  Palpation: Non-contributory  Palpation: Non-contributory   Assessment & Plan  Primary Diagnosis & Pertinent Problem List: The primary encounter diagnosis was Chronic knee pain (Bilateral). Diagnoses of Primary osteoarthritis of both knees and Primary osteoarthritis of knee (Bilateral) were also pertinent to this visit.  Visit Diagnosis: 1. Chronic knee pain (Bilateral)   2. Primary osteoarthritis of both knees   3. Primary osteoarthritis of knee (Bilateral)     Problem-specific Plan(s): No problem-specific assessment & plan notes found for this encounter.   Plan of Care   Problem List Items Addressed This Visit      High   Chronic knee pain (Bilateral) - Primary (Chronic)   Relevant Orders   MR Knee Left  Wo Contrast   KNEE INJECTION   Osteoarthritis of knee (Bilateral) (Chronic)   Relevant Orders   MR Knee Left  Wo Contrast   KNEE INJECTION   Primary osteoarthritis of knee (Bilateral) (Chronic)   Relevant Orders   MR Knee Left  Wo Contrast   KNEE INJECTION       Pharmacotherapy (Medications Ordered): No orders of the defined types were placed in this encounter.    Lab-work & Procedure Ordered: Orders Placed This Encounter  Procedures  . KNEE INJECTION    Please order Hyalgan.    Standing Status: Future     Number of Occurrences:      Standing Expiration Date: 09/21/2016    Scheduling Instructions:     Side: Bilateral     Sedation: No Sedation.     Timeframe: PRN Procedure. Patient will call.    Order Specific Question:  Where will this procedure be performed?    Answer:  [facility]  . MR Knee Left  Wo Contrast    Standing Status: Future     Number of Occurrences:      Standing Expiration Date: 09/21/2016    Scheduling Instructions:     Patient presents with palpable painful mass on the posterior medial  aspect of the knee.    Order Specific Question:  Reason for Exam (SYMPTOM  OR DIAGNOSIS REQUIRED)    Answer:  Left knee pain/arthralgia    Order Specific Question:  Preferred imaging location?    Answer:  [facility]    Order Specific Question:  Does the patient have a pacemaker or implanted devices?    Answer:  No    Order Specific Question:  What is the patient's sedation requirement?    Answer:  No Sedation    Order Specific Question:  Call Results- Best Contact Number?    Answer:  [phone] (Pain Clinic facility) (Dr. Laban Emperor)    Imaging Ordered: MR KNEE LEFT  WO CONTRAST  Interventional Therapies: Scheduled:  None at this time.    Considering:  Completing series of 5 Hyalgan knee injections  (Bilateral).    PRN Procedures:  Bilateral Hyalgan knee injection #2 under fluoroscopic guidance, no sedation.    Referral(s) or Consult(s): None at this time.  Medications administered during this visit: Ms. Yera had no medications administered during this visit.  Requested PM Follow-up: Return for Procedure (PRN - Patient will call).  No future appointments.  Primary Care Physician:  Milana Obey, MD Location: [facility] Note by: Sydnee Levans Laban Emperor, M.D, DABA, DABAPM, DABPM, DABIPP, FIPP  Pain Score Disclaimer: We use the NRS-11 scale. This is a self-reported, subjective measurement of pain severity with only modest accuracy. It is used primarily to identify changes within a particular patient. It must be understood that outpatient pain scales are significantly less accurate that those used for research, where they can be applied under ideal controlled circumstances with minimal exposure to variables. In reality, the score is likely to be a combination of pain intensity and pain affect, where pain affect describes the degree of emotional arousal or changes in action readiness caused by the sensory experience of pain. Factors such as  social and work situation, setting, emotional state, anxiety levels, expectation, and prior pain experience may influence pain perception and show large inter-individual differences that may also be affected by time variables.  Patient instructions provided during this appointment: There are no Patient Instructions on file for this visit.

## 2015-09-22 NOTE — Progress Notes (Signed)
Safety precautions to be maintained throughout the outpatient stay will include: orient to surroundings, keep bed in low position, maintain call bell within reach at all times, provide assistance with transfer out of bed and ambulation.  

## 2015-10-13 ENCOUNTER — Other Ambulatory Visit: Payer: Self-pay | Admitting: *Deleted

## 2015-10-13 MED ORDER — COLCHICINE 0.6 MG PO TABS: 0.6000 mg | ORAL_TABLET | Freq: Four times a day (QID) | ORAL | Status: DC

## 2015-12-03 ENCOUNTER — Telehealth: Payer: Self-pay | Admitting: Gastroenterology

## 2015-12-03 NOTE — Telephone Encounter (Signed)
Pt called to say that she is out of town and needs her prescription of [facility]yl [facility] [place] [place] and their number is [phone].

## 2015-12-03 NOTE — Telephone Encounter (Signed)
Forwarding to refill box.  

## 2015-12-08 MED ORDER — DICYCLOMINE HCL 10 MG PO CAPS: 10.0000 mg | ORAL_CAPSULE | Freq: Three times a day (TID) | ORAL | 0 refills | Status: DC

## 2015-12-08 NOTE — Addendum Note (Signed)
Addended by: Gelene MinkBOONE, Alsha Meland W on: 12/08/2015 12:33 PM   Modules accepted: Orders

## 2015-12-08 NOTE — Telephone Encounter (Signed)
I called the phone number listed and got the fax number of [phone] and faxed the Rx. To them.

## 2015-12-08 NOTE — Telephone Encounter (Signed)
I have printed prescription. Please fax to pharmacy. Thanks!

## 2015-12-31 ENCOUNTER — Encounter: Payer: Self-pay | Admitting: Orthopedic Surgery

## 2015-12-31 ENCOUNTER — Ambulatory Visit (INDEPENDENT_AMBULATORY_CARE_PROVIDER_SITE_OTHER): Payer: Medicare HMO | Admitting: Orthopedic Surgery

## 2015-12-31 VITALS — BP 142/79 | HR 69 | Wt 225.0 lb

## 2015-12-31 DIAGNOSIS — M25561 Pain in right knee: Secondary | ICD-10-CM

## 2015-12-31 DIAGNOSIS — M170 Bilateral primary osteoarthritis of knee: Secondary | ICD-10-CM

## 2015-12-31 DIAGNOSIS — M25562 Pain in left knee: Secondary | ICD-10-CM

## 2015-12-31 NOTE — Progress Notes (Signed)
Patient ID: [name], female   DOB: 06/28/1940, 75 y.o.   MRN: [id number]  Chief Complaint  Patient presents with  . Follow-up    BILATERAL KNEES, REQUESTS INJECTIONS    HPI [name] is a 75 y.o. female.   HPI  Bilateral knee pain   Review of Systems Review of Systems  Left shoulder pain   Physical Exam BP (!) 142/79   Pulse 69   Wt 225 lb (102.1 kg)   BMI 43.94 kg/m    Physical Exam  No infection seen in knees    Procedure note left knee injection verbal consent was obtained to inject left knee joint  Timeout was completed to confirm the site of injection  The medications used were 40 mg of Depo-Medrol and 1% lidocaine 3 cc  Anesthesia was provided by ethyl chloride and the skin was prepped with alcohol.  After cleaning the skin with alcohol a 20-gauge needle was used to inject the left knee joint. There were no complications. A sterile bandage was applied.   Procedure note right knee injection verbal consent was obtained to inject right knee joint  Timeout was completed to confirm the site of injection  The medications used were 40 mg of Depo-Medrol and 1% lidocaine 3 cc  Anesthesia was provided by ethyl chloride and the skin was prepped with alcohol.  After cleaning the skin with alcohol a 20-gauge needle was used to inject the right knee joint. There were no complications. A sterile bandage was applied.

## 2016-01-07 ENCOUNTER — Ambulatory Visit: Payer: Medicare HMO | Admitting: Orthopedic Surgery

## 2016-01-21 ENCOUNTER — Ambulatory Visit (INDEPENDENT_AMBULATORY_CARE_PROVIDER_SITE_OTHER): Payer: Medicare HMO | Admitting: Orthopedic Surgery

## 2016-01-21 ENCOUNTER — Encounter: Payer: Self-pay | Admitting: Orthopedic Surgery

## 2016-01-21 DIAGNOSIS — M75102 Unspecified rotator cuff tear or rupture of left shoulder, not specified as traumatic: Secondary | ICD-10-CM

## 2016-01-21 DIAGNOSIS — M19012 Primary osteoarthritis, left shoulder: Secondary | ICD-10-CM | POA: Diagnosis not present

## 2016-01-21 DIAGNOSIS — M75112 Incomplete rotator cuff tear or rupture of left shoulder, not specified as traumatic: Secondary | ICD-10-CM

## 2016-01-21 MED ORDER — TRAMADOL HCL 50 MG PO TABS: 50.0000 mg | ORAL_TABLET | Freq: Every day | ORAL | 2 refills | Status: DC | PRN

## 2016-01-21 NOTE — Patient Instructions (Signed)

## 2016-01-21 NOTE — Progress Notes (Signed)
Patient ID: [name], female   DOB: 12/28/1940, 75 y.o.   MRN: [id number]  Chief Complaint  Patient presents with  . Follow-up    Recheck Left shoulder.    HPI [name] is a 75 y.o. female.   HPI  Patient is an MRI and one injection for left shoulder pain. MRI shows no full-thickness tear but some intrasubstance tearing of the supraspinatus tendon and glenohumeral arthritis.  She is now living in [place]  Comes in for reevaluation of the left shoulder, complaining of pain and lack of motion  Review of Systems Review of Systems  Respiratory: Negative for shortness of breath.   Cardiovascular: Negative for chest pain.  Musculoskeletal: Positive for arthralgias.     Physical Exam There were no vitals taken for this visit.   Physical Exam  Her active range of motion is only 90 abduction. Passive range of motion is painful but I can get the shoulder to flex in the scapular plane 120. External rotation remains normal. No instability noted on abduction external rotation she has weakness of the rotator cuff grade 4 skin warm dry and intact pulses normal lymph nodes normal in the left axilla  Impression Encounter Diagnoses  Name Primary?  Marland Kitchen. Arthritis of left shoulder region Yes  . Rotator cuff syndrome of left shoulder   . Incomplete tear of left rotator cuff     Recommend physical therapy repeat injection  Follow-up as needed  Procedure note the subacromial injection shoulder left   Verbal consent was obtained to inject the  Left   Shoulder  Timeout was completed to confirm the injection site is a subacromial space of the  left  shoulder  Medication used Depo-Medrol 40 mg and lidocaine 1% 3 cc  Anesthesia was provided by ethyl chloride  The injection was performed in the left  posterior subacromial space. After pinning the skin with alcohol and anesthetized the skin with ethyl chloride the subacromial space was injected using a 20-gauge needle. There were  no complications  Sterile dressing was applied.

## 2016-01-24 ENCOUNTER — Other Ambulatory Visit: Payer: Self-pay | Admitting: Gastroenterology

## 2016-02-21 ENCOUNTER — Other Ambulatory Visit: Payer: Self-pay | Admitting: *Deleted

## 2016-02-21 DIAGNOSIS — M170 Bilateral primary osteoarthritis of knee: Secondary | ICD-10-CM

## 2016-02-21 MED ORDER — MELOXICAM 7.5 MG PO TABS: 7.5000 mg | ORAL_TABLET | Freq: Every day | ORAL | 5 refills | Status: DC

## 2016-06-27 ENCOUNTER — Telehealth: Payer: Self-pay | Admitting: Orthopedic Surgery

## 2016-06-27 ENCOUNTER — Telehealth: Payer: Self-pay

## 2016-06-27 NOTE — Telephone Encounter (Signed)
WILL ADDRESS AT TIME OF OFFICE VISIT

## 2016-06-27 NOTE — Telephone Encounter (Signed)
Patient states she has been having pain in same shoulder and arm for about 2 weeks.  Per her last office note [id number], note indicates patient was to call office if pain continues, for further instructions. We have scheduled her the next available slot for Dr Romeo AppleHarrison, 07/17/16.  Please advise at her primary ph#[phone]

## 2016-06-27 NOTE — Telephone Encounter (Signed)
Pt is calling because she is having diarrhea and she is wanting to have something called into her pharmacy. [place] She has been having the diarrhea (going about 4-7 times a day) since the TCS and cramping in her stomach. I asked her to call that doctor back and she said that she did and they will not help her. They gave her some medication but it has not helped. [facility] [place]. Please advise

## 2016-06-27 NOTE — Telephone Encounter (Signed)
Relayed to patient.

## 2016-06-27 NOTE — Telephone Encounter (Signed)
AGRRE TAKE TYLENOL AND USE HEAT UNTIL I SEE HER ON THE 16TH

## 2016-06-28 NOTE — Telephone Encounter (Signed)
PLEASE CALL PT. I cannot send a prescription in for her because she hasn't been seen here in over a year. Since she recently established pt care Dr. Lovell SheehanJenkins, she should make an appt to see her or we can schedule her for our next available appt.

## 2016-06-29 NOTE — Telephone Encounter (Signed)
Tried to call with no answer  

## 2016-07-06 NOTE — Telephone Encounter (Signed)
Tried to call with no answer  

## 2016-07-10 NOTE — Telephone Encounter (Signed)
Tried to call patient with no answer. Letter mailed

## 2016-07-10 NOTE — Telephone Encounter (Signed)
Pt called back and she said that she would follow up with a GI in [place]

## 2016-07-17 ENCOUNTER — Encounter: Payer: Self-pay | Admitting: Orthopedic Surgery

## 2016-07-17 ENCOUNTER — Ambulatory Visit (INDEPENDENT_AMBULATORY_CARE_PROVIDER_SITE_OTHER): Payer: Medicare Other | Admitting: Orthopedic Surgery

## 2016-07-17 DIAGNOSIS — M7502 Adhesive capsulitis of left shoulder: Secondary | ICD-10-CM | POA: Diagnosis not present

## 2016-07-17 MED ORDER — GABAPENTIN 100 MG PO CAPS: 100.0000 mg | ORAL_CAPSULE | Freq: Two times a day (BID) | ORAL | 2 refills | Status: DC | PRN

## 2016-07-17 MED ORDER — HYDROCODONE-ACETAMINOPHEN 5-325 MG PO TABS: 1.0000 | ORAL_TABLET | Freq: Four times a day (QID) | ORAL | 0 refills | Status: DC | PRN

## 2016-07-17 NOTE — Progress Notes (Signed)
Patient ID: [name], female   DOB: [birth year], 77 y.o.   MRN: [id number]  Chief Complaint  Patient presents with  . Follow-up    left shoulder pain, requests injection    HPI [name] is a 76 y.o. female.  Presents with a 1-2 month history of aching pain and stiffness with loss of motion and weakness in the left shoulder no history of trauma  Previous treated for bursitis had an MRI which showed interstitial but not full thickness rotator cuff tear  She got an injection she comes back in with increasing pain swelling stiffness  Review of Systems Review of Systems (2 MINIMUM) There is no numbness or tingling no rash on the skin no fever   Past Medical History:  Diagnosis Date  . Anxiety   . Arthritis   . Asthma   . Carotid artery aneurysm [facility])    Right s/p endovascular treatment 2004  . Carpal tunnel syndrome   . Cataract   . CKD (chronic kidney disease) stage 3, GFR 30-59 ml/min   . Depression   . Essential hypertension, benign   . GERD (gastroesophageal reflux disease)   . Glaucoma   . History of recurrent UTIs   . Hx of colonic polyps   . Hx of migraines   . Hypothyroidism   . IBS (irritable bowel syndrome)   . Low back pain   . Morbid obesity (HCC)   . Neurogenic bladder   . Osteoporosis   . Sleep apnea    Intolerant to CPAP   . Stroke (HCC)   . Type 2 diabetes mellitus with diabetic neuropathy [facility])     Past Surgical History:  Procedure Laterality Date  . ABDOMINAL HYSTERECTOMY    . Carpal tunnel release - bilateral  1992  . CEREBRAL ANEURYSM REPAIR  2004   Coil   . COLONOSCOPY N/A 03/04/2013   Procedure: COLONOSCOPY;  Surgeon: West Bali, MD;  Location: [facility];  Service: Endoscopy;  Laterality: N/A;  11:30-moved to 12:35 Soledad Gerlach to notify pt  . COLONOSCOPY  06/14/2006   WUJ:WJXBJY ADENOMAS(2), pTICS, [place] lipoma  . ESOPHAGOGASTRODUODENOSCOPY  08/12/2007   NWG:NFAOZH esophagus without evidence of Barrett's,mass, erosion  ulceration or stricture.  The GE junction was at 40 cm.  The Bravo  capsule was placed at 34 cm/ Unable to appreciate a hiatal hernia in the retroflexed viewNormal duodenal bulb and second portion of the duodenum. mild gastritis.   Marland Kitchen SHOULDER SURGERY     Rotator cuff repair, 2012  . SPINAL CORD STIMULATOR INSERTION    . Stomach surgery gastropexy for gastric volvulus  2009  . Total hip replacement - right  2002  . Tubular Adenoma    . VESICOVAGINAL FISTULA CLOSURE W/ TAH      Social History Social History  Substance Use Topics  . Smoking status: Former Smoker    Types: Cigarettes  . Smokeless tobacco: Never Used  . Alcohol use No    Allergies  Allergen Reactions  . Hydrocodone Itching and Nausea And Vomiting  . Naproxen Other (See Comments)    Hallucinations    Current Meds  Medication Sig  . ACCU-CHEK AVIVA PLUS test strip   . albuterol (PROVENTIL) (2.5 MG/3ML) 0.083% nebulizer solution Take 3 mLs (2.5 mg total) by nebulization every 6 (six) hours as needed for wheezing or shortness of breath.  Marland Kitchen alendronate (FOSAMAX) 70 MG tablet Take 70 mg by mouth every Wednesday. Take with a full glass of water  on an empty stomach.  Marland Kitchen amLODipine (NORVASC) 10 MG tablet Take 10 mg by mouth daily.  . benzonatate (TESSALON) 100 MG capsule Take 1 capsule (100 mg total) by mouth every 8 (eight) hours.  . Cholecalciferol (VITAMIN D3) 5000 UNITS CAPS Take 5,000 Units by mouth daily.  . colchicine 0.6 MG tablet Take 1 tablet (0.6 mg total) by mouth every 6 (six) hours.  . Cyanocobalamin (VITAMIN B 12 PO) Take 1 tablet by mouth daily.  Marland Kitchen dextromethorphan-guaiFENesin (MUCINEX DM) 30-600 MG 12hr tablet Take 1 tablet by mouth as needed. Reported on 09/07/2015  . dicyclomine (BENTYL) 10 MG capsule Take 1 capsule (10 mg total) by mouth 2 (two) times daily.  Marland Kitchen doxepin (SINEQUAN) 25 MG capsule Take 75 mg by mouth at bedtime.   . furosemide (LASIX) 20 MG tablet Take 20 mg by mouth daily as needed for fluid.    Marland Kitchen gabapentin (NEURONTIN) 100 MG capsule Take 100 mg by mouth 2 (two) times daily as needed (pain).  Marland Kitchen glimepiride (AMARYL) 4 MG tablet Take 4 mg by mouth daily.   Marland Kitchen guaiFENesin-dextromethorphan (ROBITUSSIN DM) 100-10 MG/5ML syrup Take 5 mLs by mouth every 4 (four) hours as needed for cough. Reported on 09/07/2015  . levothyroxine (SYNTHROID, LEVOTHROID) 25 MCG tablet Take 25 mcg by mouth daily. Reported on 09/07/2015  . levothyroxine (SYNTHROID, LEVOTHROID) 75 MCG tablet Take 75 mcg by mouth daily.  Marland Kitchen losartan (COZAAR) 25 MG tablet Take 25 mg by mouth daily.   . meloxicam (MOBIC) 7.5 MG tablet Take 1 tablet (7.5 mg total) by mouth daily.  . metFORMIN (GLUCOPHAGE) 500 MG tablet Take 1,000 mg by mouth 2 (two) times daily with a meal.   . omeprazole (PRILOSEC) 20 MG capsule Take 20 mg by mouth 2 (two) times daily.   Marland Kitchen PROAIR HFA 108 (90 Base) MCG/ACT inhaler   . Probiotic Product (PROBIOTIC DAILY PO) Take 1 tablet by mouth daily.  . promethazine (PHENERGAN) 25 MG tablet Take 25 mg by mouth 2 (two) times daily as needed for nausea.   . sucralfate (CARAFATE) 1 G tablet Take 1 tablet (1 g total) by mouth 4 (four) times daily -  with meals and at bedtime. Prn for indigestion (Patient taking differently: Take 1 g by mouth 2 (two) times daily. Prn for indigestion)  . traMADol (ULTRAM) 50 MG tablet Take 1 tablet (50 mg total) by mouth daily as needed for moderate pain.  . Vitamin D, Ergocalciferol, (DRISDOL) 50000 UNITS CAPS capsule Take 50,000 Units by mouth every Wednesday.      Physical Exam Physical Exam 1.There were no vitals taken for this visit.  2. Gen. appearance. The patient is well-developed and well-nourished, grooming and hygiene are normal. There are no gross congenital abnormalities  3. The patient is alert and oriented to person place and time  4. Mood and affect are normal  5. Ambulation Remains normal   Examination reveals the following: 6. On inspection we find no warmth or  tenderness over the joint  She exhibits 20 of external rotation with the arm at the side 15 of active abduction 15 of passive abduction 15 of passive flexion  The joint is stable to inferior subluxation test her internal/external rotation strength is normal  The skin is intact  The pulses are good lymph nodes are negative sensation is normal  Right shoulder motion is 115 of flexion  MEDICAL DECISION MAKING:    Data Reviewed Previous x-rays did show glenohumeral arthritis  Assessment Encounter Diagnosis  Name Primary?  . Adhesive capsulitis of left shoulder Yes     Plan Recommend injection and physical therapy. I placed her on gabapentin she is allergic to codeine and hydrocodone and she didn't want tramadol she settled on gabapentin  Follow-up as needed  Procedure note the subacromial injection shoulder left   Verbal consent was obtained to inject the  Left   Shoulder  Timeout was completed to confirm the injection site is a subacromial space of the  left  shoulder  Medication used Depo-Medrol 40 mg and lidocaine 1% 3 cc  Anesthesia was provided by ethyl chloride  The injection was performed in the left  posterior subacromial space. After pinning the skin with alcohol and anesthetized the skin with ethyl chloride the subacromial space was injected using a 20-gauge needle. There were no complications  Sterile dressing was applied.          Terrilee Dudzik 07/17/2016, 4:47 PM

## 2016-07-17 NOTE — Patient Instructions (Signed)

## 2016-09-01 ENCOUNTER — Other Ambulatory Visit (HOSPITAL_COMMUNITY): Payer: Self-pay | Admitting: Family Medicine

## 2016-09-01 ENCOUNTER — Ambulatory Visit (HOSPITAL_COMMUNITY)
Admission: RE | Admit: 2016-09-01 | Discharge: 2016-09-01 | Disposition: A | Discharge status: Home / Self Care | Payer: Medicare Other | Source: Ambulatory Visit | Attending: Family Medicine | Admitting: Family Medicine

## 2016-09-01 DIAGNOSIS — M25512 Pain in left shoulder: Secondary | ICD-10-CM

## 2016-09-01 DIAGNOSIS — M19012 Primary osteoarthritis, left shoulder: Secondary | ICD-10-CM | POA: Insufficient documentation

## 2016-09-01 DIAGNOSIS — G8929 Other chronic pain: Secondary | ICD-10-CM | POA: Diagnosis present

## 2016-09-04 ENCOUNTER — Ambulatory Visit (INDEPENDENT_AMBULATORY_CARE_PROVIDER_SITE_OTHER): Payer: Medicare Other | Admitting: Gastroenterology

## 2016-09-04 ENCOUNTER — Encounter: Payer: Self-pay | Admitting: Orthopedic Surgery

## 2016-09-04 ENCOUNTER — Telehealth: Payer: Self-pay | Admitting: Orthopedic Surgery

## 2016-09-04 ENCOUNTER — Encounter: Payer: Self-pay | Admitting: Gastroenterology

## 2016-09-04 ENCOUNTER — Ambulatory Visit (INDEPENDENT_AMBULATORY_CARE_PROVIDER_SITE_OTHER): Payer: Medicare Other | Admitting: Orthopedic Surgery

## 2016-09-04 VITALS — BP 165/89 | HR 78 | Wt 215.0 lb

## 2016-09-04 VITALS — BP 158/89 | HR 83 | Temp 97.1°F | Ht 60.0 in | Wt 215.2 lb

## 2016-09-04 DIAGNOSIS — K580 Irritable bowel syndrome with diarrhea: Secondary | ICD-10-CM | POA: Diagnosis not present

## 2016-09-04 DIAGNOSIS — M75122 Complete rotator cuff tear or rupture of left shoulder, not specified as traumatic: Secondary | ICD-10-CM | POA: Diagnosis not present

## 2016-09-04 DIAGNOSIS — K219 Gastro-esophageal reflux disease without esophagitis: Secondary | ICD-10-CM | POA: Diagnosis not present

## 2016-09-04 MED ORDER — HYDROMORPHONE HCL 2 MG PO TABS: 2.0000 mg | ORAL_TABLET | ORAL | 0 refills | Status: DC | PRN

## 2016-09-04 MED ORDER — DICYCLOMINE HCL 10 MG PO CAPS: ORAL_CAPSULE | ORAL | 1 refills | Status: AC

## 2016-09-04 NOTE — Assessment & Plan Note (Signed)
Symptoms well controlled on omeprazole. Continue current regimen. No longer on Carafate. Return to the office in 3 months to see Dr. Darrick Pennafields.

## 2016-09-04 NOTE — Patient Instructions (Signed)
1. Increase Bentyl to at least twice daily 30 minutes before a meal but on days you are having more cramping and loose stools you should take a third dose. HOLD FOR CONSTIPATION. 2. If no better in 2 weeks, then call and we will try pancreatic enzymes.  3. Return for follow up in three months.

## 2016-09-04 NOTE — Assessment & Plan Note (Signed)
Increased abdominal cramping and postprandial loose stools. Colonoscopy in December 2017 at outside facility as outlined above with 12 adenoma removed, no random colon biopsies performed. Patient does have days with minimal stools. Doubt infectious etiology. Differential diagnosis includes IBS D, pancreatic insufficiency, drug related (metformin). Less likely microscopic colitis given abdominal cramping associated with it. Initially plan on increasing Bentyl, currently she's only taking once per day. Asked her to take at least twice daily before meals and if needed up to 3 times daily before meals. Hold for constipation. She'll call me know how she's doing in a couple weeks. If not significantly improved, would consider adding pancreatic enzymes. Of note, she has some pancreatic atrophy and calcification noted as well as small liver lesion potentially benign on recent CT imaging. I will discuss these findings with Dr. Darrick PennaFields. Further recommendations to follow.

## 2016-09-04 NOTE — Progress Notes (Signed)
cc'ed to pcp °

## 2016-09-04 NOTE — Progress Notes (Addendum)
REVIEWED. NEXT CT @ TO ACR RECOMMENDATIONS.  Primary Care Physician: Gareth MorganKnowlton, Steve, MD  Primary Gastroenterologist:  Jonette EvaSandi Fields, MD   Chief Complaint  Patient presents with  . Hemorrhoids    better with ice  . Diarrhea    HPI: [name] is a 76 y.o. female here for follow-up. Last seen in November 2016. History of IBS, GERD.  She has been staying in [place] with her son. She has her house still here. She comes back to see her doctors locally. Back in September she was in [place] and developed terrible abdominal cramping associated with diarrhea. She states she went to urgent care. CT and labs. Ultimately was referred to gastroenterologist. [facility] by Dr. Pollyann SamplesAlma Jenkins, in December 2017, one small mucosal nodule found in the cecum, biopsies taken (tubular adenoma), scattered diverticula, nonbleeding internal hemorrhoids. Recommend a repeat colonoscopy in 3 years.  Also had CT abdomen pelvis with [facility], demonstrating fatty liver, no evidence of cirrhosis. 28 x 7 mm lesion in the medial segment of the left lobe liver along the capsule likely benign, mildly atrophic pancreas, small calcification in the neck of the pancreas measured 3 mm. labs September 2017 included normal hemoglobin, normal platelets, white blood cell count, normal LFTs. [facility].  Weight is down 20 pounds since June 2017. Patient reports intentional weight loss.   Patient states her symptoms have been more persistent since last fall. Complains of postprandial abdominal cramping and urgency. Currently taking Bentyl once daily before breakfast. Takes her omeprazole at the same time. No heartburn/indigestion. Diffuse cramping. BM within an hour of meals. Last Wednesday hemorrhoids comes out. Rubbed ice on her hemorrhoids and they went back in. No problems since then. Associated with bleeding. BM 6-7 per day. Usually several days per  week. [place] 5. "Stools are never normal". No melena. No recent antibiotics. Vomiting about three times per month sometimes without meals.    Current Outpatient Prescriptions  Medication Sig Dispense Refill  . ACCU-CHEK AVIVA PLUS test strip     . albuterol (PROVENTIL) (2.5 MG/3ML) 0.083% nebulizer solution Take 3 mLs (2.5 mg total) by nebulization every 6 (six) hours as needed for wheezing or shortness of breath. 75 mL 12  . alendronate (FOSAMAX) 70 MG tablet Take 70 mg by mouth every Wednesday. Take with a full glass of water on an empty stomach.    Marland Kitchen. amLODipine (NORVASC) 10 MG tablet Take 10 mg by mouth daily.    . benzonatate (TESSALON) 100 MG capsule Take 1 capsule (100 mg total) by mouth every 8 (eight) hours. (Patient taking differently: Take 100 mg by mouth as needed. ) 21 capsule 0  . Cholecalciferol (VITAMIN D3) 5000 UNITS CAPS Take 5,000 Units by mouth daily.    . colchicine 0.6 MG tablet Take 1 tablet (0.6 mg total) by mouth every 6 (six) hours. 40 tablet 0  . Cyanocobalamin (VITAMIN B 12 PO) Take 1 tablet by mouth daily.    Marland Kitchen. dextromethorphan-guaiFENesin (MUCINEX DM) 30-600 MG 12hr tablet Take 1 tablet by mouth as needed. Reported on 09/07/2015    . dicyclomine (BENTYL) 10 MG capsule Take 1 capsule (10 mg total) by mouth 2 (two) times daily. 60 capsule 5  . doxepin (SINEQUAN) 25 MG capsule Take 75 mg by mouth at bedtime.     . furosemide (LASIX) 20 MG tablet Take 20 mg by mouth daily as needed for fluid.     .Marland Kitchen  gabapentin (NEURONTIN) 100 MG capsule Take 1 capsule (100 mg total) by mouth 2 (two) times daily as needed (pain). 60 capsule 2  . glimepiride (AMARYL) 4 MG tablet Take 4 mg by mouth daily.     Marland Kitchen guaiFENesin-dextromethorphan (ROBITUSSIN DM) 100-10 MG/5ML syrup Take 5 mLs by mouth every 4 (four) hours as needed for cough. Reported on 09/07/2015    . levothyroxine (SYNTHROID, LEVOTHROID) 25 MCG tablet Take 25 mcg by mouth daily. Reported on 09/07/2015    . levothyroxine (SYNTHROID,  LEVOTHROID) 75 MCG tablet Take 75 mcg by mouth daily.    Marland Kitchen losartan (COZAAR) 25 MG tablet Take 25 mg by mouth daily.     . meloxicam (MOBIC) 7.5 MG tablet Take 1 tablet (7.5 mg total) by mouth daily. 60 tablet 5  . metFORMIN (GLUCOPHAGE) 500 MG tablet Take 1,000 mg by mouth 2 (two) times daily with a meal.     . omeprazole (PRILOSEC) 20 MG capsule Take 20 mg by mouth 2 (two) times daily.     Marland Kitchen PROAIR HFA 108 (90 Base) MCG/ACT inhaler     . Probiotic Product (PROBIOTIC DAILY PO) Take 1 tablet by mouth daily.    . promethazine (PHENERGAN) 25 MG tablet Take 25 mg by mouth 2 (two) times daily as needed for nausea.     . traMADol (ULTRAM) 50 MG tablet Take 1 tablet (50 mg total) by mouth daily as needed for moderate pain. 30 tablet 2  . Vitamin D, Ergocalciferol, (DRISDOL) 50000 UNITS CAPS capsule Take 50,000 Units by mouth every Wednesday.     No current facility-administered medications for this visit.     Allergies as of 09/04/2016 - Review Complete 09/04/2016  Allergen Reaction Noted  . Hydrocodone Itching and Nausea And Vomiting 01/12/2015  . Naproxen Other (See Comments) 06/26/2015   Past Medical History:  Diagnosis Date  . Anxiety   . Arthritis   . Asthma   . Carotid artery aneurysm [facility])    Right s/p endovascular treatment 2004  . Carpal tunnel syndrome   . Cataract   . CKD (chronic kidney disease) stage 3, GFR 30-59 ml/min   . Depression   . Essential hypertension, benign   . GERD (gastroesophageal reflux disease)   . Glaucoma   . History of recurrent UTIs   . Hx of colonic polyps   . Hx of migraines   . Hypothyroidism   . IBS (irritable bowel syndrome)   . Low back pain   . Morbid obesity (HCC)   . Neurogenic bladder   . Osteoporosis   . Sleep apnea    Intolerant to CPAP   . Stroke (HCC)   . Type 2 diabetes mellitus with diabetic neuropathy [facility])    Past Surgical History:  Procedure Laterality Date  . ABDOMINAL HYSTERECTOMY    . Carpal tunnel release - bilateral   1992  . CEREBRAL ANEURYSM REPAIR  2004   Coil   . COLONOSCOPY N/A 03/04/2013   Dr. Darrick Penna: moderate diverticulosis, moderate internal hemorrhoids. Next colonoscopy in 5-10 years with history of simple adenomas.  . COLONOSCOPY  06/14/2006   ZOX:WRUEAV ADENOMAS(2), pTICS, [place] lipoma  . ESOPHAGOGASTRODUODENOSCOPY  08/12/2007   WUJ:WJXBJY esophagus without evidence of Barrett's,mass, erosion ulceration or stricture.  The GE junction was at 40 cm.  The Bravo  capsule was placed at 34 cm/ Unable to appreciate a hiatal hernia in the retroflexed viewNormal duodenal bulb and second portion of the duodenum. mild gastritis.   Marland Kitchen SHOULDER SURGERY  Rotator cuff repair, 2012  . SPINAL CORD STIMULATOR INSERTION    . Stomach surgery gastropexy for gastric volvulus  2009  . Total hip replacement - right  2002  . Tubular Adenoma    . VESICOVAGINAL FISTULA CLOSURE W/ TAH      ROS:  General: Negative for anorexia, unintentional weight loss, fever, chills, fatigue, weakness. ENT: Negative for hoarseness, difficulty swallowing , nasal congestion. CV: Negative for chest pain, angina, palpitations, dyspnea on exertion, peripheral edema.  Respiratory: Negative for dyspnea at rest, dyspnea on exertion, cough, sputum, wheezing.  GI: See history of present illness. GU:  Negative for dysuria, hematuria, urinary incontinence, urinary frequency, nocturnal urination.  Endo: Negative for unusual weight change.    Physical Examination:   BP (!) 158/89   Pulse 83   Temp 97.1 F (36.2 C) (Oral)   Ht 5' (1.524 m)   Wt 215 lb 3.2 oz (97.6 kg)   BMI 42.03 kg/m   General: Well-nourished, well-developed in no acute distress.  Eyes: No icterus. Mouth: Oropharyngeal mucosa moist and pink , no lesions erythema or exudate. Lungs: Clear to auscultation bilaterally.  Heart: Regular rate and rhythm, no murmurs rubs or gallops.  Abdomen: Bowel sounds are normal, nontender, nondistended, no hepatosplenomegaly or masses,  no abdominal bruits or hernia , no rebound or guarding.   Extremities: No lower extremity edema. No clubbing or deformities. Neuro: Alert and oriented x 4   Skin: Warm and dry, no jaundice.   Psych: Alert and cooperative, normal mood and affect.  Labs:  September 2017, BUN 15, creatinine 1.2, total bilirubin 0.3, alkaline phosphatase 25, AST 26, ALT 34, albumin 3.8, lipase 214, white blood cell count 8500, hemoglobin 11.5 normal, MCV 86.1 normal, platelets 250,000, TSH normal. Imaging Studies: Dg Shoulder Left  Result Date: 09/01/2016 CLINICAL DATA:  Chronic LEFT shoulder pain, fell from a car on mother's day, painful to abduct LEFT arm EXAM: LEFT SHOULDER - 2+ VIEW COMPARISON:  01/06/2015 FINDINGS: Osseous demineralization. AC joint alignment normal. Glenohumeral degenerative changes with joint space narrowing and spur formation. No acute fracture, dislocation, or bone destruction. Visualized LEFT ribs intact. IMPRESSION: No acute abnormalities. Osseous demineralization with LEFT glenohumeral degenerative changes. Electronically Signed   By: Ulyses Southward M.D.   On: 09/01/2016 12:21

## 2016-09-04 NOTE — Progress Notes (Signed)
NEW PATIENT OFFICE VISIT    Chief Complaint  Patient presents with  . Shoulder Pain    LEFT SHOULDER PAIN S/P FALL 08/13/16    76 year old female fell out of a car on Mother's Day injured her left shoulder comes in with new onset dull aching pain which is constant left shoulder associated with inability to raise her left arm  Since she fell she's had increased pain and inability to lift her arm over her head, she is having trouble getting dressed. She has some pain that radiates into her elbow  Treatment included tramadol, meloxicam and she did not improve    Review of Systems  Constitutional: Negative for chills and fever.  Musculoskeletal: Positive for neck pain.  Neurological: Positive for focal weakness. Negative for tingling.     Past Medical History:  Diagnosis Date  . Anxiety   . Arthritis   . Asthma   . Carotid artery aneurysm [facility](HCC)    Right s/p endovascular treatment 2004  . Carpal tunnel syndrome   . Cataract   . CKD (chronic kidney disease) stage 3, GFR 30-59 ml/min   . Depression   . Essential hypertension, benign   . GERD (gastroesophageal reflux disease)   . Glaucoma   . History of recurrent UTIs   . Hx of colonic polyps   . Hx of migraines   . Hypothyroidism   . IBS (irritable bowel syndrome)   . Low back pain   . Morbid obesity (HCC)   . Neurogenic bladder   . Osteoporosis   . Sleep apnea    Intolerant to CPAP   . Stroke (HCC)   . Type 2 diabetes mellitus with diabetic neuropathy [facility](HCC)     Past Surgical History:  Procedure Laterality Date  . ABDOMINAL HYSTERECTOMY    . Carpal tunnel release - bilateral  1992  . CEREBRAL ANEURYSM REPAIR  2004   Coil   . COLONOSCOPY N/A 03/04/2013   Dr. Darrick PennaFields: moderate diverticulosis, moderate internal hemorrhoids. Next colonoscopy in 5-10 years with history of simple adenomas.  . COLONOSCOPY  06/14/2006   ZOX:WRUEAVSLF:SIMPLE ADENOMAS(2), pTICS, [place] lipoma  . ESOPHAGOGASTRODUODENOSCOPY  08/12/2007   WUJ:[id number]:Normal  esophagus without evidence of Barrett's,mass, erosion ulceration or stricture.  The GE junction was at 40 cm.  The Bravo  capsule was placed at 34 cm/ Unable to appreciate a hiatal hernia in the retroflexed viewNormal duodenal bulb and second portion of the duodenum. mild gastritis.   Marland Kitchen. SHOULDER SURGERY     Rotator cuff repair, 2012  . SPINAL CORD STIMULATOR INSERTION    . Stomach surgery gastropexy for gastric volvulus  2009  . Total hip replacement - right  2002  . Tubular Adenoma    . VESICOVAGINAL FISTULA CLOSURE W/ TAH      Family History  Problem Relation Age of Onset  . Heart disease Mother   . Cancer Mother   . Diabetes Brother   . Cancer Brother        Prostate   . Cancer Brother        Pancreatic   . Cancer Sister        Breast   . Heart attack Sister   . Lung disease Unknown   . Asthma Unknown   . Colon cancer Neg Hx   . Colon polyps Neg Hx    Social History  Substance Use Topics  . Smoking status: Former Smoker    Types: Cigarettes  . Smokeless tobacco: Never Used  . Alcohol  use No    BP (!) 165/89   Pulse 78   Wt 215 lb (97.5 kg)   BMI 41.99 kg/m   Physical Exam  Constitutional: She is oriented to person, place, and time. She appears well-developed and well-nourished.  Neurological: She is alert and oriented to person, place, and time.  Psychiatric: She has a normal mood and affect.  Vitals reviewed.   Right Shoulder Exam  Right shoulder exam is normal.  Tenderness  The patient is experiencing no tenderness.    Range of Motion  The patient has normal right shoulder ROM.  Muscle Strength  The patient has normal right shoulder strength.  Tests  Apprehension: negative  Other  Erythema: absent Sensation: normal Pulse: present   Left Shoulder Exam   Tenderness  The patient is experiencing tenderness in the acromioclavicular joint and acromion (deltoid).  Range of Motion  Active Abduction:  60 abnormal  Passive Abduction:  80  abnormal  Extension: 30  Forward Flexion: 70  External Rotation: 10  Internal Rotation 0 degrees: abnormal  Internal Rotation 90 degrees: abnormal   Muscle Strength  The patient has normal left shoulder strength. Abduction: 3/5  Internal Rotation: 5/5  External Rotation: 4/5  Supraspinatus: 4/5  Subscapularis: 5/5  Biceps: 5/5   Tests  Apprehension: negative Drop Arm: positive  Other  Erythema: absent Sensation: normal Pulse: present       Encounter Diagnosis  Name Primary?  . Complete tear of left rotator cuff Yes    3 views of the left shoulder show glenohumeral arthritis without a significant amount of joint space narrowing but there is a large osteophyte on the inferior part of the humeral head there are cysts in the glenoid and humeral head. The acromial humeral head distance seems normal however on axillary x-ray we find very significant joint space narrowing   PLAN:   Further imaging will be needed to assess the left rotator cuff for tear and surgery   Meds ordered this encounter  Medications  . HYDROmorphone (DILAUDID) 2 MG tablet    Sig: Take 1 tablet (2 mg total) by mouth every 4 (four) hours as needed for severe pain.    Dispense:  30 tablet    Refill:  0

## 2016-09-04 NOTE — Patient Instructions (Addendum)
Use heat on the shoulder  Do not lift with the left arm   MRI has been ordered    Rotator Cuff Injury Rotator cuff injury is any type of injury to the set of muscles and tendons that make up the stabilizing unit of your shoulder. This unit holds the ball of your upper arm bone (humerus) in the socket of your shoulder blade (scapula). What are the causes? Injuries to your rotator cuff most commonly come from sports or activities that cause your arm to be moved repeatedly over your head. Examples of this include throwing, weight lifting, swimming, or racquet sports. Long lasting (chronic) irritation of your rotator cuff can cause soreness and swelling (inflammation), bursitis, and eventual damage to your tendons, such as a tear (rupture). What are the signs or symptoms? Acute rotator cuff tear:  Sudden tearing sensation followed by severe pain shooting from your upper shoulder down your arm toward your elbow.  Decreased range of motion of your shoulder because of pain and muscle spasm.  Severe pain.  Inability to raise your arm out to the side because of pain and loss of muscle power (large tears).  Chronic rotator cuff tear:  Pain that usually is worse at night and may interfere with sleep.  Gradual weakness and decreased shoulder motion as the pain worsens.  Decreased range of motion.  Rotator cuff tendinitis:  Deep ache in your shoulder and the outside upper arm over your shoulder.  Pain that comes on gradually and becomes worse when lifting your arm to the side or turning it inward.  How is this diagnosed? Rotator cuff injury is diagnosed through a medical history, physical exam, and imaging exam. The medical history helps determine the type of rotator cuff injury. Your health care provider will look at your injured shoulder, feel the injured area, and ask you to move your shoulder in different positions. X-ray exams typically are done to rule out other causes of shoulder pain,  such as fractures. MRI is the exam of choice for the most severe shoulder injuries because the images show muscles and tendons. How is this treated? Chronic tear:  Medicine for pain, such as acetaminophen or ibuprofen.  Physical therapy and range-of-motion exercises may be helpful in maintaining shoulder function and strength.  Steroid injections into your shoulder joint.  Surgical repair of the rotator cuff if the injury does not heal with noninvasive treatment.  Acute tear:  Anti-inflammatory medicines such as ibuprofen and naproxen to help reduce pain and swelling.  A sling to help support your arm and rest your rotator cuff muscles. Long-term use of a sling is not advised. It may cause significant stiffening of the shoulder joint.  Surgery may be considered within a few weeks, especially in younger, active people, to return the shoulder to full function.  Indications for surgical treatment include the following: ? Age younger than 60 years. ? Rotator cuff tears that are complete. ? Physical therapy, rest, and anti-inflammatory medicines have been used for 6-8 weeks, with no improvement. ? Employment or sporting activity that requires constant shoulder use.  Tendinitis:  Anti-inflammatory medicines such as ibuprofen and naproxen to help reduce pain and swelling.  A sling to help support your arm and rest your rotator cuff muscles. Long-term use of a sling is not advised. It may cause significant stiffening of the shoulder joint.  Severe tendinitis may require: ? Steroid injections into your shoulder joint. ? Physical therapy. ? Surgery.  Follow these instructions at home:  Apply ice to your injury: ? Put ice in a plastic bag. ? Place a towel between your skin and the bag. ? Leave the ice on for 20 minutes, 2-3 times a day.  If you have a shoulder immobilizer (sling and straps), wear it until told otherwise by your health care provider.  You may want to sleep on  several pillows or in a recliner at night to lessen swelling and pain.  Only take over-the-counter or prescription medicines for pain, discomfort, or fever as directed by your health care provider.  Do simple hand squeezing exercises with a soft rubber ball to decrease hand swelling. Contact a health care provider if:  Your shoulder pain increases, or new pain or numbness develops in your arm, hand, or fingers.  Your hand or fingers are colder than your other hand. Get help right away if:  Your arm, hand, or fingers are numb or tingling.  Your arm, hand, or fingers are increasingly swollen and painful, or they turn white or blue. This information is not intended to replace advice given to you by your health care provider. Make sure you discuss any questions you have with your health care provider. Document Released: 03/17/2000 Document Revised: 08/26/2015 Document Reviewed: 10/30/2012 Elsevier Interactive Patient Education  2018 [employer].

## 2016-09-05 NOTE — Telephone Encounter (Signed)
SURE

## 2016-09-12 ENCOUNTER — Ambulatory Visit (HOSPITAL_COMMUNITY)
Admission: RE | Admit: 2016-09-12 | Discharge: 2016-09-12 | Disposition: A | Discharge status: Home / Self Care | Payer: Medicare Other | Source: Ambulatory Visit | Attending: Orthopedic Surgery | Admitting: Orthopedic Surgery

## 2016-09-12 DIAGNOSIS — M75122 Complete rotator cuff tear or rupture of left shoulder, not specified as traumatic: Secondary | ICD-10-CM

## 2016-09-13 ENCOUNTER — Ambulatory Visit (HOSPITAL_COMMUNITY): Admission: RE | Admit: 2016-09-13 | Payer: Medicare Other | Source: Ambulatory Visit

## 2016-09-14 ENCOUNTER — Ambulatory Visit (HOSPITAL_COMMUNITY)
Admission: RE | Admit: 2016-09-14 | Discharge: 2016-09-14 | Disposition: A | Discharge status: Home / Self Care | Payer: Medicare Other | Source: Ambulatory Visit | Attending: Orthopedic Surgery | Admitting: Orthopedic Surgery

## 2016-09-14 DIAGNOSIS — M75122 Complete rotator cuff tear or rupture of left shoulder, not specified as traumatic: Secondary | ICD-10-CM | POA: Diagnosis present

## 2016-09-14 DIAGNOSIS — M19012 Primary osteoarthritis, left shoulder: Secondary | ICD-10-CM | POA: Diagnosis not present

## 2016-09-15 ENCOUNTER — Encounter: Payer: Self-pay | Admitting: Orthopedic Surgery

## 2016-09-15 ENCOUNTER — Ambulatory Visit (INDEPENDENT_AMBULATORY_CARE_PROVIDER_SITE_OTHER): Payer: Medicare Other | Admitting: Orthopedic Surgery

## 2016-09-15 DIAGNOSIS — M19019 Primary osteoarthritis, unspecified shoulder: Secondary | ICD-10-CM | POA: Diagnosis not present

## 2016-09-15 DIAGNOSIS — M7582 Other shoulder lesions, left shoulder: Secondary | ICD-10-CM

## 2016-09-15 DIAGNOSIS — M19012 Primary osteoarthritis, left shoulder: Secondary | ICD-10-CM | POA: Diagnosis not present

## 2016-09-15 MED ORDER — HYDROMORPHONE HCL 2 MG PO TABS: 2.0000 mg | ORAL_TABLET | ORAL | 0 refills | Status: DC | PRN

## 2016-09-15 NOTE — Progress Notes (Signed)
Patient ID: [name] H Carr, female   DOB: 03/15/1941, 76 y.o.   MRN: [id number]  Chief Complaint  Patient presents with  . Follow-up    MRI results fo left shoulder.    76 year old female with left shoulder pain presents for follow-up to discuss findings noted on MRI left shoulder. Chief complaint shoulder pain    Review of Systems  Musculoskeletal: Positive for neck pain.  Neurological: Negative for tingling.   Gen. appearance is normal grooming and hygiene normal Orientation to person place and time normal Mood normal  Prior orthopedic exam from 09/04/2016 no change Right Shoulder Exam  Right shoulder exam is normal.  Tenderness  The patient is experiencing no tenderness.    Range of Motion  The patient has normal right shoulder ROM.  Muscle Strength  The patient has normal right shoulder strength.  Tests  Apprehension: negative  Other  Erythema: absent Sensation: normal Pulse: present   Left Shoulder Exam   Tenderness  The patient is experiencing tenderness in the acromioclavicular joint and acromion (deltoid).  Range of Motion  Active Abduction:  60 abnormal  Passive Abduction:  80 abnormal  Extension: 30  Forward Flexion: 70  External Rotation: 10  Internal Rotation 0 degrees: abnormal  Internal Rotation 90 degrees: abnormal   Muscle Strength  The patient has normal left shoulder strength. Abduction: 3/5  Internal Rotation: 5/5  External Rotation: 4/5  Supraspinatus: 4/5  Subscapularis: 5/5  Biceps: 5/5   Tests  Apprehension: negative Drop Arm: positive  Other  Erythema: absent Sensation: normal Pulse: present       A/P  Medical decision-making  FINDINGS: Rotator cuff: Mild tendinosis of the supraspinatus tendon with a small insertional interstitial tear of the posterior fibers and a small partial thickness bursal surface tear of the anterior fibers. Mild tendinosis of the infraspinatus tendon with a small  insertional interstitial tear. Teres minor tendon is intact. Subscapularis tendon is intact.   Muscles: No atrophy or fatty replacement of nor abnormal signal within, the muscles of the rotator cuff.   Biceps long head: Severe tendinosis of the intra-articular portion of the long head of the biceps tendon.   Acromioclavicular Joint: Mild arthropathy of the acromioclavicular joint. Type I acromion. Small amount of subacromial/subdeltoid bursal fluid.   Glenohumeral Joint: Small joint effusion with mild synovitis. Extensive full-thickness cartilage loss of the glenohumeral joint with subchondral reactive marrow edema and subchondral cystic changes involving the glenoid and humeral head. Small inferior humeral marginal osteophyte.   After thorough review of the MRI and the report I agree that the patient has glenohumeral arthritis with tendinitis  Encounter Diagnoses  Name Primary?  Marland Kitchen. Arthritis of shoulder Yes  . Rotator cuff tendinitis, left    The patient says she is into much pain to continue with any conservative treatment and is agreeable to see shoulder replacement specialist  Patient referral to Dr. August Saucerean for left shoulder replacement  Meds ordered this encounter  Medications  . HYDROmorphone (DILAUDID) 2 MG tablet    Sig: Take 1 tablet (2 mg total) by mouth every 4 (four) hours as needed for severe pain.    Dispense:  30 tablet    Refill:  0    Fuller CanadaStanley Duesing, MD 09/15/2016 9:40 AM

## 2016-09-28 ENCOUNTER — Ambulatory Visit (INDEPENDENT_AMBULATORY_CARE_PROVIDER_SITE_OTHER): Payer: Medicare Other | Admitting: Orthopedic Surgery

## 2016-09-28 DIAGNOSIS — M25512 Pain in left shoulder: Secondary | ICD-10-CM

## 2016-09-28 DIAGNOSIS — G8929 Other chronic pain: Secondary | ICD-10-CM

## 2016-09-30 ENCOUNTER — Encounter (INDEPENDENT_AMBULATORY_CARE_PROVIDER_SITE_OTHER): Payer: Self-pay | Admitting: Orthopedic Surgery

## 2016-09-30 NOTE — Progress Notes (Signed)
Office Visit Note   Patient: [name]           Date of Birth: 05/25/1940           MRN: [id number] Visit Date: 09/28/2016 Requested by: Gareth MorganKnowlton, Steve, MD [address] [place], [place] [id number] PCP: Gareth MorganKnowlton, Steve, MD  Subjective: Chief Complaint  Patient presents with  . Left Shoulder - Pain    HPI: An as is a 76 year old female with left shoulder pain.  Chest x-ray fell out of her car on Mother's Day.  She is right-hand dominant.  She is having pain in the left shoulder prior to that injury.  Describes constant pain in the shoulder without radicular symptoms.  She does not wake him from sleep with pain.  She has had an MRI scan of the shoulder which shows severe arthritis with intact rotator cuff.  There is not too much posterior glenoid wear.  She has very good family support at home.              ROS: All systems reviewed are negative as they relate to the chief complaint within the history of present illness.  Patient denies  fevers or chills.   Assessment & Plan: Visit Diagnoses:  1. Chronic left shoulder pain     Plan: Impression is left shoulder arthritis.  Plan: After talking at length with the patient she really wants to try to get something done about this shoulder.  I think she would be a good candidate for shoulder replacement surgery.  Risks and benefits are discussed including but not limited to infection incomplete return of function dislocation nerve and vessel damage.  Nonetheless with her degree of arthritis in her age group reverse shoulder replacement as a primary option would be best for her.  Patient understands the risks and benefits.  All questions answered.  Follow-Up Instructions: No Follow-up on file.   Orders:  No orders of the defined types were placed in this encounter.  No orders of the defined types were placed in this encounter.     Procedures: No procedures performed   Clinical Data: No additional findings.  Objective: Vital  Signs: There were no vitals taken for this visit.  Physical Exam:   Constitutional: Patient appears well-developed HEENT:  Head: Normocephalic Eyes:EOM are normal Neck: Normal range of motion Cardiovascular: Normal rate Pulmonary/chest: Effort normal Neurologic: Patient is alert Skin: Skin is warm Psychiatric: Patient has normal mood and affect    Ortho Exam: Orthopedic exam demonstrates good cervical spine range of motion 5 out of 5 grip EPL FPL interosseous wrist flexion-extension biceps triceps and deltoid strength.  She does have pain with range of motion of that left shoulder.  External rotation at 15 of abduction on the left is to about 20-25.  She does not have quite 90 of abduction or forward flexion.  Deltoid does fire.  There is course grinding bone-on-bone type coarseness with passive range of motion of the shoulder.  Rotator cuff strength is intact.  Specialty Comments:  No specialty comments available.  Imaging: No results found.   PMFS History: Patient Active Problem List   Diagnosis Date Noted  . Chronic knee pain (Bilateral) 08/09/2015  . Chronic pain 08/09/2015  . Spinal cord stimulator status 08/09/2015  . Primary osteoarthritis of knee (Bilateral) 02/23/2014  . Osteoarthritis of knee (Bilateral) 08/12/2013  . PVC's (premature ventricular contractions) 07/20/2010  . ANXIETY 06/18/2007  . DIABETES MELLITUS, TYPE II 05/13/2007  . OBESITY, MORBID 05/13/2007  .  DEPRESSION 05/13/2007  . Essential hypertension, benign 05/13/2007  . ASTHMA 05/13/2007  . GERD 05/13/2007  . IBS 05/13/2007  . CKD (chronic kidney disease) stage 3, GFR 30-59 ml/min 05/13/2007  . ARTHRITIS 05/13/2007  . SLEEP APNEA 05/13/2007   Past Medical History:  Diagnosis Date  . Anxiety   . Arthritis   . Asthma   . Carotid artery aneurysm [facility])    Right s/p endovascular treatment 2004  . Carpal tunnel syndrome   . Cataract   . CKD (chronic kidney disease) stage 3, GFR 30-59 ml/min    . Depression   . Essential hypertension, benign   . GERD (gastroesophageal reflux disease)   . Glaucoma   . History of recurrent UTIs   . Hx of colonic polyps   . Hx of migraines   . Hypothyroidism   . IBS (irritable bowel syndrome)   . Low back pain   . Morbid obesity (HCC)   . Neurogenic bladder   . Osteoporosis   . Sleep apnea    Intolerant to CPAP   . Stroke (HCC)   . Type 2 diabetes mellitus with diabetic neuropathy (HCC)     Family History  Problem Relation Age of Onset  . Heart disease Mother   . Cancer Mother   . Diabetes Brother   . Cancer Brother        Prostate   . Cancer Brother        Pancreatic   . Cancer Sister        Breast   . Heart attack Sister   . Lung disease Unknown   . Asthma Unknown   . Colon cancer Neg Hx   . Colon polyps Neg Hx     Past Surgical History:  Procedure Laterality Date  . ABDOMINAL HYSTERECTOMY    . Carpal tunnel release - bilateral  1992  . CEREBRAL ANEURYSM REPAIR  2004   Coil   . COLONOSCOPY N/A 03/04/2013   Dr. Darrick Penna: moderate diverticulosis, moderate internal hemorrhoids. Next colonoscopy in 5-10 years with history of simple adenomas.  . COLONOSCOPY  06/14/2006   ZOX:WRUEAV ADENOMAS(2), pTICS, Brook[place] lipoma  . ESOPHAGOGASTRODUODENOSCOPY  08/12/2007   WUJ:WJXBJY esophagus without evidence of Barrett's,mass, erosion ulceration or stricture.  The GE junction was at 40 cm.  The Bravo  capsule was placed at 34 cm/ Unable to appreciate a hiatal hernia in the retroflexed viewNormal duodenal bulb and second portion of the duodenum. mild gastritis.   Marland Kitchen SHOULDER SURGERY     Rotator cuff repair, 2012  . SPINAL CORD STIMULATOR INSERTION    . Stomach surgery gastropexy for gastric volvulus  2009  . Total hip replacement - right  2002  . Tubular Adenoma    . VESICOVAGINAL FISTULA CLOSURE W/ TAH     Social History   Occupational History  . Retired     [employer]  .  Retired   Social History Main Topics  . Smoking status:  Former Smoker    Types: Cigarettes  . Smokeless tobacco: Never Used  . Alcohol use No  . Drug use: No  . Sexual activity: Not on file

## 2016-10-03 ENCOUNTER — Other Ambulatory Visit (INDEPENDENT_AMBULATORY_CARE_PROVIDER_SITE_OTHER): Payer: Self-pay | Admitting: Orthopedic Surgery

## 2016-10-03 ENCOUNTER — Encounter (INDEPENDENT_AMBULATORY_CARE_PROVIDER_SITE_OTHER): Payer: Self-pay | Admitting: Orthopedic Surgery

## 2016-10-03 DIAGNOSIS — M19012 Primary osteoarthritis, left shoulder: Secondary | ICD-10-CM

## 2016-10-09 ENCOUNTER — Encounter (HOSPITAL_COMMUNITY): Payer: Self-pay

## 2016-10-09 NOTE — Pre-Procedure Instructions (Signed)
LESHAE MCCLAY  10/09/2016      [id number] - [place], [place] - [address] AT [facility] [address] [place] [address] Phone: [phone] Fax: [phone]  [facility] - [place], [place] - [address]  [place] [address] Phone: [phone] Fax: [phone]  [id number] [place], [place] - [address] [place] [address] [place] [address] [facility] [address] [address] [place] [address] [place] [address] Phone: [phone] Fax: [phone]  [facility] - [place], [place] - [address] [facility] [address] [place] [address] [place] [address] Phone: [phone] Fax: [phone]    Your procedure is scheduled on Tuesday, October 17, 2016.  Report to [facility] Admitting at 0530 A.M.  Call this number if you have problems the morning of surgery:  [phone]   Remember:  Do not eat food or drink liquids after midnight.  Take these medicines the morning of surgery with A SIP OF WATER: Acetaminophen-codeine (Tylenol#4 Albuterol nebulizer solution (Preventil) - if needed Amlodipine (Norvasc) bensonatate (Tessalon) - if needed Gabapentin (Neurontin) - if needed Hydromorphone (Dilaudid) - if needed Levothyroxine (Synthroid) Omeprazole (Prilosec) Proair inhaler - if needed Promethazine (phenergan) - if needed  7 days prior to surgery STOP taking any Aspirin, Aleve, Naproxen, Ibuprofen, Motrin, Advil, Goody's, BC's, all herbal medications, fish oil, and all vitamins - this includes your meloxicam (Mobic)   How to Manage Your Diabetes Before and After Surgery  Why is it important to control my blood sugar before and after surgery? . Improving blood sugar levels before and after surgery helps healing and can limit problems. . A way of improving blood sugar control is eating a healthy diet by: o  Eating less sugar and carbohydrates o  Increasing activity/exercise o  Talking with your doctor about reaching your blood sugar  goals . High blood sugars (greater than 180 mg/dL) can raise your risk of infections and slow your recovery, so you will need to focus on controlling your diabetes during the weeks before surgery. . Make sure that the doctor who takes care of your diabetes knows about your planned surgery including the date and location.  How do I manage my blood sugar before surgery? . Check your blood sugar at least 4 times a day, starting 2 days before surgery, to make sure that the level is not too high or low. o Check your blood sugar the morning of your surgery when you wake up and every 2 hours until you get to the Short Stay unit. . If your blood sugar is less than 70 mg/dL, you will need to treat for low blood sugar: o Do not take insulin. o Treat a low blood sugar (less than 70 mg/dL) with  cup of clear juice (cranberry or apple), 4 glucose tablets, OR glucose gel. o Recheck blood sugar in 15 minutes after treatment (to make sure it is greater than 70 mg/dL). If your blood sugar is not greater than 70 mg/dL on recheck, call [phone] for further instructions. . Report your blood sugar to the short stay nurse when you get to Short Stay.  . If you are admitted to the hospital after surgery: o Your blood sugar will be checked by the staff and you will probably be given insulin after surgery (instead of oral diabetes medicines) to make sure you have good blood sugar levels. o The goal for blood sugar control after surgery is 80-180 mg/dL.   WHAT DO  I DO ABOUT MY DIABETES MEDICATION?   Marland Kitchen. Do not take oral diabetes medicines (pills) the morning of surgery.        Do not wear jewelry, make-up or nail polish.  Do not wear lotions, powders, or perfumes, or deodorant.  Do not shave 48 hours prior to surgery.  Men may shave face and neck.  Do not bring valuables to the hospital.  [facility] is not responsible for any belongings or valuables.  Contacts, dentures or bridgework may not be worn into  surgery.  Leave your suitcase in the car.  After surgery it may be brought to your room.  For patients admitted to the hospital, discharge time will be determined by your treatment team.  Patients discharged the day of surgery will not be allowed to drive home.   Name and phone number of your driver:    Special instructions:   [facility]- Preparing For Surgery  Before surgery, you can play an important role. Because skin is not sterile, your skin needs to be as free of germs as possible. You can reduce the number of germs on your skin by washing with CHG (chlorahexidine gluconate) Soap before surgery.  CHG is an antiseptic cleaner which kills germs and bonds with the skin to continue killing germs even after washing.  Please do not use if you have an allergy to CHG or antibacterial soaps. If your skin becomes reddened/irritated stop using the CHG.  Do not shave (including legs and underarms) for at least 48 hours prior to first CHG shower. It is OK to shave your face.  Please follow these instructions carefully.   1. Shower the NIGHT BEFORE SURGERY and the MORNING OF SURGERY with CHG.   2. If you chose to wash your hair, wash your hair first as usual with your normal shampoo.  3. After you shampoo, rinse your hair and body thoroughly to remove the shampoo.  4. Use CHG as you would any other liquid soap. You can apply CHG directly to the skin and wash gently with a scrungie or a clean washcloth.   5. Apply the CHG Soap to your body ONLY FROM THE NECK DOWN.  Do not use on open wounds or open sores. Avoid contact with your eyes, ears, mouth and genitals (private parts). Wash genitals (private parts) with your normal soap.  6. Wash thoroughly, paying special attention to the area where your surgery will be performed.  7. Thoroughly rinse your body with warm water from the neck down.  8. DO NOT shower/wash with your normal soap after using and rinsing off the CHG Soap.  9. Pat yourself  dry with a CLEAN TOWEL.   10. Wear CLEAN PAJAMAS   11. Place CLEAN SHEETS on your bed the night of your first shower and DO NOT SLEEP WITH PETS.    Day of Surgery: Do not apply any deodorants/lotions. Please wear clean clothes to the hospital/surgery center.      Please read over the following fact sheets that you were given. Pain Booklet, Coughing and Deep Breathing, Total Joint Packet, MRSA Information and Surgical Site Infection Prevention

## 2016-10-10 ENCOUNTER — Encounter (HOSPITAL_COMMUNITY)
Admission: RE | Admit: 2016-10-10 | Discharge: 2016-10-10 | Disposition: A | Discharge status: Home / Self Care | Payer: Medicare Other | Source: Ambulatory Visit | Attending: Orthopedic Surgery | Admitting: Orthopedic Surgery

## 2016-10-10 ENCOUNTER — Encounter (HOSPITAL_COMMUNITY): Payer: Self-pay

## 2016-10-10 ENCOUNTER — Ambulatory Visit (HOSPITAL_COMMUNITY)
Admission: RE | Admit: 2016-10-10 | Discharge: 2016-10-10 | Disposition: A | Discharge status: Home / Self Care | Payer: Medicare Other | Source: Ambulatory Visit | Attending: Orthopedic Surgery | Admitting: Orthopedic Surgery

## 2016-10-10 DIAGNOSIS — I517 Cardiomegaly: Secondary | ICD-10-CM | POA: Diagnosis not present

## 2016-10-10 DIAGNOSIS — Z01812 Encounter for preprocedural laboratory examination: Secondary | ICD-10-CM | POA: Diagnosis not present

## 2016-10-10 DIAGNOSIS — M19012 Primary osteoarthritis, left shoulder: Secondary | ICD-10-CM | POA: Diagnosis not present

## 2016-10-10 DIAGNOSIS — R9431 Abnormal electrocardiogram [ECG] [EKG]: Secondary | ICD-10-CM | POA: Diagnosis not present

## 2016-10-10 DIAGNOSIS — Z01818 Encounter for other preprocedural examination: Secondary | ICD-10-CM | POA: Diagnosis not present

## 2016-10-10 DIAGNOSIS — Z419 Encounter for procedure for purposes other than remedying health state, unspecified: Secondary | ICD-10-CM

## 2016-10-10 HISTORY — DX: Pneumonia, unspecified organism: J18.9

## 2016-10-10 LAB — BASIC METABOLIC PANEL
ANION GAP: 10 (ref 5–15)
BUN: 18 mg/dL (ref 6–20)
CO2: 22 mmol/L (ref 22–32)
Calcium: 9.1 mg/dL (ref 8.9–10.3)
Chloride: 107 mmol/L (ref 101–111)
Creatinine, Ser: 1.3 mg/dL — ABNORMAL HIGH (ref 0.44–1.00)
GFR, EST AFRICAN AMERICAN: 44 mL/min — AB (ref >60)
GFR, EST NON AFRICAN AMERICAN: 38 mL/min — AB (ref >60)
GLUCOSE: 144 mg/dL — AB (ref 65–99)
POTASSIUM: 4.2 mmol/L (ref 3.5–5.1)
Sodium: 139 mmol/L (ref 135–145)

## 2016-10-10 LAB — SURGICAL PCR SCREEN
MRSA, PCR: NEGATIVE
STAPHYLOCOCCUS AUREUS: NEGATIVE

## 2016-10-10 LAB — URINALYSIS, ROUTINE W REFLEX MICROSCOPIC
BILIRUBIN URINE: NEGATIVE
GLUCOSE, UA: NEGATIVE mg/dL
HGB URINE DIPSTICK: NEGATIVE
KETONES UR: 5 mg/dL — AB
NITRITE: NEGATIVE
PROTEIN: 30 mg/dL — AB
Specific Gravity, Urine: 1.017 (ref 1.005–1.030)
pH: 5 (ref 5.0–8.0)

## 2016-10-10 LAB — CBC
HEMATOCRIT: 38.7 % (ref 36.0–46.0)
HEMOGLOBIN: 12.6 g/dL (ref 12.0–15.0)
MCH: 27.6 pg (ref 26.0–34.0)
MCHC: 32.6 g/dL (ref 30.0–36.0)
MCV: 84.9 fL (ref 78.0–100.0)
Platelets: 228 10*3/uL (ref 150–400)
RBC: 4.56 MIL/uL (ref 3.87–5.11)
RDW: 16 % — ABNORMAL HIGH (ref 11.5–15.5)
WBC: 6.1 10*3/uL (ref 4.0–10.5)

## 2016-10-10 LAB — GLUCOSE, CAPILLARY: Glucose-Capillary: 136 mg/dL — ABNORMAL HIGH (ref 65–99)

## 2016-10-10 NOTE — Progress Notes (Addendum)
PCP - Dr. John GiovanniStephen Knowlton Cardiologist - patient denies  Chest x-ray - 10/10/2016, pending EKG - 10/10/2016, pending Stress Test - 08/01/2013 ECHO - 06/06/2013 Cardiac Cath - patient states [facility] was [facility] but does not know who did it.  Sleep Study - Patient states it was done " 10+years ago" [name] CPAP - unable to tolerate  Fasting Blood Sugar - 140 Checks Blood Sugar ___2__ times a day    Patient denies shortness of breath, fever, cough and chest pain at PAT appointment   Patient verbalized understanding of instructions that were given to them at the PAT appointment. Patient was also instructed that they will need to review over the PAT instructions again at home before surgery.

## 2016-10-11 ENCOUNTER — Telehealth (INDEPENDENT_AMBULATORY_CARE_PROVIDER_SITE_OTHER): Payer: Self-pay | Admitting: Orthopedic Surgery

## 2016-10-11 LAB — HEMOGLOBIN A1C
HEMOGLOBIN A1C: 8.2 % — AB (ref 4.8–5.6)
Mean Plasma Glucose: 189 mg/dL

## 2016-10-11 LAB — URINE CULTURE

## 2016-10-11 NOTE — Telephone Encounter (Signed)
I called [name] to inform her of her pre-op lab results.  She is scheduled for a Total Shoulder Replacement next Tuesday 10/17/16.  Her hemoglobin A1c was 8.2.  It would need to be more in the 7 range to be able to have elective joint replacement.  I discussed the results with her and advised that we would need to cancel surgery and recheck her A1c again in about 6 weeks.  Explained that preoperative HbA1c levels above 7.0% have been found to be associated with higher rates of surgical site infection. She stated that she was disappointed and did not understand why her labs were high since she is taking her medications and eating the right foods.  I called her PCP's office, [place] Sudie [name] in [place], to report lab results and asked that they call  [name] to educate her on what she needed to do to lower this number and potentially have surgery in about 6 weeks or so. She stated that she understood and would make sure she was in touch with her PCP.

## 2016-10-11 NOTE — Telephone Encounter (Signed)
thx

## 2016-10-16 NOTE — Progress Notes (Signed)
Notified Shonna ChockAllison Zelenak of HGB A1C  8.2.

## 2016-10-17 ENCOUNTER — Encounter (HOSPITAL_COMMUNITY): Admission: RE | Payer: Self-pay | Source: Ambulatory Visit

## 2016-10-17 ENCOUNTER — Inpatient Hospital Stay (HOSPITAL_COMMUNITY): Admission: RE | Admit: 2016-10-17 | Payer: Medicare Other | Source: Ambulatory Visit | Admitting: Orthopedic Surgery

## 2016-10-17 SURGERY — ARTHROPLASTY, SHOULDER, TOTAL
Anesthesia: General | Site: Shoulder | Laterality: Left

## 2016-10-23 ENCOUNTER — Inpatient Hospital Stay (INDEPENDENT_AMBULATORY_CARE_PROVIDER_SITE_OTHER): Payer: Medicare Other | Admitting: Orthopedic Surgery

## 2016-10-27 ENCOUNTER — Other Ambulatory Visit: Payer: Self-pay | Admitting: *Deleted

## 2016-10-27 DIAGNOSIS — M170 Bilateral primary osteoarthritis of knee: Secondary | ICD-10-CM

## 2016-10-27 MED ORDER — MELOXICAM 7.5 MG PO TABS: 7.5000 mg | ORAL_TABLET | Freq: Every day | ORAL | 0 refills | Status: DC

## 2016-11-05 NOTE — Progress Notes (Addendum)
Needs one year follow up CT abdomen with contrast for follow up liver lesion, pancreatic calcification neck of pancreas.  OV 12/2016 with [facility] as originally planned.

## 2016-11-06 ENCOUNTER — Other Ambulatory Visit: Payer: Self-pay

## 2016-11-06 DIAGNOSIS — K8689 Other specified diseases of pancreas: Secondary | ICD-10-CM

## 2016-11-06 DIAGNOSIS — K769 Liver disease, unspecified: Secondary | ICD-10-CM

## 2016-11-06 NOTE — Progress Notes (Signed)
Attempted to submit PA info for CT abd w/contrast via [employer] website. No PA needed. 

## 2016-11-06 NOTE — Progress Notes (Signed)
Tried to call pt at home number. A female answered phone and said she wasn't at home.

## 2016-11-06 NOTE — Progress Notes (Signed)
CT abdomen with contrast scheduled for 11/13/16 at 2:00pm, pt to arrive at 1:45pm. NPO 4 hours prior to test. Pt needs to p/u contrast before test. Tried to call pt, LMOVM for her to call office.

## 2016-11-07 NOTE — Progress Notes (Signed)
Tried to call pt on mobile and home number, no answer, left message at both numbers for her to call office.

## 2016-11-08 NOTE — Progress Notes (Signed)
Pt hasn't called office to be informed of CT appt. Called Central Scheduling and canceled CT appt for 11/13/16. Letter mailed to pt to inform of recommendation from [facility] and call office to schedule CT.

## 2016-11-13 ENCOUNTER — Ambulatory Visit (HOSPITAL_COMMUNITY): Payer: Medicare Other

## 2016-12-12 ENCOUNTER — Telehealth (INDEPENDENT_AMBULATORY_CARE_PROVIDER_SITE_OTHER): Payer: Self-pay | Admitting: Orthopedic Surgery

## 2016-12-12 NOTE — Telephone Encounter (Signed)
y

## 2016-12-12 NOTE — Telephone Encounter (Signed)
Pt called wanting to r/s her surgery that was cancelled in July due to her A1C being 8.2. Is she ok to schedule? Her A1C is now 7.2.

## 2016-12-14 ENCOUNTER — Other Ambulatory Visit (INDEPENDENT_AMBULATORY_CARE_PROVIDER_SITE_OTHER): Payer: Self-pay | Admitting: Orthopedic Surgery

## 2016-12-14 ENCOUNTER — Encounter (INDEPENDENT_AMBULATORY_CARE_PROVIDER_SITE_OTHER): Payer: Self-pay | Admitting: Orthopedic Surgery

## 2016-12-14 DIAGNOSIS — M19012 Primary osteoarthritis, left shoulder: Secondary | ICD-10-CM

## 2016-12-20 ENCOUNTER — Telehealth: Payer: Self-pay | Admitting: Orthopedic Surgery

## 2016-12-20 ENCOUNTER — Other Ambulatory Visit: Payer: Self-pay | Admitting: *Deleted

## 2016-12-20 DIAGNOSIS — M170 Bilateral primary osteoarthritis of knee: Secondary | ICD-10-CM

## 2016-12-20 MED ORDER — MELOXICAM 7.5 MG PO TABS: 7.5000 mg | ORAL_TABLET | Freq: Every day | ORAL | 0 refills | Status: DC

## 2016-12-20 NOTE — Telephone Encounter (Signed)
approve

## 2016-12-20 NOTE — Telephone Encounter (Signed)
Sent prescription in to [facility]

## 2016-12-20 NOTE — Telephone Encounter (Signed)
Routing to Dr. Folks to approve 

## 2016-12-20 NOTE — Telephone Encounter (Signed)
We received refill request from [facility] for Meloxicam 7.5   Qty 90  Take 1 tablet by mouth daily    [facility] Reference # is [id number]  Helpline  Phone# is [phone]  Fax # is [phone]

## 2017-01-03 ENCOUNTER — Other Ambulatory Visit (INDEPENDENT_AMBULATORY_CARE_PROVIDER_SITE_OTHER): Payer: Self-pay

## 2017-01-17 ENCOUNTER — Telehealth (INDEPENDENT_AMBULATORY_CARE_PROVIDER_SITE_OTHER): Payer: Self-pay | Admitting: Orthopedic Surgery

## 2017-01-17 NOTE — Telephone Encounter (Signed)
Osborne Cascoadia from [facility] called wanting to talk to Dr. August Saucerean about the patient's upcoming procedure.  She was wanting to know what medication will be prescribed, after care instructions, and getting set up for home health.  ZO#[phone] ext [phone].  Thank you.

## 2017-01-17 NOTE — Telephone Encounter (Signed)
[name] also spoke to the patient after her first telephone call and called back to let [name] know that the patient is in a lot of pain and is out of her pain medication.  Can the patient get a prescription for pain.  Thank you.

## 2017-01-17 NOTE — Telephone Encounter (Signed)
Okay for Ultram one by mouth Q8 hours when necessary pain #45 no refills

## 2017-01-17 NOTE — Telephone Encounter (Signed)
Pt request something for pain. °

## 2017-01-18 NOTE — Telephone Encounter (Signed)
When I tried to put rx in for patient, received an alert/contraindication because patient has allergy to hydrocodone, and codeine.

## 2017-01-18 NOTE — Telephone Encounter (Signed)
I tried calling patient to discuss. No answer. LMVM for her to call me back.

## 2017-01-18 NOTE — Telephone Encounter (Signed)
[name] from [facility] called back with the patient on the line as well, [name] is needing to confirm what the dosage will be for the Tramadol so she can make sure it is covered through the patient's insurance.  Patient would like a call back [phone].  Prescription needs to be sent to [facility].  [name]'s number is [phone] ext [phone] if you can not reach her, she stated you can just run it through the pharmacy.  Thank you.

## 2017-01-18 NOTE — Telephone Encounter (Signed)
This is okay to try as long as she does not have an allergy to Ultram but let her know nonetheless that she mayhave a reaction and if she wants to try at that's fine otherwise we don't really have great pain medicine for her to take at this time

## 2017-01-24 NOTE — Pre-Procedure Instructions (Signed)
[name]  01/24/2017      [facility] - [place], [place] - [address] [place] [address] Phone: [phone] Fax: [phone]  [facility] - [place] - [id number] [place] [address] Phone: [phone] Fax: [phone]    Your procedure is scheduled on Tuesday, January 30, 2017  [facility] "A" at 9:00 A.M.   Call this number if you have problems the morning of surgery:  [phone]   Remember:  Do not eat food or drink liquids after midnight.  Take these medicines the morning of surgery with A SIP OF WATER: Levothyroxine (SYNTHROID, LEVOTHROID), and Omeprazole (PRILOSEC). If needed TraMADol (ULTRAM) for pain, Promethazine (PHENERGAN) for nausea, Albuterol (PROVENTIL) nebulizer for chest tightness, PROAIR HFA 108 (90 Base) inhaler for cough or wheezing (bring with you the day of surgery), and Fluticasone-Salmeterol (ADVAIR) for cough or wheezing.  As of today, stop taking all Aspirins, Vitamins, Fish oils, and Herbal medications. Also stop all NSAIDS i.e. Advil, Ibuprofen, Motrin, Aleve, Anaprox, Naproxen, BC and Goody Powders. That also includes Meloxicam (MOBIC).  How to Manage Your Diabetes Before and After Surgery  Why is it important to control my blood sugar before and after surgery? . Improving blood sugar levels before and after surgery helps healing and can limit problems. . A way of improving blood sugar control is eating a healthy diet by: o  Eating less sugar and carbohydrates o  Increasing activity/exercise o  Talking with your doctor about reaching your blood sugar goals . High blood sugars (greater than 180 mg/dL) can raise your risk of infections and slow your recovery, so you will need to focus on controlling your diabetes during the weeks before surgery. . Make sure that the doctor who takes care of your diabetes knows  about your planned surgery including the date and location.  How do I manage my blood sugar before surgery? . Check your blood sugar at least 4 times a day, starting 2 days before surgery, to make sure that the level is not too high or low. o Check your blood sugar the morning of your surgery when you wake up and every 2 hours until you get to the Short Stay unit. . If your blood sugar is less than 70 mg/dL, you will need to treat for low blood sugar: o Do not take insulin. o Treat a low blood sugar (less than 70 mg/dL) with  cup of clear juice (cranberry or apple), 4 glucose tablets, OR glucose gel. o Recheck blood sugar in 15 minutes after treatment (to make sure it is greater than 70 mg/dL). If your blood sugar is not greater than 70 mg/dL on recheck, call [phone] for further instructions. . Report your blood sugar to the short stay nurse when you get to Short Stay.  . If you are admitted to the hospital after surgery: o Your blood sugar will be checked by the staff and you will probably be given insulin after surgery (instead of oral diabetes medicines) to make sure you have good blood sugar levels. o The goal for blood sugar control after surgery is 80-180 mg/d  WHAT DO I DO ABOUT MY DIABETES MEDICATION?  Marland Kitchen. Do not take Empagliflozin (JARDIANCE), Glimepiride (AMARYL), and MetFORMIN (GLUCOPHAGE) the morning of surgery. . If your CBG is greater than 220 mg/dL, call us at [phone]   Do not wear jewelry,  make-up or nail polish.  Do not wear lotions, powders, perfumes, or deodorant.  Do not shave 48 hours prior to surgery.   Do not bring valuables to the hospital.  [facility] is not responsible for any belongings or valuables.  Contacts, dentures or bridgework may not be worn into surgery.  Leave your suitcase in the car.  After surgery it may be brought to your room.  For patients admitted to the hospital, discharge time will be determined by your treatment team.  Patients  discharged the day of surgery will not be allowed to drive home.   Special instructions:  [place]- Preparing For Surgery  Before surgery, you can play an important role. Because skin is not sterile, your skin needs to be as free of germs as possible. You can reduce the number of germs on your skin by washing with CHG (chlorahexidine gluconate) Soap before surgery.  CHG is an antiseptic cleaner which kills germs and bonds with the skin to continue killing germs even after washing.  Please do not use if you have an allergy to CHG or antibacterial soaps. If your skin becomes reddened/irritated stop using the CHG.  Do not shave (including legs and underarms) for at least 48 hours prior to first CHG shower. It is OK to shave your face.  Please follow these instructions carefully.   1. Shower the NIGHT BEFORE SURGERY and the MORNING OF SURGERY with CHG.   2. If you chose to wash your hair, wash your hair first as usual with your normal shampoo.  3. After you shampoo, rinse your hair and body thoroughly to remove the shampoo.  4. Use CHG as you would any other liquid soap. You can apply CHG directly to the skin and wash gently with a scrungie or a clean washcloth.   5. Apply the CHG Soap to your body ONLY FROM THE NECK DOWN.  Do not use on open wounds or open sores. Avoid contact with your eyes, ears, mouth and genitals (private parts). Wash Face and genitals (private parts)  with your normal soap.  6. Wash thoroughly, paying special attention to the area where your surgery will be performed.  7. Thoroughly rinse your body with warm water from the neck down.  8. DO NOT shower/wash with your normal soap after using and rinsing off the CHG Soap.  9. Pat yourself dry with a CLEAN TOWEL.  10. Wear CLEAN PAJAMAS to bed the night before surgery, wear comfortable clothes the morning of surgery  11. Place CLEAN SHEETS on your bed the night of your first shower and DO NOT SLEEP WITH PETS.  Day  of Surgery: Do not apply any deodorants/lotions. Please wear clean clothes to the hospital/surgery center.    Please read over the following fact sheets that you were given. Pain Booklet, Coughing and Deep Breathing, MRSA Information and Surgical Site Infection Prevention

## 2017-01-25 ENCOUNTER — Encounter (HOSPITAL_COMMUNITY): Payer: Self-pay

## 2017-01-25 ENCOUNTER — Ambulatory Visit
Admission: RE | Admit: 2017-01-25 | Discharge: 2017-01-25 | Disposition: A | Discharge status: Home / Self Care | Payer: Medicare Other | Source: Ambulatory Visit | Attending: Orthopedic Surgery | Admitting: Orthopedic Surgery

## 2017-01-25 ENCOUNTER — Other Ambulatory Visit (INDEPENDENT_AMBULATORY_CARE_PROVIDER_SITE_OTHER): Payer: Self-pay

## 2017-01-25 ENCOUNTER — Encounter (HOSPITAL_COMMUNITY)
Admission: RE | Admit: 2017-01-25 | Discharge: 2017-01-25 | Disposition: A | Discharge status: Home / Self Care | Payer: Medicare Other | Source: Ambulatory Visit | Attending: Orthopedic Surgery | Admitting: Orthopedic Surgery

## 2017-01-25 DIAGNOSIS — E1140 Type 2 diabetes mellitus with diabetic neuropathy, unspecified: Secondary | ICD-10-CM | POA: Insufficient documentation

## 2017-01-25 DIAGNOSIS — Z01812 Encounter for preprocedural laboratory examination: Secondary | ICD-10-CM | POA: Diagnosis not present

## 2017-01-25 DIAGNOSIS — Z87440 Personal history of urinary (tract) infections: Secondary | ICD-10-CM | POA: Diagnosis not present

## 2017-01-25 DIAGNOSIS — G4733 Obstructive sleep apnea (adult) (pediatric): Secondary | ICD-10-CM | POA: Insufficient documentation

## 2017-01-25 DIAGNOSIS — M19012 Primary osteoarthritis, left shoulder: Secondary | ICD-10-CM | POA: Insufficient documentation

## 2017-01-25 DIAGNOSIS — K219 Gastro-esophageal reflux disease without esophagitis: Secondary | ICD-10-CM | POA: Insufficient documentation

## 2017-01-25 DIAGNOSIS — F419 Anxiety disorder, unspecified: Secondary | ICD-10-CM | POA: Insufficient documentation

## 2017-01-25 DIAGNOSIS — I129 Hypertensive chronic kidney disease with stage 1 through stage 4 chronic kidney disease, or unspecified chronic kidney disease: Secondary | ICD-10-CM | POA: Diagnosis not present

## 2017-01-25 DIAGNOSIS — N183 Chronic kidney disease, stage 3 (moderate): Secondary | ICD-10-CM | POA: Insufficient documentation

## 2017-01-25 DIAGNOSIS — M19019 Primary osteoarthritis, unspecified shoulder: Secondary | ICD-10-CM

## 2017-01-25 DIAGNOSIS — Z8673 Personal history of transient ischemic attack (TIA), and cerebral infarction without residual deficits: Secondary | ICD-10-CM | POA: Insufficient documentation

## 2017-01-25 DIAGNOSIS — M13812 Other specified arthritis, left shoulder: Secondary | ICD-10-CM

## 2017-01-25 DIAGNOSIS — E1122 Type 2 diabetes mellitus with diabetic chronic kidney disease: Secondary | ICD-10-CM | POA: Insufficient documentation

## 2017-01-25 LAB — CBC
HEMATOCRIT: 38.6 % (ref 36.0–46.0)
Hemoglobin: 12.2 g/dL (ref 12.0–15.0)
MCH: 27.3 pg (ref 26.0–34.0)
MCHC: 31.6 g/dL (ref 30.0–36.0)
MCV: 86.4 fL (ref 78.0–100.0)
PLATELETS: 244 10*3/uL (ref 150–400)
RBC: 4.47 MIL/uL (ref 3.87–5.11)
RDW: 15 % (ref 11.5–15.5)
WBC: 7.1 10*3/uL (ref 4.0–10.5)

## 2017-01-25 LAB — URINALYSIS, COMPLETE (UACMP) WITH MICROSCOPIC
Bilirubin Urine: NEGATIVE
KETONES UR: NEGATIVE mg/dL
NITRITE: POSITIVE — AB
PH: 5.5 (ref 5.0–8.0)
PROTEIN: NEGATIVE mg/dL
Specific Gravity, Urine: 1.01 (ref 1.005–1.030)

## 2017-01-25 LAB — SURGICAL PCR SCREEN
MRSA, PCR: POSITIVE — AB
STAPHYLOCOCCUS AUREUS: POSITIVE — AB

## 2017-01-25 LAB — BASIC METABOLIC PANEL
Anion gap: 12 (ref 5–15)
BUN: 11 mg/dL (ref 6–20)
CALCIUM: 8.7 mg/dL — AB (ref 8.9–10.3)
CO2: 21 mmol/L — ABNORMAL LOW (ref 22–32)
CREATININE: 1.61 mg/dL — AB (ref 0.44–1.00)
Chloride: 105 mmol/L (ref 101–111)
GFR calc Af Amer: 34 mL/min — ABNORMAL LOW (ref >60)
GFR, EST NON AFRICAN AMERICAN: 30 mL/min — AB (ref >60)
GLUCOSE: 225 mg/dL — AB (ref 65–99)
Potassium: 3.9 mmol/L (ref 3.5–5.1)
SODIUM: 138 mmol/L (ref 135–145)

## 2017-01-25 LAB — GLUCOSE, CAPILLARY: GLUCOSE-CAPILLARY: 258 mg/dL — AB (ref 65–99)

## 2017-01-25 NOTE — Progress Notes (Signed)
Mupirocin Ointment Rx called into [facility] in [place] for positive PCR of MRSA and Staph. Pt notified and states she will pick it up this afternoon.

## 2017-01-25 NOTE — Progress Notes (Addendum)
PCP - Dr. John GiovanniStephen Knowlton- [place]  Cardiologist - Denies  Chest x-ray - 10/10/16 (E)  EKG - 10/10/16 (E)  Stress Test - 08/01/13 (E)  ECHO - 06/06/13 (E)  Cardiac Cath - 1980 or 1990-No stents  Sleep Study - Yes CPAP - No- Uses a sleep aid  Fasting Blood Sugar - 200-300 over the last 2 months, Today 258 Checks Blood Sugar ___2__ times a day, Pt sts she has not been checking it as often due to the readings being high. Pt sts her doctor told her to stop taking the Amaryl and Metformin will taking the Jardiance. Requested HA1C from her doctor, pt sts it was drawn 2 weeks ago. Left a note to draw DOS if not received. Last HA1C from 7/18 was 8. 2.  Chart will be given to anesthesia for review due to history.  Pt denies having chest pain, sob, or fever at this time. All instructions explained to the pt, with a verbal understanding of the material. Pt agrees to go over the instructions while at home for a better understanding. The opportunity to ask questions was provided.

## 2017-01-25 NOTE — Progress Notes (Signed)
Message with for Kim for Dr. August Saucerean of [facility] regarding an abnormal UA.

## 2017-01-26 ENCOUNTER — Other Ambulatory Visit (INDEPENDENT_AMBULATORY_CARE_PROVIDER_SITE_OTHER): Payer: Self-pay | Admitting: Orthopedic Surgery

## 2017-01-26 ENCOUNTER — Other Ambulatory Visit (INDEPENDENT_AMBULATORY_CARE_PROVIDER_SITE_OTHER): Payer: Self-pay

## 2017-01-26 DIAGNOSIS — M19012 Primary osteoarthritis, left shoulder: Secondary | ICD-10-CM

## 2017-01-26 MED ORDER — SULFAMETHOXAZOLE-TRIMETHOPRIM 800-160 MG PO TABS: 1.0000 | ORAL_TABLET | Freq: Two times a day (BID) | ORAL | 0 refills | Status: DC

## 2017-01-26 NOTE — Progress Notes (Signed)
Please call patient with results. Thanks pls change her pre op abx to vanc 1 g thx

## 2017-01-28 LAB — URINE CULTURE

## 2017-01-28 NOTE — Progress Notes (Addendum)
Anesthesia Chart Review: Patient is a 76 year old female scheduled for left total shoulder arthroplasty on 01/30/17 by Dr. Cammy CopaGregory Scott Dean. She was initially going to have surgery in July, but was delayed to get DM better controlled (A1c 8.2).  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 BMI is consistent with morbid obesity.   PCP is Dr. John GiovanniStephen Knowlton in [place]. She is not routinely followed by a cardiologist, but [facility] and saw Dr. Nona DellSamuel McDowell in 2015 for SOB and preoperative evaluation prior to right rotator cuff surgery. She denied CP and SOB at PAT. [facility].   Meds include albuterol, vitamin D3 and B12, Bentyl, doxepin, Jardiance, Advair, Lasix, Amaryl, Mucinex, levothyroxine, Salonpas EX, losartan, Prilosec, Proair HFA, tramadol.   BP (!) 117/96   Pulse 80   Temp 36.7 C (Oral)   Resp 18   Ht 5' (1.524 m)   Wt 210 lb 2 oz (95.3 kg)   SpO2 100%   BMI 41.04 kg/m  CBG 250.  EKG 10/10/16: NSR, non-specific ST/T wave abnormality.  Nuclear stress test 08/01/13: IMPRESSION: 1.  Abnormal Lexiscan Cardiolite stress test. 2. Small fixed apical anteroseptal defect noted, which was mildly hypokinetic. While this may represent a small region of myocardial scar, soft tissue attenuation artifact cannot entirely be excluded. 3.  Normal left ventricular systolic function, calculated LV EF 56%. 4.  No chest pain was reported. (Dr. Diona BrownerMcDowell reviewed and wrote, "Reviewed report. There is a small abnormality in the apical anteroseptal wall, reflects potential scar from an old  event, but importantly she has no major ischemic territories and her LVEF is normal at 56%. This result would not represent a clear indication that she has major underlying obstructive CAD as a cause of her shortness of breath. Overall reassuring. I would say however that if her symptoms persist without other explanation, we can always consider further evaluation, potentially even with cardiac catheterization.")  Echo 06/06/13: Study Conclusions - Left ventricle: The cavity size was normal. Wall thickness was increased in a pattern of mild LVH. Doppler parameters are consistent with abnormal left ventricular relaxation (grade 1 diastolic dysfunction). - Mitral valve: Calcified annulus. Mildly thickened leaflets. - Right ventricle: There was mild hypertrophy.  Cardiac cath 03/19/02: Impression: Angiographically normal coronary arteries. EF 65% without regional wall motion abnormality.  CXR 10/10/16: IMPRESSION: No active cardiopulmonary disease. No evidence of pneumonia or pulmonary edema. Stable mild cardiomegaly.  PFTs 06/02/13: FVC 1.92 (107%), FEV1 1.73 (126%), DLCO 10.52 (55%).  Preoperative labs noted. CBC WNL. Cr 1.61, up from 1.30-1.53 when compared to labs dating back to 11/04/14. Random glucose 225. A1c requested from PCP (was 8.2 on 10/10/16) She reported recently being told to hold Amaryl and metformin and start Jardiance. She has not been checking home glucose as often, but says some fasting CBGs have been 200-300. Urine culture grew > 100,000 colonies Klebsiella Pneumoniae and Serratia Marcescens. (PAT RN left message with surgeon's office regarding UA result 01/25/17.)  Based on patient's reported home CBGs I'm not sure her DM is better controlled since July. Will follow-up A1c results from her PCP.  Velna Ochsllison Azlaan Isidore, PA-C [facility] Short Stay Center/Anesthesiology Phone [phone] 01/28/2017 4:23 PM  Addendum:  12/01/16 office note from [facility] Received. Patient was  seen by Dillard Essex, NP. Notes indicated Jardiance was added on 10/25/16. I don't seen any notation that states that metformin and glimepiride were to be discontinued. Patient reported A1c checked 2 weeks ago, but A1c received was from 12/01/16 and was 7.2. Cr then was 1.55. I did not get an answer when I called patient this morning, but verified with PAT RN that patient reported "Fasting Blood Sugar - 200-300 over the last 2 months, Today 258 [random CBG] Checks Blood Sugar ___2__ times a day, Pt sts she has not been checking it as often due to the readings being high. Pt sts her doctor told her to stop taking the Amaryl and Metformin will taking the Jardiance. Requested HA1C from her doctor, pt sts it was drawn 2 weeks ago. Left a note to draw DOS if not received. Last HA1C from 7/18 was 8. 2."  I have notified Cheryl in Dr. Diamantina Providence office that patient reported fasting CBG 200-300 over the last few months had to her understanding was to stop other DM medications once on Jardiance (although to me, it looks like it was actually suppose to be added to her DM regimen). I have not been able to reach patient this morning to confirm this, so suggested Dr. August Saucer or his staff may want to attempt to reach patient as well to clarify current DM control. If patient arrives day of surgery with a significant elevated fasting CBG then surgery could be delayed or cancelled; however, if CBG result was acceptable and remains asymptomatic from a CV standpoint then I would anticipate that she could proceed from an anesthesia standpoint. Elnita Maxwell will review with Dr. August Saucer. Also Elnita Maxwell confirmed they were aware of urine culture results. (Update 01/29/17 4:39 PM: Elnita Maxwell has reviewed with Dr. August Saucer and has communicated with patient's PCP office staff. They confirmed that Jardiance was to be added to her DM regimen and not in place of. They also attempted to reach patient to see if she could get a STAT A1c at [facility] today, but  were unable to reach patient. Dr. August Saucer would like a STAT A1c done on arrival tomorrow. Plans to proceed may depend on A1c and glucose results.)  Velna Ochs [facility] Short Stay Center/Anesthesiology Phone [phone] 01/29/2017 11:19 AM

## 2017-01-29 ENCOUNTER — Other Ambulatory Visit (INDEPENDENT_AMBULATORY_CARE_PROVIDER_SITE_OTHER): Payer: Self-pay | Admitting: Orthopedic Surgery

## 2017-01-29 ENCOUNTER — Telehealth (INDEPENDENT_AMBULATORY_CARE_PROVIDER_SITE_OTHER): Payer: Self-pay | Admitting: Orthopedic Surgery

## 2017-01-29 NOTE — Telephone Encounter (Signed)
[name] called in to discuss a voicemail I left for her earlier.  Wanted to discuss her blood sugar and A1-C results.  Her PCP states that her last visit was 12/01/16.  [name] says that is a mistake and her last visit was just a few weeks ago. I asked if she had been checking her blood sugar regularly.  She advised me that she stopped checking it because it was high and that upset her.  She told me that I needed to "quit playing with her".  States that we've "messed around with her" and cancelled her surgery once because of her A1-C.  I tried to explain the reasons why her blood sugar needed to be under control for elective surgery and she stated that she knew the risks and that Dr. August Saucerean was going to do her surgery no matter what.  Her "children and grandchildren have taken time off work for this procedure and I was not going to cancel it".  She will have STAT labs tomorrow am before her procedure.  I advised Dr. August Saucerean of our conversation.

## 2017-01-30 ENCOUNTER — Inpatient Hospital Stay (HOSPITAL_COMMUNITY): Payer: Medicare Other | Admitting: Vascular Surgery

## 2017-01-30 ENCOUNTER — Encounter (HOSPITAL_COMMUNITY)
Admission: RE | Disposition: A | Discharge status: Home Health | Payer: Self-pay | Source: Ambulatory Visit | Attending: Orthopedic Surgery

## 2017-01-30 ENCOUNTER — Inpatient Hospital Stay (HOSPITAL_COMMUNITY): Payer: Medicare Other

## 2017-01-30 ENCOUNTER — Other Ambulatory Visit: Payer: Self-pay | Admitting: Internal Medicine

## 2017-01-30 ENCOUNTER — Inpatient Hospital Stay (HOSPITAL_COMMUNITY)
Admission: RE | Admit: 2017-01-30 | Discharge: 2017-02-01 | DRG: 483 | Disposition: A | Discharge status: Home Health | Payer: Medicare Other | Source: Ambulatory Visit | Attending: Orthopedic Surgery | Admitting: Orthopedic Surgery

## 2017-01-30 ENCOUNTER — Encounter (HOSPITAL_COMMUNITY): Payer: Self-pay

## 2017-01-30 DIAGNOSIS — Z8673 Personal history of transient ischemic attack (TIA), and cerebral infarction without residual deficits: Secondary | ICD-10-CM

## 2017-01-30 DIAGNOSIS — G4730 Sleep apnea, unspecified: Secondary | ICD-10-CM | POA: Diagnosis present

## 2017-01-30 DIAGNOSIS — N179 Acute kidney failure, unspecified: Secondary | ICD-10-CM | POA: Diagnosis not present

## 2017-01-30 DIAGNOSIS — N183 Chronic kidney disease, stage 3 (moderate): Secondary | ICD-10-CM | POA: Diagnosis not present

## 2017-01-30 DIAGNOSIS — Z87891 Personal history of nicotine dependence: Secondary | ICD-10-CM | POA: Diagnosis not present

## 2017-01-30 DIAGNOSIS — Z9841 Cataract extraction status, right eye: Secondary | ICD-10-CM

## 2017-01-30 DIAGNOSIS — Z961 Presence of intraocular lens: Secondary | ICD-10-CM | POA: Diagnosis present

## 2017-01-30 DIAGNOSIS — E669 Obesity, unspecified: Secondary | ICD-10-CM | POA: Diagnosis not present

## 2017-01-30 DIAGNOSIS — Z6841 Body Mass Index (BMI) 40.0 and over, adult: Secondary | ICD-10-CM | POA: Diagnosis not present

## 2017-01-30 DIAGNOSIS — Z9842 Cataract extraction status, left eye: Secondary | ICD-10-CM

## 2017-01-30 DIAGNOSIS — E119 Type 2 diabetes mellitus without complications: Secondary | ICD-10-CM | POA: Diagnosis not present

## 2017-01-30 DIAGNOSIS — M25512 Pain in left shoulder: Secondary | ICD-10-CM | POA: Diagnosis present

## 2017-01-30 DIAGNOSIS — Z7984 Long term (current) use of oral hypoglycemic drugs: Secondary | ICD-10-CM

## 2017-01-30 DIAGNOSIS — K219 Gastro-esophageal reflux disease without esophagitis: Secondary | ICD-10-CM | POA: Diagnosis not present

## 2017-01-30 DIAGNOSIS — I129 Hypertensive chronic kidney disease with stage 1 through stage 4 chronic kidney disease, or unspecified chronic kidney disease: Secondary | ICD-10-CM | POA: Diagnosis not present

## 2017-01-30 DIAGNOSIS — H409 Unspecified glaucoma: Secondary | ICD-10-CM | POA: Diagnosis present

## 2017-01-30 DIAGNOSIS — M19012 Primary osteoarthritis, left shoulder: Secondary | ICD-10-CM | POA: Diagnosis not present

## 2017-01-30 DIAGNOSIS — F419 Anxiety disorder, unspecified: Secondary | ICD-10-CM | POA: Diagnosis not present

## 2017-01-30 DIAGNOSIS — E039 Hypothyroidism, unspecified: Secondary | ICD-10-CM | POA: Diagnosis present

## 2017-01-30 DIAGNOSIS — F329 Major depressive disorder, single episode, unspecified: Secondary | ICD-10-CM | POA: Diagnosis not present

## 2017-01-30 DIAGNOSIS — J45909 Unspecified asthma, uncomplicated: Secondary | ICD-10-CM | POA: Diagnosis present

## 2017-01-30 DIAGNOSIS — E1122 Type 2 diabetes mellitus with diabetic chronic kidney disease: Secondary | ICD-10-CM | POA: Diagnosis not present

## 2017-01-30 DIAGNOSIS — Z803 Family history of malignant neoplasm of breast: Secondary | ICD-10-CM | POA: Diagnosis not present

## 2017-01-30 DIAGNOSIS — Z419 Encounter for procedure for purposes other than remedying health state, unspecified: Secondary | ICD-10-CM

## 2017-01-30 DIAGNOSIS — M19019 Primary osteoarthritis, unspecified shoulder: Secondary | ICD-10-CM | POA: Diagnosis present

## 2017-01-30 DIAGNOSIS — M810 Age-related osteoporosis without current pathological fracture: Secondary | ICD-10-CM | POA: Diagnosis present

## 2017-01-30 DIAGNOSIS — N319 Neuromuscular dysfunction of bladder, unspecified: Secondary | ICD-10-CM | POA: Diagnosis present

## 2017-01-30 DIAGNOSIS — Z833 Family history of diabetes mellitus: Secondary | ICD-10-CM | POA: Diagnosis not present

## 2017-01-30 DIAGNOSIS — Z96641 Presence of right artificial hip joint: Secondary | ICD-10-CM | POA: Diagnosis present

## 2017-01-30 DIAGNOSIS — Z96612 Presence of left artificial shoulder joint: Secondary | ICD-10-CM | POA: Diagnosis not present

## 2017-01-30 DIAGNOSIS — E1140 Type 2 diabetes mellitus with diabetic neuropathy, unspecified: Secondary | ICD-10-CM | POA: Diagnosis not present

## 2017-01-30 DIAGNOSIS — Z79899 Other long term (current) drug therapy: Secondary | ICD-10-CM | POA: Diagnosis not present

## 2017-01-30 DIAGNOSIS — Z800 Family history of malignant neoplasm of digestive organs: Secondary | ICD-10-CM | POA: Diagnosis not present

## 2017-01-30 DIAGNOSIS — Z8249 Family history of ischemic heart disease and other diseases of the circulatory system: Secondary | ICD-10-CM | POA: Diagnosis not present

## 2017-01-30 DIAGNOSIS — Z8739 Personal history of other diseases of the musculoskeletal system and connective tissue: Secondary | ICD-10-CM | POA: Diagnosis not present

## 2017-01-30 DIAGNOSIS — N189 Chronic kidney disease, unspecified: Secondary | ICD-10-CM | POA: Diagnosis not present

## 2017-01-30 HISTORY — PX: TOTAL SHOULDER ARTHROPLASTY: SHX126

## 2017-01-30 LAB — GLUCOSE, CAPILLARY
GLUCOSE-CAPILLARY: 267 mg/dL — AB (ref 65–99)
Glucose-Capillary: 173 mg/dL — ABNORMAL HIGH (ref 65–99)
Glucose-Capillary: 130 mg/dL — ABNORMAL HIGH (ref 65–99)

## 2017-01-30 LAB — HEMOGLOBIN A1C
Hgb A1c MFr Bld: 8.1 % — ABNORMAL HIGH (ref 4.8–5.6)
MEAN PLASMA GLUCOSE: 185.77 mg/dL

## 2017-01-30 SURGERY — ARTHROPLASTY, SHOULDER, TOTAL
Anesthesia: General | Site: Shoulder | Laterality: Left

## 2017-01-30 MED ORDER — PROMETHAZINE HCL 25 MG PO TABS: 25.0000 mg | ORAL_TABLET | Freq: Two times a day (BID) | ORAL | Status: DC | PRN

## 2017-01-30 MED ORDER — CHLORHEXIDINE GLUCONATE 4 % EX LIQD: 60.0000 mL | Freq: Once | CUTANEOUS | Status: DC

## 2017-01-30 MED ORDER — HEMOSTATIC AGENTS (NO CHARGE) OPTIME
TOPICAL | Status: DC | PRN
  Administered 2017-01-30: 1

## 2017-01-30 MED ORDER — METOCLOPRAMIDE HCL 5 MG/ML IJ SOLN: 5.0000 mg | Freq: Three times a day (TID) | INTRAMUSCULAR | Status: DC | PRN

## 2017-01-30 MED ORDER — PHENOL 1.4 % MT LIQD: 1.0000 | OROMUCOSAL | Status: DC | PRN

## 2017-01-30 MED ORDER — ROPIVACAINE HCL 7.5 MG/ML IJ SOLN
INTRAMUSCULAR | Status: DC | PRN
  Administered 2017-01-30: 20 mL via PERINEURAL

## 2017-01-30 MED ORDER — LACTATED RINGERS IV SOLN
INTRAVENOUS | Status: DC
  Administered 2017-01-30: 10:00:00 via INTRAVENOUS

## 2017-01-30 MED ORDER — MELOXICAM 7.5 MG PO TABS
7.5000 mg | ORAL_TABLET | Freq: Every day | ORAL | Status: DC
  Administered 2017-01-30 – 2017-02-01 (×3): 7.5 mg via ORAL
  Filled 2017-01-30 (×3): qty 1

## 2017-01-30 MED ORDER — BUPIVACAINE HCL (PF) 0.5 % IJ SOLN
INTRAMUSCULAR | Status: AC
  Filled 2017-01-30: qty 30

## 2017-01-30 MED ORDER — ONDANSETRON HCL 4 MG/2ML IJ SOLN: 4.0000 mg | Freq: Four times a day (QID) | INTRAMUSCULAR | Status: DC | PRN

## 2017-01-30 MED ORDER — PROPOFOL 10 MG/ML IV BOLUS
INTRAVENOUS | Status: AC
  Filled 2017-01-30: qty 20

## 2017-01-30 MED ORDER — PHENAZOPYRIDINE HCL 200 MG PO TABS
200.0000 mg | ORAL_TABLET | Freq: Three times a day (TID) | ORAL | Status: DC
  Administered 2017-01-31 – 2017-02-01 (×4): 200 mg via ORAL
  Filled 2017-01-30 (×6): qty 1

## 2017-01-30 MED ORDER — MORPHINE SULFATE (PF) 4 MG/ML IV SOLN
INTRAVENOUS | Status: AC
  Filled 2017-01-30: qty 2

## 2017-01-30 MED ORDER — INSULIN ASPART 100 UNIT/ML ~~LOC~~ SOLN: 0.0000 [IU] | Freq: Three times a day (TID) | SUBCUTANEOUS | Status: DC

## 2017-01-30 MED ORDER — ONDANSETRON HCL 4 MG PO TABS
4.0000 mg | ORAL_TABLET | Freq: Four times a day (QID) | ORAL | Status: DC | PRN
  Administered 2017-02-01: 4 mg via ORAL
  Filled 2017-01-30: qty 1

## 2017-01-30 MED ORDER — ACETAMINOPHEN 325 MG PO TABS: 650.0000 mg | ORAL_TABLET | ORAL | Status: DC | PRN

## 2017-01-30 MED ORDER — CLONIDINE HCL (ANALGESIA) 100 MCG/ML EP SOLN
EPIDURAL | Status: DC | PRN
  Administered 2017-01-30: 1 mL

## 2017-01-30 MED ORDER — ROCURONIUM BROMIDE 50 MG/5ML IV SOSY
PREFILLED_SYRINGE | INTRAVENOUS | Status: DC | PRN
  Administered 2017-01-30: 40 mg via INTRAVENOUS

## 2017-01-30 MED ORDER — SUCCINYLCHOLINE CHLORIDE 200 MG/10ML IV SOSY
PREFILLED_SYRINGE | INTRAVENOUS | Status: DC | PRN
  Administered 2017-01-30: 100 mg via INTRAVENOUS

## 2017-01-30 MED ORDER — GUAIFENESIN ER 600 MG PO TB12: 600.0000 mg | ORAL_TABLET | Freq: Two times a day (BID) | ORAL | Status: DC | PRN

## 2017-01-30 MED ORDER — POTASSIUM CHLORIDE IN NACL 20-0.9 MEQ/L-% IV SOLN
INTRAVENOUS | Status: DC
  Administered 2017-01-30: 22:00:00 via INTRAVENOUS
  Filled 2017-01-30: qty 1000

## 2017-01-30 MED ORDER — LACTATED RINGERS IV SOLN: INTRAVENOUS | Status: DC

## 2017-01-30 MED ORDER — FENTANYL CITRATE (PF) 100 MCG/2ML IJ SOLN: 25.0000 ug | INTRAMUSCULAR | Status: DC | PRN

## 2017-01-30 MED ORDER — VANCOMYCIN HCL IN DEXTROSE 1-5 GM/200ML-% IV SOLN
1000.0000 mg | Freq: Two times a day (BID) | INTRAVENOUS | Status: AC
  Administered 2017-01-31: 1000 mg via INTRAVENOUS
  Filled 2017-01-30: qty 200

## 2017-01-30 MED ORDER — DEXAMETHASONE SODIUM PHOSPHATE 10 MG/ML IJ SOLN
INTRAMUSCULAR | Status: AC
  Filled 2017-01-30: qty 1

## 2017-01-30 MED ORDER — PHENYLEPHRINE HCL 10 MG/ML IJ SOLN
INTRAVENOUS | Status: DC | PRN
  Administered 2017-01-30: 15 ug/min via INTRAVENOUS

## 2017-01-30 MED ORDER — LIDOCAINE 2% (20 MG/ML) 5 ML SYRINGE
INTRAMUSCULAR | Status: AC
  Filled 2017-01-30: qty 5

## 2017-01-30 MED ORDER — DOXEPIN HCL 75 MG PO CAPS
75.0000 mg | ORAL_CAPSULE | Freq: Every day | ORAL | Status: DC
  Administered 2017-01-30 – 2017-01-31 (×2): 75 mg via ORAL
  Filled 2017-01-30 (×3): qty 1

## 2017-01-30 MED ORDER — ONDANSETRON HCL 4 MG/2ML IJ SOLN
INTRAMUSCULAR | Status: AC
  Filled 2017-01-30: qty 2

## 2017-01-30 MED ORDER — SUGAMMADEX SODIUM 200 MG/2ML IV SOLN
INTRAVENOUS | Status: DC | PRN
  Administered 2017-01-30: 200 mg via INTRAVENOUS

## 2017-01-30 MED ORDER — LEVOTHYROXINE SODIUM 75 MCG PO TABS
75.0000 ug | ORAL_TABLET | Freq: Every day | ORAL | Status: DC
  Administered 2017-01-31 – 2017-02-01 (×2): 75 ug via ORAL
  Filled 2017-01-30 (×2): qty 1

## 2017-01-30 MED ORDER — PHENYLEPHRINE HCL 10 MG/ML IJ SOLN
INTRAMUSCULAR | Status: AC
  Filled 2017-01-30: qty 1

## 2017-01-30 MED ORDER — FENTANYL CITRATE (PF) 100 MCG/2ML IJ SOLN
50.0000 ug | Freq: Once | INTRAMUSCULAR | Status: AC
  Administered 2017-01-30: 50 ug via INTRAVENOUS

## 2017-01-30 MED ORDER — ONDANSETRON HCL 4 MG/2ML IJ SOLN: 4.0000 mg | Freq: Once | INTRAMUSCULAR | Status: DC | PRN

## 2017-01-30 MED ORDER — LACTATED RINGERS IV SOLN
INTRAVENOUS | Status: DC | PRN
  Administered 2017-01-30 (×2): via INTRAVENOUS

## 2017-01-30 MED ORDER — VITAMIN B-12 1000 MCG PO TABS
ORAL_TABLET | Freq: Every day | ORAL | Status: DC
  Administered 2017-01-31: 10:00:00 via ORAL
  Administered 2017-02-01: 1000 ug via ORAL
  Filled 2017-01-30 (×2): qty 1

## 2017-01-30 MED ORDER — ONDANSETRON HCL 4 MG/2ML IJ SOLN
INTRAMUSCULAR | Status: DC | PRN
  Administered 2017-01-30: 4 mg via INTRAVENOUS

## 2017-01-30 MED ORDER — MOMETASONE FURO-FORMOTEROL FUM 200-5 MCG/ACT IN AERO
2.0000 | INHALATION_SPRAY | Freq: Two times a day (BID) | RESPIRATORY_TRACT | Status: DC
  Administered 2017-01-30: 2 via RESPIRATORY_TRACT
  Filled 2017-01-30: qty 8.8

## 2017-01-30 MED ORDER — DICYCLOMINE HCL 10 MG PO CAPS: 10.0000 mg | ORAL_CAPSULE | Freq: Three times a day (TID) | ORAL | Status: DC | PRN

## 2017-01-30 MED ORDER — PANTOPRAZOLE SODIUM 40 MG PO TBEC
40.0000 mg | DELAYED_RELEASE_TABLET | Freq: Every day | ORAL | Status: DC
  Administered 2017-01-31 – 2017-02-01 (×2): 40 mg via ORAL
  Filled 2017-01-30 (×2): qty 1

## 2017-01-30 MED ORDER — DEXAMETHASONE SODIUM PHOSPHATE 10 MG/ML IJ SOLN
INTRAMUSCULAR | Status: DC | PRN
  Administered 2017-01-30: 4 mg via INTRAVENOUS

## 2017-01-30 MED ORDER — ALBUTEROL SULFATE (2.5 MG/3ML) 0.083% IN NEBU: 2.5000 mg | INHALATION_SOLUTION | Freq: Four times a day (QID) | RESPIRATORY_TRACT | Status: DC | PRN

## 2017-01-30 MED ORDER — MIDAZOLAM HCL 2 MG/2ML IJ SOLN
INTRAMUSCULAR | Status: AC
  Filled 2017-01-30: qty 2

## 2017-01-30 MED ORDER — TRAMADOL HCL 50 MG PO TABS: 50.0000 mg | ORAL_TABLET | Freq: Two times a day (BID) | ORAL | Status: DC | PRN

## 2017-01-30 MED ORDER — FENTANYL CITRATE (PF) 250 MCG/5ML IJ SOLN
INTRAMUSCULAR | Status: AC
  Filled 2017-01-30: qty 5

## 2017-01-30 MED ORDER — FUROSEMIDE 20 MG PO TABS
20.0000 mg | ORAL_TABLET | Freq: Every day | ORAL | Status: DC
  Administered 2017-01-31 – 2017-02-01 (×2): 20 mg via ORAL
  Filled 2017-01-30 (×2): qty 1

## 2017-01-30 MED ORDER — INSULIN ASPART 100 UNIT/ML ~~LOC~~ SOLN
0.0000 [IU] | Freq: Every day | SUBCUTANEOUS | Status: DC
  Administered 2017-01-30: 3 [IU] via SUBCUTANEOUS

## 2017-01-30 MED ORDER — FENTANYL CITRATE (PF) 100 MCG/2ML IJ SOLN
INTRAMUSCULAR | Status: AC
  Administered 2017-01-30: 50 ug via INTRAVENOUS
  Filled 2017-01-30: qty 2

## 2017-01-30 MED ORDER — VITAMIN D 1000 UNITS PO TABS
5000.0000 [IU] | ORAL_TABLET | Freq: Every day | ORAL | Status: DC
  Administered 2017-01-31 – 2017-02-01 (×2): 5000 [IU] via ORAL
  Filled 2017-01-30 (×2): qty 5

## 2017-01-30 MED ORDER — ASPIRIN EC 325 MG PO TBEC
325.0000 mg | DELAYED_RELEASE_TABLET | Freq: Every day | ORAL | Status: DC
  Administered 2017-01-30 – 2017-02-01 (×3): 325 mg via ORAL
  Filled 2017-01-30 (×3): qty 1

## 2017-01-30 MED ORDER — ACETAMINOPHEN 650 MG RE SUPP: 650.0000 mg | RECTAL | Status: DC | PRN

## 2017-01-30 MED ORDER — COLCHICINE 0.6 MG PO TABS: 0.6000 mg | ORAL_TABLET | Freq: Every day | ORAL | Status: DC | PRN

## 2017-01-30 MED ORDER — BUPIVACAINE HCL (PF) 0.5 % IJ SOLN
INTRAMUSCULAR | Status: DC | PRN
  Administered 2017-01-30: 10 mL

## 2017-01-30 MED ORDER — MORPHINE SULFATE (PF) 4 MG/ML IV SOLN
4.0000 mg | INTRAVENOUS | Status: DC | PRN
  Administered 2017-01-31: 4 mg via INTRAVENOUS
  Filled 2017-01-30: qty 1

## 2017-01-30 MED ORDER — HYDROMORPHONE HCL 2 MG PO TABS
2.0000 mg | ORAL_TABLET | ORAL | Status: DC | PRN
  Administered 2017-02-01: 2 mg via ORAL
  Filled 2017-01-30 (×2): qty 1

## 2017-01-30 MED ORDER — LIDOCAINE 2% (20 MG/ML) 5 ML SYRINGE
INTRAMUSCULAR | Status: DC | PRN
  Administered 2017-01-30: 80 mg via INTRAVENOUS

## 2017-01-30 MED ORDER — MORPHINE SULFATE (PF) 4 MG/ML IV SOLN
INTRAVENOUS | Status: DC | PRN
  Administered 2017-01-30: 8 mg

## 2017-01-30 MED ORDER — SUGAMMADEX SODIUM 200 MG/2ML IV SOLN
INTRAVENOUS | Status: AC
  Filled 2017-01-30: qty 2

## 2017-01-30 MED ORDER — FENTANYL CITRATE (PF) 100 MCG/2ML IJ SOLN
INTRAMUSCULAR | Status: DC | PRN
  Administered 2017-01-30 (×4): 50 ug via INTRAVENOUS

## 2017-01-30 MED ORDER — SUCCINYLCHOLINE CHLORIDE 200 MG/10ML IV SOSY
PREFILLED_SYRINGE | INTRAVENOUS | Status: AC
  Filled 2017-01-30: qty 10

## 2017-01-30 MED ORDER — ALBUTEROL SULFATE HFA 108 (90 BASE) MCG/ACT IN AERS: 2.0000 | INHALATION_SPRAY | Freq: Four times a day (QID) | RESPIRATORY_TRACT | Status: DC | PRN

## 2017-01-30 MED ORDER — BENZONATATE 100 MG PO CAPS: 100.0000 mg | ORAL_CAPSULE | Freq: Every day | ORAL | Status: DC | PRN

## 2017-01-30 MED ORDER — VANCOMYCIN HCL 500 MG IV SOLR
INTRAVENOUS | Status: AC
  Filled 2017-01-30: qty 500

## 2017-01-30 MED ORDER — VANCOMYCIN HCL 500 MG IV SOLR
INTRAVENOUS | Status: DC | PRN
  Administered 2017-01-30: 500 mg via TOPICAL

## 2017-01-30 MED ORDER — LOSARTAN POTASSIUM 25 MG PO TABS
25.0000 mg | ORAL_TABLET | Freq: Every day | ORAL | Status: DC
  Administered 2017-01-30 – 2017-02-01 (×3): 25 mg via ORAL
  Filled 2017-01-30 (×3): qty 1

## 2017-01-30 MED ORDER — PROPOFOL 10 MG/ML IV BOLUS
INTRAVENOUS | Status: DC | PRN
  Administered 2017-01-30: 130 mg via INTRAVENOUS

## 2017-01-30 MED ORDER — 0.9 % SODIUM CHLORIDE (POUR BTL) OPTIME
TOPICAL | Status: DC | PRN
  Administered 2017-01-30 (×2): 1000 mL

## 2017-01-30 MED ORDER — VANCOMYCIN HCL IN DEXTROSE 1-5 GM/200ML-% IV SOLN
1000.0000 mg | Freq: Once | INTRAVENOUS | Status: AC
  Administered 2017-01-30: 1000 mg via INTRAVENOUS

## 2017-01-30 MED ORDER — MENTHOL 3 MG MT LOZG: 1.0000 | LOZENGE | OROMUCOSAL | Status: DC | PRN

## 2017-01-30 MED ORDER — METOCLOPRAMIDE HCL 5 MG PO TABS: 5.0000 mg | ORAL_TABLET | Freq: Three times a day (TID) | ORAL | Status: DC | PRN

## 2017-01-30 MED ORDER — VANCOMYCIN HCL IN DEXTROSE 1-5 GM/200ML-% IV SOLN
INTRAVENOUS | Status: AC
  Filled 2017-01-30: qty 200

## 2017-01-30 MED ORDER — CANAGLIFLOZIN 100 MG PO TABS: 100.0000 mg | ORAL_TABLET | Freq: Every day | ORAL | Status: DC

## 2017-01-30 SURGICAL SUPPLY — 67 items
BLADE SAW SGTL 13X75X1.27 (BLADE) ×3 IMPLANT
CALIBRATOR GLENOID VIP 5-D (SYSTAGENIX WOUND MANAGEMENT) ×3 IMPLANT
CEMENT HV SMART SET (Cement) ×3 IMPLANT
CLOSURE WOUND 1/2 X4 (GAUZE/BANDAGES/DRESSINGS) ×1
COVER SURGICAL LIGHT HANDLE (MISCELLANEOUS) ×3 IMPLANT
DRAPE INCISE IOBAN 66X45 STRL (DRAPES) ×9 IMPLANT
DRAPE U-SHAPE 47X51 STRL (DRAPES) ×6 IMPLANT
DRSG AQUACEL AG ADV 3.5X10 (GAUZE/BANDAGES/DRESSINGS) ×3 IMPLANT
DURAPREP 26ML APPLICATOR (WOUND CARE) ×3 IMPLANT
ELECT BLADE 4.0 EZ CLEAN MEGAD (MISCELLANEOUS) ×3
ELECT REM PT RETURN 9FT ADLT (ELECTROSURGICAL) ×3
ELECTRODE BLDE 4.0 EZ CLN MEGD (MISCELLANEOUS) ×1 IMPLANT
ELECTRODE REM PT RTRN 9FT ADLT (ELECTROSURGICAL) ×1 IMPLANT
FIBERTAPE TENDON COMP KIT (KITS) ×3 IMPLANT
GLENOID WITH CLEAT MEDIUM (Shoulder) ×3 IMPLANT
GLOVE BIOGEL PI IND STRL 7.0 (GLOVE) ×1 IMPLANT
GLOVE BIOGEL PI IND STRL 7.5 (GLOVE) ×1 IMPLANT
GLOVE BIOGEL PI IND STRL 8 (GLOVE) ×1 IMPLANT
GLOVE BIOGEL PI INDICATOR 7.0 (GLOVE) ×2
GLOVE BIOGEL PI INDICATOR 7.5 (GLOVE) ×2
GLOVE BIOGEL PI INDICATOR 8 (GLOVE) ×2
GLOVE ECLIPSE 7.0 STRL STRAW (GLOVE) ×3 IMPLANT
GLOVE SURG ORTHO 8.0 STRL STRW (GLOVE) ×3 IMPLANT
GOWN STRL REUS W/ TWL LRG LVL3 (GOWN DISPOSABLE) ×2 IMPLANT
GOWN STRL REUS W/ TWL XL LVL3 (GOWN DISPOSABLE) ×1 IMPLANT
GOWN STRL REUS W/TWL LRG LVL3 (GOWN DISPOSABLE) ×4
GOWN STRL REUS W/TWL XL LVL3 (GOWN DISPOSABLE) ×2
HEAD HUMERAL USP II 44/19 (Head) ×3 IMPLANT
KIT BASIN OR (CUSTOM PROCEDURE TRAY) ×3 IMPLANT
KIT BEACH CHAIR TRIMANO (MISCELLANEOUS) ×3 IMPLANT
KIT ROOM TURNOVER OR (KITS) ×3 IMPLANT
KIT SET UNIVERSAL (KITS) ×3 IMPLANT
MANIFOLD NEPTUNE II (INSTRUMENTS) ×3 IMPLANT
NDL SUT 6 .5 CRC .975X.05 MAYO (NEEDLE) ×1 IMPLANT
NEEDLE 18GX1X1/2 (RX/OR ONLY) (NEEDLE) ×3 IMPLANT
NEEDLE HYPO 25GX1X1/2 BEV (NEEDLE) IMPLANT
NEEDLE MAYO TAPER (NEEDLE) ×2
NS IRRIG 1000ML POUR BTL (IV SOLUTION) ×3 IMPLANT
PACK SHOULDER (CUSTOM PROCEDURE TRAY) ×3 IMPLANT
PAD ARMBOARD 7.5X6 YLW CONV (MISCELLANEOUS) ×6 IMPLANT
PASSER SUT SWANSON 36MM LOOP (INSTRUMENTS) IMPLANT
RESTRAINT HEAD UNIVERSAL NS (MISCELLANEOUS) ×3 IMPLANT
SLING ARM IMMOBILIZER LRG (SOFTGOODS) ×3 IMPLANT
SMARTMIX MINI TOWER (MISCELLANEOUS) ×3
SPONGE LAP 18X18 X RAY DECT (DISPOSABLE) IMPLANT
STEM APEX UNIVERSAL 6MM SHOULD (Stem) ×3 IMPLANT
STRIP CLOSURE SKIN 1/2X4 (GAUZE/BANDAGES/DRESSINGS) ×2 IMPLANT
SUCTION FRAZIER HANDLE 10FR (MISCELLANEOUS) ×2
SUCTION TUBE FRAZIER 10FR DISP (MISCELLANEOUS) ×1 IMPLANT
SUT ETHILON 3 0 PS 1 (SUTURE) ×3 IMPLANT
SUT FIBERWIRE #2 38 T-5 BLUE (SUTURE)
SUT MAXBRAID (SUTURE) IMPLANT
SUT MNCRL AB 3-0 PS2 18 (SUTURE) ×3 IMPLANT
SUT VIC AB 0 CT1 27 (SUTURE) ×2
SUT VIC AB 0 CT1 27XBRD ANBCTR (SUTURE) ×1 IMPLANT
SUT VIC AB 1 CT1 27 (SUTURE) ×4
SUT VIC AB 1 CT1 27XBRD ANBCTR (SUTURE) ×2 IMPLANT
SUT VIC AB 2-0 CT1 27 (SUTURE) ×6
SUT VIC AB 2-0 CT1 TAPERPNT 27 (SUTURE) ×3 IMPLANT
SUT VICRYL 0 UR6 27IN ABS (SUTURE) IMPLANT
SUTURE FIBERWR #2 38 T-5 BLUE (SUTURE) IMPLANT
SYR CONTROL 10ML LL (SYRINGE) ×3 IMPLANT
TOWEL OR 17X24 6PK STRL BLUE (TOWEL DISPOSABLE) ×3 IMPLANT
TOWEL OR 17X26 10 PK STRL BLUE (TOWEL DISPOSABLE) ×3 IMPLANT
TOWER SMARTMIX MINI (MISCELLANEOUS) ×1 IMPLANT
TRAY FOLEY BAG SILVER LF 16FR (CATHETERS) IMPLANT
WATER STERILE IRR 1000ML POUR (IV SOLUTION) IMPLANT

## 2017-01-30 NOTE — Anesthesia Procedure Notes (Signed)
Anesthesia Regional Block: Interscalene brachial plexus block   Pre-Anesthetic Checklist: ,, timeout performed, Correct Patient, Correct Site, Correct Laterality, Correct Procedure, Correct Position, site marked, Risks and benefits discussed,  Surgical consent,  Pre-op evaluation,  At surgeon's request and post-op pain management  Laterality: Left  Prep: chloraprep       Needles:  Injection technique: Single-shot  Needle Type: Echogenic Stimulator Needle     Needle Length: 9cm  Needle Gauge: 22     Additional Needles:   Procedures:,,,, ultrasound used (permanent image in chart),,,,  Narrative:  Start time: 01/30/2017 10:15 AM End time: 01/30/2017 10:20 AM Injection made incrementally with aspirations every 5 mL.  Performed by: Personally  Anesthesiologist: Cecile HearingURK, Jonothan Heberle EDWARD  Additional Notes: Functioning IV was confirmed and monitors were applied.  A 90mm 22ga Arrow echogenic stimulator needle was used. Sterile prep and drape, hand hygiene, and sterile gloves were used.  Negative aspiration and negative test dose prior to incremental administration of local anesthetic. The patient tolerated the procedure well.  Ultrasound guidance: relevent anatomy identified, needle position confirmed, local anesthetic spread visualized around nerve(s), vascular puncture avoided.  Image printed for medical record.

## 2017-01-30 NOTE — Consult Note (Signed)
Date: 01/30/2017               Patient Name:  Jeb Leveringnez H Keasling MRN: [id number]  DOB: 06/16/1940 Age / Sex: 76 y.o., female   PCP: Gareth MorganKnowlton, Steve, MD         Requesting Physician: Dr. August Saucerean, Corrie MckusickGregory Scott, MD    Consulting Reason:  Diabetes Management     Chief Complaint: Left Shoulder Pain  History of Present Illness: Mrs. Romeo AppleHarrison is a pleasant 76 y.o. Female with a PMHx significant for Type 2 diabetes mellitus who presented to the hospital for surgery on her left shoulder. We were consulted for management of her diabetes mellitus.   Mrs. Romeo AppleHarrison has been a diabetic for approximately 5 years and is currently on Metformin, Empagliflozin, and Glimepiride. She reports her last A1c at 7.2. She checks her blood sugar twice daily and states that her numbers are "too high." She would like to start insulin once she leaves the hospital. She denies peripheral neuropathy, nephropathy, and retinopathy. Discussed that we will recheck her A1c and place her on a sliding scale. All questions and concerns addressed.   Meds: Current Facility-Administered Medications  Medication Dose Route Frequency Provider Last Rate Last Dose  . chlorhexidine (HIBICLENS) 4 % liquid 4 application  60 mL Topical Once Cammy Copaean, Gregory Scott, MD      . chlorhexidine (HIBICLENS) 4 % liquid 4 application  60 mL Topical Once Cammy Copaean, Gregory Scott, MD      . fentaNYL (SUBLIMAZE) injection 25-50 mcg  25-50 mcg Intravenous Q5 min PRN Cecile Hearingurk, Stephen Edward, MD      . insulin aspart (novoLOG) injection 0-5 Units  0-5 Units Subcutaneous QHS Hoffman, Jessica Ratliff, DO      . [START ON 01/31/2017] insulin aspart (novoLOG) injection 0-9 Units  0-9 Units Subcutaneous TID WC Hoffman, Jessica Ratliff, DO      . lactated ringers infusion   Intravenous Continuous Cecile Hearingurk, Stephen Edward, MD 10 mL/hr at 01/30/17 1000    . lactated ringers infusion   Intravenous Continuous Epimenio SarinJarvela, Joshua R, CRNA      . midazolam (VERSED) 2 MG/2ML injection           .  ondansetron (ZOFRAN) injection 4 mg  4 mg Intravenous Once PRN Cecile Hearingurk, Stephen Edward, MD       Allergies: Allergies as of 12/14/2016 - Review Complete 10/10/2016  Allergen Reaction Noted  . Hydrocodone Itching and Nausea And Vomiting 01/12/2015  . Naproxen Other (See Comments) 06/26/2015  . Codeine Rash 01/11/2016   Past Medical History:  Diagnosis Date  . Anxiety   . Arthritis   . Asthma   . Carotid artery aneurysm [facility](HCC)    Right s/p endovascular treatment 2004  . Carpal tunnel syndrome   . Cataract   . CKD (chronic kidney disease) stage 3, GFR 30-59 ml/min (HCC)   . Depression   . Essential hypertension, benign   . GERD (gastroesophageal reflux disease)   . Glaucoma   . History of recurrent UTIs   . Hx of colonic polyps   . Hx of migraines   . Hypothyroidism   . IBS (irritable bowel syndrome)   . Low back pain   . Morbid obesity (HCC)   . Neurogenic bladder   . Osteoporosis   . Pneumonia   . Sleep apnea    Intolerant to CPAP   . Stroke (HCC)   . Type 2 diabetes mellitus with diabetic neuropathy [facility](HCC)    Past Surgical History:  Procedure Laterality  Date  . ABDOMINAL HYSTERECTOMY    . CARDIAC CATHETERIZATION     1980 or 1990  . Carpal tunnel release - bilateral  1992  . CATARACT EXTRACTION W/ INTRAOCULAR LENS IMPLANT Bilateral   . CEREBRAL ANEURYSM REPAIR  2004   Coil   . COLONOSCOPY N/A 03/04/2013   Dr. Darrick Penna: moderate diverticulosis, moderate internal hemorrhoids. Next colonoscopy in 5-10 years with history of simple adenomas.  . COLONOSCOPY  06/14/2006   JYN:[id number] ADENOMAS(2), pTICS, [place] lipoma  . ESOPHAGOGASTRODUODENOSCOPY  08/12/2007   [id number]:[id number] esophagus without evidence of Barrett's,mass, erosion ulceration or stricture.  The GE junction was at 40 cm.  The Bravo  capsule was placed at 34 cm/ Unable to appreciate a hiatal hernia in the retroflexed viewNormal duodenal bulb and second portion of the duodenum. mild gastritis.   Marland Kitchen EYE SURGERY    . SHOULDER  SURGERY     Rotator cuff repair, 2012  . SPINAL CORD STIMULATOR INSERTION    . Stomach surgery gastropexy for gastric volvulus  2009  . Total hip replacement - right  2002  . TUBAL LIGATION    . Tubular Adenoma    . VESICOVAGINAL FISTULA CLOSURE W/ TAH     Family History  Problem Relation Age of Onset  . Heart disease Mother   . Cancer Mother   . Diabetes Brother   . Cancer Brother        Prostate   . Cancer Brother        Pancreatic   . Cancer Sister        Breast   . Heart attack Sister   . Lung disease Unknown   . Asthma Unknown   . Colon cancer Neg Hx   . Colon polyps Neg Hx    Social History   Social History  . Marital status: Legally Separated    Spouse name: N/A  . Number of children: N/A  . Years of education: 12th grade   Occupational History  . Retired     [employer]  .  Retired   Social History Main Topics  . Smoking status: Former Smoker    Types: Cigarettes  . Smokeless tobacco: Never Used  . Alcohol use No  . Drug use: No  . Sexual activity: Not on file   Other Topics Concern  . Not on file   Social History Narrative  . No narrative on file   Review of Systems: Pertinent items noted in HPI and remainder of comprehensive ROS otherwise negative.  Physical Exam: Blood pressure 97/81, pulse 74, temperature 97.6 F (36.4 C), resp. rate 12, height 5' (1.524 m), weight 210 lb (95.3 kg), SpO2 97 %. BP 112/68   Pulse 65   Temp 97.7 F (36.5 C)   Resp 16   Ht 5' (1.524 m)   Wt 210 lb (95.3 kg)   SpO2 100%   BMI 41.01 kg/m   General: Obese female resting comfortably in bed  HENT: Normocephalic, atraumatic, moist mucus membranes  Pulm: Good air movement with no wheezing or crackles  CV: RRR, no murmurs, no rubs  Abdomen: Active bowel sounds, soft, no tenderness to palpation  Extremities: Left upper extremity in a sling, no LE edema  Skin: No rashes. Bandage over the anterior left shoulder  Neuro: Alert and oriented x 3  Imaging  results:  Dg Chest 2 View  Result Date: 01/30/2017 CLINICAL DATA:  Preop left shoulder replacement EXAM: CHEST  2 VIEW COMPARISON:  10/10/2016 FINDINGS: Elevation of  the left hemidiaphragm, stable. Heart is mildly enlarged. Minimal bibasilar atelectasis. No effusions or acute bony abnormality. Spinal stimulator device noted in the midthoracic spine region. IMPRESSION: Elevated left hemidiaphragm, stable.  Bibasilar atelectasis. Mild cardiomegaly. Electronically Signed   By: Charlett Nose M.D.   On: 01/30/2017 09:33   Dg Shoulder Left Port  Result Date: 01/30/2017 CLINICAL DATA:  Shoulder arthritis EXAM: LEFT SHOULDER - 1 VIEW COMPARISON:  09/01/2016 FINDINGS: Left shoulder replacement in satisfactory position and alignment. No fracture or acute complication IMPRESSION: Satisfactory left shoulder replacement. Electronically Signed   By: Marlan Palau M.D.   On: 01/30/2017 16:53   Other results: EKG: normal EKG, normal sinus rhythm, unchanged from previous tracings.  Assessment, Plan, & Recommendations by Problem: Active Problems:   Shoulder arthritis  1. Type 2 Diabetes Mellitus  - Home regimen includes Metformin, Empagliflozin, and Glimepiride. - A1c 8.1 which is elevated compare to reported A1c  - Will place on sliding scale insulin + night time coverage   2. Left Shoulder Arthritis  - S/p left shoulder replacement  - Left shoulder sling and bandage in place - Per ortho surgery   Dispo: Disposition is deferred at this time, awaiting improvement of current medical problems. Anticipated discharge per Orthopedic surgery.   The patient does have a current PCP Gareth Morgan, MD) and does not need an [facility] hospital follow-up appointment after discharge.  The patient does not have transportation limitations that hinder transportation to clinic appointments.  Signed: Levora Dredge, MD 01/30/2017, 5:26 PM

## 2017-01-30 NOTE — Brief Op Note (Signed)
01/30/2017  3:55 PM  PATIENT:  Shelby LeveringInez H [name]  76 y.o. female  PRE-OPERATIVE DIAGNOSIS:  LEFT SHOULDER OSTEOARTHRITIS  POST-OPERATIVE DIAGNOSIS:  Left shoulder osteoarthritis  PROCEDURE:  Procedure(s): LEFT TOTAL SHOULDER ARTHROPLASTY  SURGEON:  Surgeon(s): August Saucerean, Corrie MckusickGregory Scott, MD  ASSISTANT: Burnett Kanarisarla Bethyne rnfa  ANESTHESIA:   general  EBL: 100 ml    Total I/O In: -  Out: 100 [Blood:100]  BLOOD ADMINISTERED: none  DRAINS: none   LOCAL MEDICATIONS USED: marcaine mso4 clonidine  SPECIMEN:  No Specimen  COUNTS:  YES  TOURNIQUET:  * No tourniquets in log *  DICTATION: .Other Dictation: Dictation Number [id number]  PLAN OF CARE: Admit to inpatient   PATIENT DISPOSITION:  PACU - hemodynamically stable

## 2017-01-30 NOTE — Anesthesia Postprocedure Evaluation (Signed)
Anesthesia Post Note  Patient: [name]  Procedure(s) Performed: LEFT TOTAL SHOULDER ARTHROPLASTY (Left Shoulder)     Patient location during evaluation: PACU Anesthesia Type: General Level of consciousness: awake, awake and alert and oriented Pain management: pain level controlled Vital Signs Assessment: post-procedure vital signs reviewed and stable Respiratory status: spontaneous breathing, nonlabored ventilation and respiratory function stable Cardiovascular status: blood pressure returned to baseline Postop Assessment: no headache and spinal receding    Last Vitals:  Vitals:   01/30/17 1736 01/30/17 1924  BP: 128/77 102/75  Pulse:  63  Resp: 18 15  Temp: 36.5 C 36.7 C  SpO2: 97% 95%    Last Pain:  Vitals:   01/30/17 1924  TempSrc: Oral  PainSc:                  [name] COKER

## 2017-01-30 NOTE — H&P (Signed)
[name] is an 76 y.o. female.   Chief Complaint: Left shoulder pain HPI: [name] is a 76 year old patient with left shoulder pain.  Has had endstage arthritis in the shoulder for approximately a year.  She reports significant pain with activities of daily living and desires to have a more pain-free range of motion with her shoulder.  Patient is diabetic and she has seen her primary care physician for blood glucose management in August.  Patient did decrease her hemoglobin A1c into the low 7 range.  She still remains at high risk for infection complication following the surgery due to her diabetes.  This is all explained to the patient and she nonetheless wishes to proceed.  Past Medical History:  Diagnosis Date  . Anxiety   . Arthritis   . Asthma   . Carotid artery aneurysm [facility])    Right s/p endovascular treatment 2004  . Carpal tunnel syndrome   . Cataract   . CKD (chronic kidney disease) stage 3, GFR 30-59 ml/min (HCC)   . Depression   . Essential hypertension, benign   . GERD (gastroesophageal reflux disease)   . Glaucoma   . History of recurrent UTIs   . Hx of colonic polyps   . Hx of migraines   . Hypothyroidism   . IBS (irritable bowel syndrome)   . Low back pain   . Morbid obesity (HCC)   . Neurogenic bladder   . Osteoporosis   . Pneumonia   . Sleep apnea    Intolerant to CPAP   . Stroke (HCC)   . Type 2 diabetes mellitus with diabetic neuropathy [facility])     Past Surgical History:  Procedure Laterality Date  . ABDOMINAL HYSTERECTOMY    . CARDIAC CATHETERIZATION     1980 or 1990  . Carpal tunnel release - bilateral  1992  . CATARACT EXTRACTION W/ INTRAOCULAR LENS IMPLANT Bilateral   . CEREBRAL ANEURYSM REPAIR  2004   Coil   . COLONOSCOPY N/A 03/04/2013   Dr. Darrick Penna: moderate diverticulosis, moderate internal hemorrhoids. Next colonoscopy in 5-10 years with history of simple adenomas.  . COLONOSCOPY  06/14/2006   [id number] ADENOMAS(2), pTICS, [place] lipoma  .  ESOPHAGOGASTRODUODENOSCOPY  08/12/2007   MVH:QIONGE esophagus without evidence of Barrett's,mass, erosion ulceration or stricture.  The GE junction was at 40 cm.  The Bravo  capsule was placed at 34 cm/ Unable to appreciate a hiatal hernia in the retroflexed viewNormal duodenal bulb and second portion of the duodenum. mild gastritis.   Marland Kitchen EYE SURGERY    . SHOULDER SURGERY     Rotator cuff repair, 2012  . SPINAL CORD STIMULATOR INSERTION    . Stomach surgery gastropexy for gastric volvulus  2009  . Total hip replacement - right  2002  . TUBAL LIGATION    . Tubular Adenoma    . VESICOVAGINAL FISTULA CLOSURE W/ TAH      Family History  Problem Relation Age of Onset  . Heart disease Mother   . Cancer Mother   . Diabetes Brother   . Cancer Brother        Prostate   . Cancer Brother        Pancreatic   . Cancer Sister        Breast   . Heart attack Sister   . Lung disease Unknown   . Asthma Unknown   . Colon cancer Neg Hx   . Colon polyps Neg Hx    Social History:  reports  that she has quit smoking. Her smoking use included Cigarettes. She has never used smokeless tobacco. She reports that she does not drink alcohol or use drugs.  Allergies:  Allergies  Allergen Reactions  . Oxycodone Anaphylaxis and Itching  . Codeine Rash  . Hydrocodone Itching and Nausea And Vomiting  . Naproxen Nausea And Vomiting and Other (See Comments)    Hallucinations    Medications Prior to Admission  Medication Sig Dispense Refill  . colchicine 0.6 MG tablet Take 1 tablet (0.6 mg total) by mouth every 6 (six) hours. (Patient taking differently: Take 0.6 mg by mouth daily as needed (for gout). ) 40 tablet 0  . dicyclomine (BENTYL) 10 MG capsule Take 1 capsule 2-3 times daily 30 minutes before a meal for abdominal cramping and loose stools. (Patient taking differently: Take 10 mg by mouth 3 (three) times daily as needed for spasms. ) 270 capsule 1  . doxepin (SINEQUAN) 25 MG capsule Take 75 mg by mouth  at bedtime.     . empagliflozin (JARDIANCE) 25 MG TABS tablet Take 25 mg by mouth daily.    . furosemide (LASIX) 20 MG tablet Take 20 mg by mouth daily.     Marland Kitchen levothyroxine (SYNTHROID, LEVOTHROID) 75 MCG tablet Take 75 mcg by mouth daily.    . Liniments (SALONPAS EX) Apply 1 patch topically as needed (for pain).    Marland Kitchen losartan (COZAAR) 25 MG tablet Take 25 mg by mouth daily.     . meloxicam (MOBIC) 7.5 MG tablet Take 1 tablet (7.5 mg total) by mouth daily. 90 tablet 0  . metFORMIN (GLUCOPHAGE) 500 MG tablet Take 1,000 mg by mouth 2 (two) times daily with a meal.     . omeprazole (PRILOSEC) 20 MG capsule Take 20 mg by mouth 2 (two) times daily.     . phenazopyridine (PYRIDIUM) 200 MG tablet Take 200 mg by mouth 3 (three) times daily as needed for pain.    . Probiotic Product (PROBIOTIC DAILY PO) Take 1 tablet by mouth daily.    . promethazine (PHENERGAN) 25 MG tablet Take 25 mg by mouth 2 (two) times daily as needed for nausea.     . traMADol (ULTRAM) 50 MG tablet Take 50 mg by mouth every 6 (six) hours as needed for moderate pain.    Marland Kitchen albuterol (PROVENTIL) (2.5 MG/3ML) 0.083% nebulizer solution Take 3 mLs (2.5 mg total) by nebulization every 6 (six) hours as needed for wheezing or shortness of breath. 75 mL 12  . benzonatate (TESSALON) 100 MG capsule Take 1 capsule (100 mg total) by mouth every 8 (eight) hours. (Patient taking differently: Take 100 mg by mouth daily as needed for cough. ) 21 capsule 0  . Cholecalciferol (VITAMIN D3) 5000 units CAPS Take 5,000 Units by mouth daily.    . Cyanocobalamin (VITAMIN B 12 PO) Take 1 tablet by mouth daily.    . Fluticasone-Salmeterol (ADVAIR) 250-50 MCG/DOSE AEPB Inhale 1 puff into the lungs 2 (two) times daily as needed (for shortness of breath).    . gabapentin (NEURONTIN) 100 MG capsule Take 1 capsule (100 mg total) by mouth 2 (two) times daily as needed (pain). (Patient not taking: Reported on 01/19/2017) 60 capsule 2  . glimepiride (AMARYL) 4 MG  tablet Take 4 mg by mouth daily.     Marland Kitchen guaiFENesin (MUCINEX) 600 MG 12 hr tablet Take 600 mg by mouth 2 (two) times daily as needed for cough or to loosen phlegm.    Marland Kitchen HYDROmorphone (  DILAUDID) 2 MG tablet Take 1 tablet (2 mg total) by mouth every 4 (four) hours as needed for severe pain. (Patient not taking: Reported on 01/19/2017) 30 tablet 0  . PROAIR HFA 108 (90 Base) MCG/ACT inhaler Inhale 2 puffs into the lungs every 6 (six) hours as needed for wheezing.     . sulfamethoxazole-trimethoprim (BACTRIM DS,SEPTRA DS) 800-160 MG tablet Take 1 tablet by mouth 2 (two) times daily. 6 tablet 0    Results for orders placed or performed during the hospital encounter of 01/30/17 (from the past 48 hour(s))  Glucose, capillary     Status: Abnormal   Collection Time: 01/30/17  9:10 AM  Result Value Ref Range   Glucose-Capillary 173 (H) 65 - 99 mg/dL   Dg Chest 2 View  Result Date: 01/30/2017 CLINICAL DATA:  Preop left shoulder replacement EXAM: CHEST  2 VIEW COMPARISON:  10/10/2016 FINDINGS: Elevation of the left hemidiaphragm, stable. Heart is mildly enlarged. Minimal bibasilar atelectasis. No effusions or acute bony abnormality. Spinal stimulator device noted in the midthoracic spine region. IMPRESSION: Elevated left hemidiaphragm, stable.  Bibasilar atelectasis. Mild cardiomegaly. Electronically Signed   By: Charlett NoseKevin  Dover M.D.   On: 01/30/2017 09:33    Review of Systems  Musculoskeletal: Positive for joint pain.  All other systems reviewed and are negative.   Blood pressure (!) 166/80, pulse 74, temperature 98.5 F (36.9 C), temperature source Oral, resp. rate 12, height 5' (1.524 m), weight 210 lb (95.3 kg), SpO2 97 %. Physical Exam  Constitutional: She appears well-developed.  HENT:  Head: Normocephalic.  Eyes: Pupils are equal, round, and reactive to light.  Neck: Normal range of motion.  Cardiovascular: Normal rate.   Respiratory: Effort normal.  Neurological: She is alert.  Skin: Skin  is warm.   Examination the left shoulder demonstrates skin is intact.  Radial pulses intact.  Motor sensory function to the hand is intact deltoid fires.  Patient has painful but reasonable passive range of motion with external rotation to about 50 forward flexion and abduction both above 90.  There is some coarseness with passive range of motion of that left shoulder.  Rotator cuff strength feels intact to infraspinatus supraspinatus and subscap muscle testing  Assessment/Plan Impression is left shoulder arthritis.  Plan at this time is left shoulder replacement.  Patient is at higher risk due to her diabetes.  Other risks including but not limited to neurovascular damage shoulder instability and potential need for more surgery discussed with the patient.  All questions answered.  Plan for surgery today using vancomycin preoperatively as well as vancomycin powder within the incision.  We will continue to work with her primary care provider to obtain optimal blood glucose management in the perioperative period  Burnard BuntingG Scott [name] Castrogiovanni, MD 01/30/2017, 10:41 AM

## 2017-01-30 NOTE — Transfer of Care (Signed)
Immediate Anesthesia Transfer of Care Note  Patient: [name]  Procedure(s) Performed: LEFT TOTAL SHOULDER ARTHROPLASTY (Left Shoulder)  Patient Location: PACU  Anesthesia Type:General and GA combined with regional for post-op pain  Level of Consciousness: awake, sedated, patient cooperative and responds to stimulation  Airway & Oxygen Therapy: Patient Spontanous Breathing and Patient connected to nasal cannula oxygen  Post-op Assessment: Report given to RN, Post -op Vital signs reviewed and stable and Patient moving all extremities  Post vital signs: Reviewed and stable  Last Vitals:  Vitals:   01/30/17 1615 01/30/17 1617  BP: 97/81   Pulse:    Resp:    Temp:  36.4 C  SpO2:      Last Pain:  Vitals:   01/30/17 1617  TempSrc:   PainSc: Asleep      Patients Stated Pain Goal: 4 (01/30/17 0945)  Complications: No apparent anesthesia complications

## 2017-01-30 NOTE — Anesthesia Procedure Notes (Signed)
Procedure Name: Intubation Date/Time: 01/30/2017 11:51 AM Performed by: Montel Clock Pre-anesthesia Checklist: Patient identified, Emergency Drugs available, Suction available and Patient being monitored Patient Re-evaluated:Patient Re-evaluated prior to induction Oxygen Delivery Method: Circle system utilized Preoxygenation: Pre-oxygenation with 100% oxygen Induction Type: IV induction Ventilation: Mask ventilation without difficulty Laryngoscope Size: Mac and 3 Grade View: Grade I Tube type: Oral Tube size: 7.0 mm Number of attempts: 1 Airway Equipment and Method: Stylet and Oral airway Placement Confirmation: ETT inserted through vocal cords under direct vision,  positive ETCO2 and breath sounds checked- equal and bilateral Secured at: 23 cm Tube secured with: Tape Dental Injury: Teeth and Oropharynx as per pre-operative assessment

## 2017-01-30 NOTE — Anesthesia Preprocedure Evaluation (Addendum)
Anesthesia Evaluation  Patient identified by MRN, date of birth, ID band Patient awake    Reviewed: Allergy & Precautions, NPO status , Patient's Chart, lab work & pertinent test results  Airway Mallampati: II  TM Distance: >3 FB Neck ROM: Full    Dental  (+) Teeth Intact, Dental Advisory Given,    Pulmonary asthma , sleep apnea , former smoker,    Pulmonary exam normal breath sounds clear to auscultation       Cardiovascular hypertension, Pt. on medications + Peripheral Vascular Disease (s/p right carotid artery aneurysm endovascular repair)  Normal cardiovascular exam Rhythm:Regular Rate:Normal     Neuro/Psych PSYCHIATRIC DISORDERS Anxiety Depression CVA    GI/Hepatic Neg liver ROS, GERD  Medicated,  Endo/Other  diabetes, Type 2, Oral Hypoglycemic AgentsHypothyroidism Morbid obesity  Renal/GU Renal InsufficiencyRenal disease     Musculoskeletal  (+) Arthritis , Osteoarthritis,    Abdominal   Peds  Hematology negative hematology ROS (+)   Anesthesia Other Findings Day of surgery medications reviewed with the patient.  Reproductive/Obstetrics                            Anesthesia Physical Anesthesia Plan  ASA: III  Anesthesia Plan: General   Post-op Pain Management:  Regional for Post-op pain   Induction: Intravenous  PONV Risk Score and Plan: 3 and Ondansetron, Dexamethasone and Treatment may vary due to age or medical condition  Airway Management Planned: Oral ETT  Additional Equipment:   Intra-op Plan:   Post-operative Plan: Extubation in OR  Informed Consent: I have reviewed the patients History and Physical, chart, labs and discussed the procedure including the risks, benefits and alternatives for the proposed anesthesia with the patient or authorized representative who has indicated his/her understanding and acceptance.   Dental advisory given  Plan Discussed with:  CRNA  Anesthesia Plan Comments: (Risks/benefits of general anesthesia discussed with patient including risk of damage to teeth, lips, gum, and tongue, nausea/vomiting, allergic reactions to medications, and the possibility of heart attack, stroke and death.  All patient questions answered.  Patient wishes to proceed.)        Anesthesia Quick Evaluation

## 2017-01-31 ENCOUNTER — Encounter (HOSPITAL_COMMUNITY): Payer: Self-pay | Admitting: Orthopedic Surgery

## 2017-01-31 DIAGNOSIS — Z87891 Personal history of nicotine dependence: Secondary | ICD-10-CM

## 2017-01-31 DIAGNOSIS — Z803 Family history of malignant neoplasm of breast: Secondary | ICD-10-CM

## 2017-01-31 DIAGNOSIS — Z79899 Other long term (current) drug therapy: Secondary | ICD-10-CM

## 2017-01-31 DIAGNOSIS — E119 Type 2 diabetes mellitus without complications: Secondary | ICD-10-CM

## 2017-01-31 DIAGNOSIS — Z800 Family history of malignant neoplasm of digestive organs: Secondary | ICD-10-CM

## 2017-01-31 DIAGNOSIS — Z6841 Body Mass Index (BMI) 40.0 and over, adult: Secondary | ICD-10-CM

## 2017-01-31 DIAGNOSIS — Z833 Family history of diabetes mellitus: Secondary | ICD-10-CM

## 2017-01-31 DIAGNOSIS — Z8249 Family history of ischemic heart disease and other diseases of the circulatory system: Secondary | ICD-10-CM

## 2017-01-31 LAB — GLUCOSE, CAPILLARY
GLUCOSE-CAPILLARY: 175 mg/dL — AB (ref 65–99)
Glucose-Capillary: 92 mg/dL (ref 65–99)

## 2017-01-31 MED ORDER — SODIUM CHLORIDE 0.9% FLUSH
3.0000 mL | Freq: Two times a day (BID) | INTRAVENOUS | Status: DC
  Administered 2017-01-31: 3 mL via INTRAVENOUS

## 2017-01-31 MED ORDER — CANAGLIFLOZIN 100 MG PO TABS: 100.0000 mg | ORAL_TABLET | Freq: Every day | ORAL | Status: DC

## 2017-01-31 MED ORDER — METFORMIN HCL 500 MG PO TABS
1000.0000 mg | ORAL_TABLET | Freq: Two times a day (BID) | ORAL | Status: DC
  Administered 2017-01-31 (×2): 1000 mg via ORAL
  Filled 2017-01-31: qty 2

## 2017-01-31 MED ORDER — SODIUM CHLORIDE 0.9% FLUSH
3.0000 mL | INTRAVENOUS | Status: DC | PRN
  Administered 2017-01-31: 3 mL via INTRAVENOUS
  Filled 2017-01-31: qty 3

## 2017-01-31 MED ORDER — INSULIN ASPART 100 UNIT/ML ~~LOC~~ SOLN
0.0000 [IU] | Freq: Three times a day (TID) | SUBCUTANEOUS | Status: DC
  Administered 2017-01-31 (×2): 3 [IU] via SUBCUTANEOUS

## 2017-01-31 MED ORDER — SODIUM CHLORIDE 0.9 % IV SOLN
10.0000 mL/h | Freq: Every day | INTRAVENOUS | Status: DC | PRN
  Administered 2017-01-31: 10 mL/h via INTRAVENOUS

## 2017-01-31 MED ORDER — GLIMEPIRIDE 4 MG PO TABS
4.0000 mg | ORAL_TABLET | Freq: Every day | ORAL | Status: DC
  Administered 2017-02-01: 4 mg via ORAL
  Filled 2017-01-31 (×2): qty 1

## 2017-01-31 NOTE — Progress Notes (Signed)
Subjective: Pt stable - pain ok block not yet worn off   Objective: Vital signs in last 24 hours: Temp:  [97.6 F (36.4 C)-98.6 F (37 C)] 97.9 F (36.6 C) (10/31 0448) Pulse Rate:  [63-84] 66 (10/31 0448) Resp:  [11-18] 12 (10/31 0448) BP: (76-166)/(55-98) 114/61 (10/31 0448) SpO2:  [95 %-100 %] 96 % (10/31 0448) Weight:  [210 lb (95.3 kg)] 210 lb (95.3 kg) (10/30 0945)  Intake/Output from previous day: 10/30 0701 - 10/31 0700 In: 1343.8 [I.V.:1343.8] Out: 250 [Blood:250] Intake/Output this shift: No intake/output data recorded.  Exam:  No cellulitis present Compartment soft  Labs: No results for input(s): HGB in the last 72 hours. No results for input(s): WBC, RBC, HCT, PLT in the last 72 hours. No results for input(s): NA, K, CL, CO2, BUN, CREATININE, GLUCOSE, CALCIUM in the last 72 hours. No results for input(s): LABPT, INR in the last 72 hours.  Assessment/Plan: Plan ot today - dc am - thx to int med for consult re blood glucose mngmt   G Scott Dean 01/31/2017, 8:32 AM

## 2017-01-31 NOTE — Evaluation (Addendum)
Physical Therapy Evaluation Patient Details Name: [name] MRN: [id number] DOB: 12/21/1940 Today's Date: 01/31/2017   History of Present Illness  76 y.o. female s/p L TSA. PMH includes: R THA, Cerebral Aneurysm repair (2004), cardiac catheterization (1990). Type 2 DM, Stroke, HTN with neuropathy.   Clinical Impression   Patient is s/p above surgery resulting in functional limitations due to the deficits listed below (see PT Problem List). PTA, patient was mod I with all mobility using a single point cane for ambulation. Pt received assistance with ADLs from family members everyday. Today patient presents with normal strength in BLE and RUE, limitations with L shoulder strength and ROM due to surgery. Currently mod I with bed mobility and supervision. Discussed precautions and safety concerns with family. Pts dynamic balance is slightly decreased with new center of mass and will benefit from PM session to reinforce gait training with cane and focus on stairs.  Patient will benefit from skilled PT to increase their independence and safety with mobility to allow discharge to the venue listed below.       Follow Up Recommendations DC plan and follow up therapy as arranged by surgeon;Home health PT    Equipment Recommendations  None recommended by PT    Recommendations for Other Services       Precautions / Restrictions Precautions Precautions: Shoulder;Fall Shoulder Interventions: Shoulder sling/immobilizer Precaution Comments: Discusssed shoulder precautions with patient. Restrictions Weight Bearing Restrictions: Yes LUE Weight Bearing: Non weight bearing      Mobility  Bed Mobility Overal bed mobility: Modified Independent             General bed mobility comments: Pt supine<>sit with 1 UE support on bed rail   Transfers Overall transfer level: Needs assistance Equipment used: Straight cane Transfers: Sit to/from Stand Sit to Stand: Supervision         General  transfer comment: Cues for safety and use of cane with sit<>stand transfers  Ambulation/Gait Ambulation/Gait assistance: Min guard Ambulation Distance (Feet): 30 Feet Assistive device: Straight cane Gait Pattern/deviations: WFL(Within Functional Limits) Gait velocity: decreased   General Gait Details: min guarding for safety, SPC for stability at baseline.   Stairs            Wheelchair Mobility    Modified Rankin (Stroke Patients Only)       Balance Overall balance assessment: Needs assistance Sitting-balance support: Feet unsupported Sitting balance-Leahy Scale: Good     Standing balance support: Single extremity supported Standing balance-Leahy Scale: Fair                               Pertinent Vitals/Pain Pain Assessment: 0-10 Pain Score: 2  Pain Location: L shoulder Pain Descriptors / Indicators: Operative site guarding Pain Intervention(s): Limited activity within patient's tolerance;Monitored during session    Home Living Family/patient expects to be discharged to:: Private residence Living Arrangements: Children Available Help at Discharge: Available 24 hours/day Type of Home: House Home Access: Stairs to enter Entrance Stairs-Rails: None Entrance Stairs-Number of Steps: 3 Home Layout: One level Home Equipment: Environmental consultantWalker - 2 wheels;Cane - single point;Bedside commode;Grab bars - tub/shower;Wheelchair - manual Additional Comments: Daughers assist her with ADL's such as bathing     Prior Function Level of Independence: Independent with assistive device(s)         Comments: Ambulated with SPC     Hand Dominance        Extremity/Trunk Assessment   Upper  Extremity Assessment Upper Extremity Assessment: Defer to OT evaluation            Communication   Communication: No difficulties  Cognition Arousal/Alertness: Awake/alert Behavior During Therapy: Anxious;Impulsive Overall Cognitive Status: History of cognitive impairments  - at baseline                                 General Comments: Pt impatient and ill tempered, cognitive hx at baseline.       General Comments General comments (skin integrity, edema, etc.): Education and training with incentive spirometer.  BP= 144/90 with activity. SpO2= 94% at Rest on RA. SpO2=89% on RA with activity    Exercises     Assessment/Plan    PT Assessment Patient needs continued PT services  PT Problem List Decreased strength;Decreased range of motion;Decreased activity tolerance;Decreased balance;Decreased knowledge of use of DME;Decreased safety awareness;Pain       PT Treatment Interventions Stair training;Functional mobility training;Therapeutic activities;Therapeutic exercise;Balance training    PT Goals (Current goals can be found in the Care Plan section)  Acute Rehab PT Goals Patient Stated Goal: d/c home to HHPT PT Goal Formulation: With patient/family Time For Goal Achievement: 02/07/17 Potential to Achieve Goals: Good    Frequency Min 5X/week   Barriers to discharge        Co-evaluation               AM-PAC PT "6 Clicks" Daily Activity  Outcome Measure Difficulty turning over in bed (including adjusting bedclothes, sheets and blankets)?: A Lot Difficulty moving from lying on back to sitting on the side of the bed? : A Lot Difficulty sitting down on and standing up from a chair with arms (e.g., wheelchair, bedside commode, etc,.)?: A Little Help needed moving to and from a bed to chair (including a wheelchair)?: A Little Help needed walking in hospital room?: A Little Help needed climbing 3-5 steps with a railing? : A Little 6 Click Score: 16    End of Session Equipment Utilized During Treatment: Gait belt Activity Tolerance: Patient tolerated treatment well Patient left: in chair;with call bell/phone within reach;with family/visitor present Nurse Communication: Mobility status PT Visit Diagnosis: Unsteadiness on feet  (R26.81);Pain;Muscle weakness (generalized) (M62.81) Pain - Right/Left: Left    Time: 1610-9604 PT Time Calculation (min) (ACUTE ONLY): 40 min   Charges:   PT Evaluation $PT Eval Moderate Complexity: 1 Mod PT Treatments $Gait Training: 8-22 mins   PT G Codes:        Etta Grandchild, PT, DPT Acute Rehab Services Pager: [phone]    Etta Grandchild 01/31/2017, 11:31 AM

## 2017-01-31 NOTE — Progress Notes (Addendum)
   Subjective: Reason for Consult: Diabetes Management   Doing well this AM but has pain in her left shoulder. Discussed restarting her home medications and leaving some insulin coverage onboard. She wants to talk to her PCP about starting insulin once she leaves the hospital. Able to tolerate PO intake. All questions and concerns discussed.   Objective: Vital signs in last 24 hours: Vitals:   01/30/17 1736 01/30/17 1924 01/31/17 0058 01/31/17 0448  BP: 128/77 102/75 119/65 114/61  Pulse:  63 76 66  Resp: 18 15 13 12   Temp: 97.7 F (36.5 C) 98.1 F (36.7 C) 98.6 F (37 C) 97.9 F (36.6 C)  TempSrc:  Oral Oral Oral  SpO2: 97% 95% 99% 96%  Weight:      Height:       General: Obese female resting comfortably in bed  Pulm: Good air movement, no wheezing or crackles  CV: RRR, no murmurs, no rubs Abdomen: Soft, non-distended, no tenderness to palpation  Extremities: Trace pitting edema bilaterally   Assessment/Plan:  1. Diabetes Mellitus, uncontrolled  - A1c 8.1 - CBGs elevated on SSI and night time coverage  - Changed from sensitive sliding scale to moderate sliding scale  - Home medications include: Metformin, Empagliflozin, and Glimepiride. Will restart home medications. Continue mealtime coverage - Will discharge on current home medications. Will need follow-up with PCP to discuss adding another agent vs starting insulin  2. Left Shoulder Arthritis  - S/p left total shoulder replacement   Dispo: Anticipated discharge pending ortho.   Levora DredgeHelberg, Justin, MD 01/31/2017, 5:45 AM My Pager: [phone]   Internal Medicine Attending:   I saw and examined the patient. I reviewed the resident's note and I agree with the resident's findings and plan as documented in the resident's note.  Pharmacy has noted that her GFR of 34 is borderline for continuing metformin and empagliflozin. Because she is doing relatively well on these medications, I would favor continuing them for now  and allow her to follow up with her primary care physician as an outpatient. Will check a BMP tomorrow, and if GFR falls below 30, we can design an alternate discharge regimen for her.  Erlinda Honguncan Dermot Gremillion, MD

## 2017-01-31 NOTE — Progress Notes (Signed)
OT Cancellation Note  Patient Details Name: [name] H Canterbury MRN: [id number] DOB: 08/28/1940   Cancelled Treatment:    Reason Eval/Treat Not Completed: Pain limiting ability to participate; Pt reporting increased pain in LUE and throughout L side, RN aware and bringing pain meds. Pt reports unable to participate in therapy at this time due to pain. Will follow up as able.   Marcy SirenBreanna Nia Nathaniel, OT Pager [phone] 01/31/2017   Orlando PennerBreanna L Jaslyn Bansal 01/31/2017, 3:47 PM

## 2017-01-31 NOTE — Op Note (Signed)
NAME:  RAMSEY, MIDGETT                    ACCOUNT NO.:  MEDICAL RECORD NO.:  [id number]  LOCATION:                                 FACILITY:  PHYSICIAN:  Anderson Malta, M.D.         DATE OF BIRTH:  DATE OF PROCEDURE: DATE OF DISCHARGE:                              OPERATIVE REPORT   PREOPERATIVE DIAGNOSIS:  Left shoulder arthritis.  POSTOPERATIVE DIAGNOSIS:  Left shoulder arthritis.  PROCEDURE:  Left total shoulder replacement.  SURGEON:  Anderson Malta, M.D.  ASSISTANT:  Laure Kidney, RNFA.  INDICATIONS:  [name] is a 76 year old patient with end-stage shoulder arthritis, who presents for operative management after explanation of risks and benefits.  PROCEDURE IN DETAIL:  The patient was brought to the operating room where general anesthetic was induced.  Preoperative antibiotics were administered.  Time-out was called.  The patient was placed in the beach chair position with head in neutral position.  Left arm, shoulder and hand prescrubbed with alcohol and Betadine, allowed to air dry, prepped with DuraPrep solution and draped in a sterile manner.  Charlie Pitter was used to cover the entire operative field.  Deltopectoral approach was made. Skin and subcutaneous tissue were sharply divided.  Cephalic vein mobilized medially.  Deltopectoral interval developed.  Kolbel retractor placed.  The patient's rotator cuff was pristine and intact.  Biceps tendon identified and tenodesed to the pectoralis major tendon.  Rotator interval then opened up to the base of the coracoid.  Circumflex vessels identified and suture ligatured.  Axillary nerve then visualized, identified and marked with a vessel loop.  Protected at all times during the case.  At this time, the subscapularis was peeled off the lesser tuberosity.  It was tagged.  Capsular release also performed at this time.  The capsule was released off the inferior aspect of the humeral head to the 7 o'clock position.  Also elevated off the  proximal humeral shaft about 2 cm.  At this time, the head was prepared and cut in 30 degrees of retroversion.  This was done at the articular margin rotator cuff footprint interface.  Cap was placed.  Attention was then directed towards the glenoid.  Labrum excised.  Care taken to avoid injury to the axillary nerve during this maneuver.  Posterior and anterior retractors were placed and in accordance with the patient's specific guides, the central pin was placed and the glenoid was prepared and reamed.  At this time, thorough irrigation was performed and the medium-sized glenoid was placed onto the cemented in through the superior peg and inferior keel. The central peg was bone grafted.  It should be noted that thorough irrigation of the glenoid was performed prior to cementation.  At this time, after the cement hardened, attention directed towards the humerus. Humerus was broached up to a size 6 weeks, which gave excellent press- fit.  True prosthesis placed and then, it was reduced and the size 44 x 19 head gave 50% inferior translation, 50% posterior translation.  It was pointed toward the glenoid with the arm in neutral position and the patient had internal rotation parallel with the trunk with the arm  abducted to 90 degrees.  The patient could get her hand to her head. True component placed at this time.  Subscap repair then performed using Arthrex technique with the subscap tensioning kit.  This went around the prosthesis through drill holes into the lesser tuberosity.  Secure repair was achieved with the arm in about 20 degrees of external rotation.  Rotator interval closed with #1 Vicryl suture with the arm in 30 degrees of external rotation.  All-in-all, stable shoulder with good subscapularis repair was achieved.  Thorough irrigation again performed and vancomycin placed into the joint outside of the joint and then into the superficial aspect of the incision.  The deltopectoral  interval was closed using #1 Vicryl suture followed by interrupted inverted 0 Vicryl suture, 2-0 Vicryl suture and 3-0 Monocryl.  Impervious dressing placed along with a large sling.  The patient tolerated the procedure well without immediate complication.  Transferred to the recovery room in stable condition.     Anderson Malta, M.D.     GSD/MEDQ  D:  01/30/2017  T:  01/31/2017  Job:  [id number]

## 2017-02-01 DIAGNOSIS — N179 Acute kidney failure, unspecified: Secondary | ICD-10-CM

## 2017-02-01 DIAGNOSIS — E669 Obesity, unspecified: Secondary | ICD-10-CM

## 2017-02-01 DIAGNOSIS — N189 Chronic kidney disease, unspecified: Secondary | ICD-10-CM

## 2017-02-01 DIAGNOSIS — E1122 Type 2 diabetes mellitus with diabetic chronic kidney disease: Secondary | ICD-10-CM

## 2017-02-01 DIAGNOSIS — Z96612 Presence of left artificial shoulder joint: Secondary | ICD-10-CM

## 2017-02-01 DIAGNOSIS — Z8739 Personal history of other diseases of the musculoskeletal system and connective tissue: Secondary | ICD-10-CM

## 2017-02-01 DIAGNOSIS — Z7984 Long term (current) use of oral hypoglycemic drugs: Secondary | ICD-10-CM

## 2017-02-01 LAB — BASIC METABOLIC PANEL
ANION GAP: 13 (ref 5–15)
ANION GAP: 14 (ref 5–15)
BUN: 22 mg/dL — ABNORMAL HIGH (ref 6–20)
BUN: 21 mg/dL — ABNORMAL HIGH (ref 6–20)
CHLORIDE: 105 mmol/L (ref 101–111)
CHLORIDE: 103 mmol/L (ref 101–111)
CO2: 18 mmol/L — AB (ref 22–32)
CO2: 18 mmol/L — ABNORMAL LOW (ref 22–32)
CREATININE: 2.17 mg/dL — AB (ref 0.44–1.00)
Calcium: 8.4 mg/dL — ABNORMAL LOW (ref 8.9–10.3)
Calcium: 8.6 mg/dL — ABNORMAL LOW (ref 8.9–10.3)
Creatinine, Ser: 2.3 mg/dL — ABNORMAL HIGH (ref 0.44–1.00)
GFR calc Af Amer: 22 mL/min — ABNORMAL LOW (ref >60)
GFR calc non Af Amer: 19 mL/min — ABNORMAL LOW (ref >60)
GFR calc non Af Amer: 21 mL/min — ABNORMAL LOW (ref >60)
GFR, EST AFRICAN AMERICAN: 24 mL/min — AB (ref >60)
GLUCOSE: 111 mg/dL — AB (ref 65–99)
Glucose, Bld: 186 mg/dL — ABNORMAL HIGH (ref 65–99)
POTASSIUM: 4.7 mmol/L (ref 3.5–5.1)
Potassium: 4.7 mmol/L (ref 3.5–5.1)
SODIUM: 135 mmol/L (ref 135–145)
Sodium: 136 mmol/L (ref 135–145)

## 2017-02-01 LAB — GLUCOSE, CAPILLARY
GLUCOSE-CAPILLARY: 115 mg/dL — AB (ref 65–99)
Glucose-Capillary: 165 mg/dL — ABNORMAL HIGH (ref 65–99)
Glucose-Capillary: 71 mg/dL (ref 65–99)

## 2017-02-01 MED ORDER — HYDROMORPHONE HCL 2 MG PO TABS: 2.0000 mg | ORAL_TABLET | ORAL | 0 refills | Status: DC | PRN

## 2017-02-01 MED ORDER — SODIUM CHLORIDE 0.9 % IV BOLUS (SEPSIS)
500.0000 mL | Freq: Once | INTRAVENOUS | Status: AC
  Administered 2017-02-01: 500 mL via INTRAVENOUS

## 2017-02-01 NOTE — Progress Notes (Signed)
   Subjective: States that she is going home today after working with PT/OT. Discussed that her labs showed an elevation in her creatinine and we will need to hold her metformin and empagliflozin. Giving her fluids and will recheck her BMP at noon. Will need close follow-up with her PCP. All questions and concern addressed.   Objective: Vital signs in last 24 hours: Vitals:   01/31/17 0448 01/31/17 1412 01/31/17 2100 02/01/17 0438  BP: 114/61 129/61  (!) 143/112  Pulse: 66 83 (!) 58 69  Resp: 12 15 18 18   Temp: 97.9 F (36.6 C) 98.4 F (36.9 C) 98.2 F (36.8 C) 98.3 F (36.8 C)  TempSrc: Oral Oral Oral Oral  SpO2: 96% 95% 96% 99%  Weight:      Height:       General: Obese female resting comfortably in bed Pulm: Good air movement, no wheezing or crackles  CV: RRR, no murmurs, no rubs  Abdomen: Active bowel sounds, sound, no tenderness to palpation  Extremities: No LE edema   Assessment/Plan:  1. Diabetes Mellitus, uncontrolled  - A1c 8.1 - CBGs better controlled over the interval  - Will need to hold metformin and empagliflozin in the setting of AKI. Will give fluids as it is likely pre-renal etiology. Can continue glimepiride - Continue insulin therapy   2. Left Shoulder Arthritis  - S/p left total shoulder replacement   3. Acute on chronic kidney disease - Baseline creatine 1.2-1.3  - Creatinine 2.3 this AM  - Give 500cc bolus, recheck BMP at noon  - Hold Metformin and Empagliflozin - Can continue glimepiride  Dispo: Anticipated discharge pending ortho.   Levora DredgeHelberg, Quandra Fedorchak, MD 02/01/2017, 6:33 AM My Pager: [phone]

## 2017-02-01 NOTE — Progress Notes (Signed)
  Date: 02/01/2017  Patient name: [name]  Medical record number: [id number]  Date of birth: 10/01/1940   I have seen and evaluated this patient and I have discussed the plan of care with the house staff. Please see their note for complete details. I concur with their findings with the following additions/corrections: [name] was in fine spirits today. Pt is stable per ortho issues but now with AKI. Likely pre-renal due to peri-surgery issues. No contrast, nephrotic meds, Urnating well so unlikely post-renal. Dr Caron PresumeHelberg giving IVF and rechecking Cr later today. We must see downward trend of Cr to ensure AKI is resolving.  Burns SpainButcher, Elizabeth A, MD 02/01/2017, 11:35 AM

## 2017-02-01 NOTE — Progress Notes (Signed)
Subjective: Pt stable - pain ok   Objective: Vital signs in last 24 hours: Temp:  [98.2 F (36.8 C)-98.4 F (36.9 C)] 98.3 F (36.8 C) (11/01 0438) Pulse Rate:  [58-83] 69 (11/01 0438) Resp:  [15-18] 18 (11/01 0438) BP: (129-143)/(61-112) 143/112 (11/01 0438) SpO2:  [95 %-99 %] 99 % (11/01 0438)  Intake/Output from previous day: 10/31 0701 - 11/01 0700 In: 780 [P.O.:780] Out: -  Intake/Output this shift: No intake/output data recorded.  Exam:  No cellulitis present Compartment soft  Labs: No results for input(s): HGB in the last 72 hours. No results for input(s): WBC, RBC, HCT, PLT in the last 72 hours.  Recent Labs  02/01/17 0547  NA 136  K 4.7  CL 105  CO2 18*  BUN 22*  CREATININE 2.30*  GLUCOSE 111*  CALCIUM 8.4*   No results for input(s): LABPT, INR in the last 72 hours.  Assessment/Plan: Plan dc this afternoon after ot and pt sessions x 2   G Dorene GrebeScott Ayeden Gladman 02/01/2017, 8:34 AM

## 2017-02-01 NOTE — Evaluation (Signed)
Occupational Therapy Evaluation Patient Details Name: [name] MRN: [id number] DOB: 10/31/1940 Today's Date: 02/01/2017    History of Present Illness 76 y.o. female s/p L TSA. PMH includes: R THA, Cerebral Aneurysm repair (2004), cardiac catheterization (1990). Type 2 DM, Stroke, HTN with neuropathy.    Clinical Impression   This 76 y/o F presents with the above. Pt lives with spouse, at baseline was using [facility] for functional mobility and receiving assistance from her daughter for ADL completion. Pt currently requires MinGuard-MinA for mobility using SPC, MaxA for ADLs secondary to LUE functional limitations. Reviewed and educated Pt and Pt's family on shoulder precautions and safe completion of ADLs while adhering to precautions with Pt/Pt's family verbalizing and return demonstrating understanding throughout. Feel Pt will safely progress home with available family assist. Will continue to follow while Pt remains in acute setting to further progress Pt's safety and independence with ADLs and mobility.     Follow Up Recommendations  DC plan and follow up therapy as arranged by surgeon;Supervision/Assistance - 24 hour    Equipment Recommendations  None recommended by OT           Precautions / Restrictions Precautions Precautions: Shoulder;Fall Type of Shoulder Precautions: Passive Protocol - sling on at all times except ADL/exercise; NWB LUE; okay for AROM to elbow, wrist, and hand to tolerance, okay for gentle pendulums, No AROM to shoulder, PROM FF 0-90, ABD 0-60, ER 0-30 Shoulder Interventions: Shoulder sling/immobilizer;Off for dressing/bathing/exercises Precaution Booklet Issued: Yes (comment) Precaution Comments: Educated and reviewed shoulder precautions with patient/Pt's family and handout provided  Required Braces or Orthoses: Sling Restrictions Weight Bearing Restrictions: Yes LUE Weight Bearing: Non weight bearing      Mobility Bed Mobility Overal bed mobility:  Modified Independent             General bed mobility comments: Pt OOB with PT; educated on exiting bed to R side if able at home to increase adherence to NWB in LUE   Transfers Overall transfer level: Needs assistance Equipment used: Straight cane Transfers: Sit to/from Stand Sit to Stand: Supervision         General transfer comment: Supervision for safety, pt useing SPC for support with sit<>stand transfers    Balance Overall balance assessment: Needs assistance Sitting-balance support: Feet unsupported Sitting balance-Leahy Scale: Good     Standing balance support: Single extremity supported Standing balance-Leahy Scale: Fair Standing balance comment: Min steady assist provided while completing pendulums                            ADL either performed or assessed with clinical judgement   ADL Overall ADL's : Needs assistance/impaired Eating/Feeding: Set up;Sitting   Grooming: Set up;Sitting   Upper Body Bathing: Minimal assistance;Sitting   Lower Body Bathing: Moderate assistance;Sit to/from stand   Upper Body Dressing : Maximal assistance;Sitting Upper Body Dressing Details (indicate cue type and reason): donning new gown, educated on compensatory techniques for UB dressing; maxA to don sling, Pt's daughter present and providing assist to don sling with verbal cues, education, and assistance from therapist  Lower Body Dressing: Maximal assistance;Sit to/from stand   Toilet Transfer: Minimal assistance;Ambulation;BSC Toilet Transfer Details (indicate cue type and reason): BSC over toilet; simulated in transfer to/from recliner  Toileting- Clothing Manipulation and Hygiene: Moderate assistance;Sit to/from Nurse, children'sstand     Tub/Shower Transfer Details (indicate cue type and reason): educated on safe transfer technique when stepping into/out of tub; educated  on keeping sling on until safely seated in tub before removing; recommend Pt have supervision during tub  transfers initially after return home with Pt/Pt's family verbalizing understanding  Functional mobility during ADLs: Minimal assistance;Cane General ADL Comments: Pt's daughter present and reporting working as a Lawyer, is familiar with offering ADL assistance; Pt's daughter reports plans to assist Pt with ADLs after return home (had been assisting prior to admission); education provided to both Pt/Pt's daughter on safety and compensatory techniques for completing ADLs while adhering to shoulder precautions with Pt/Pt's daughter verbalizing and return demonstrating understanding                          Pertinent Vitals/Pain Pain Assessment: Faces Pain Score: 2  Faces Pain Scale: Hurts even more Pain Location: L shoulder Pain Descriptors / Indicators: Operative site guarding;Grimacing Pain Intervention(s): Monitored during session;Repositioned;Limited activity within patient's tolerance     Hand Dominance Right   Extremity/Trunk Assessment Upper Extremity Assessment Upper Extremity Assessment: LUE deficits/detail LUE Deficits / Details: s/p L TSA LUE: Unable to fully assess due to pain;Unable to fully assess due to immobilization   Lower Extremity Assessment Lower Extremity Assessment: Generalized weakness       Communication Communication Communication: No difficulties   Cognition Arousal/Alertness: Awake/alert Behavior During Therapy: Anxious;Impulsive Overall Cognitive Status: History of cognitive impairments - at baseline                                 General Comments: cognitive hx at baseline, improved demeanor in light of d/c pending today; requires cues to attend to task at hand as Pt easily distracted if other activity is occurring in the room     General Comments  Pt's family present and providing education on safey, shoulder precautions, HEP, and ADL completion while adhering to shoulder precautions after return home     Exercises Shoulder  Exercises Pendulum Exercise: PROM;Left;10 reps;Standing (multimodal cues to complete as Pt has increased difficulty understanding instructions, provided demonstration, cues, and Pt practicing return demonstration) Shoulder ABduction: PROM;5 reps;Left;Seated Elbow Flexion: AROM;10 reps;Left;Seated Elbow Extension: AROM;10 reps;Left;Seated Wrist Flexion: AROM;10 reps;Left;Seated Wrist Extension: AROM;10 reps;Left;Seated Digit Composite Flexion: AROM;10 reps;Left;Seated Composite Extension: AROM;10 reps;Left;Seated Neck Flexion: AROM;Seated Neck Extension: AROM;Seated Neck Lateral Flexion - Right: AROM;Seated Neck Lateral Flexion - Left: AROM;Seated Hand Exercises Forearm Supination: AROM;10 reps;Seated;Left Forearm Pronation: AROM;10 reps;Seated;Left   Shoulder Instructions Shoulder Instructions Donning/doffing shirt without moving shoulder: Maximal assistance;Caregiver independent with task Method for sponge bathing under operated UE: Minimal assistance;Caregiver independent with task Donning/doffing sling/immobilizer: Maximal assistance;Caregiver independent with task Correct positioning of sling/immobilizer: Maximal assistance;Caregiver independent with task Pendulum exercises (written home exercise program): Moderate assistance ROM for elbow, wrist and digits of operated UE: Min-guard;Caregiver independent with task;Patient able to independently direct caregiver Sling wearing schedule (on at all times/off for ADL's): Minimal assistance;Caregiver independent with task Proper positioning of operated UE when showering: Minimal assistance;Caregiver independent with task Positioning of UE while sleeping: Minimal assistance;Caregiver independent with task    Home Living Family/patient expects to be discharged to:: Private residence Living Arrangements: Children Available Help at Discharge: Available 24 hours/day Type of Home: House Home Access: Stairs to enter [employer] of  Steps: 3 Entrance Stairs-Rails: None Home Layout: One level     Bathroom Shower/Tub: Tub only   Firefighter: Handicapped height Bathroom Accessibility: Yes   Home Equipment: Environmental consultant - 2 wheels;Cane - single point;Bedside  commode;Grab bars - tub/shower;Wheelchair - manual   Additional Comments: Daughers assist her with ADL's such as bathing       Prior Functioning/Environment Level of Independence: Independent with assistive device(s)        Comments: Ambulated with SPC        OT Problem List: Decreased strength;Impaired balance (sitting and/or standing);Decreased knowledge of precautions;Decreased knowledge of use of DME or AE;Impaired UE functional use;Decreased activity tolerance;Decreased range of motion;Pain      OT Treatment/Interventions: Self-care/ADL training;DME and/or AE instruction;Therapeutic activities;Balance training;Therapeutic exercise;Energy conservation;Patient/family education    OT Goals(Current goals can be found in the care plan section) Acute Rehab OT Goals Patient Stated Goal: d/c home to HHPT OT Goal Formulation: With patient Time For Goal Achievement: 02/15/17 Potential to Achieve Goals: Good  OT Frequency: Min 2X/week                             AM-PAC PT "6 Clicks" Daily Activity     Outcome Measure Help from another person eating meals?: None Help from another person taking care of personal grooming?: A Little Help from another person toileting, which includes using toliet, bedpan, or urinal?: A Lot Help from another person bathing (including washing, rinsing, drying)?: A Lot Help from another person to put on and taking off regular upper body clothing?: A Lot Help from another person to put on and taking off regular lower body clothing?: A Lot 6 Click Score: 15   End of Session Equipment Utilized During Treatment: Other (comment) (sling ) Nurse Communication: Mobility status  Activity Tolerance: Patient tolerated  treatment well Patient left: in chair;with call bell/phone within reach;with family/visitor present  OT Visit Diagnosis: Muscle weakness (generalized) (M62.81);Pain Pain - Right/Left: Left Pain - part of body: Shoulder                Time: 1610-9604 OT Time Calculation (min): 55 min Charges:  OT Evaluation $OT Eval Moderate Complexity: 1 Mod OT Treatments $Self Care/Home Management : 23-37 mins G-Codes:     Marcy Siren, OT Pager [phone] 02/01/2017   Orlando Penner 02/01/2017, 11:48 AM

## 2017-02-01 NOTE — Care Management Note (Signed)
Case Management Note  Patient Details  Name: [name] MRN: [id number] Date of Birth: 08/13/1940  Subjective/Objective:  76 yr old female s/p left shoulder arthroplasty.                  Action/Plan: Case manager spoke with patient and family concerning discharge plan. Choice for Home Health agency was offered. Patient requests [facility]. CM called referral to Shaune LeeksJermaine Jenkins, [facility] Liaison. Patient will have family support at discharge.    Expected Discharge Date:  02/01/17               Expected Discharge Plan:  Home w Home Health Services  In-House Referral:  NA  Discharge planning Services  CM Consult  Post Acute Care Choice:  Durable Medical Equipment Choice offered to:  Patient, Spouse, Adult Children  DME Arranged:   (shoulder abduction sling) DME Agency:  [facility]  HH Arranged:  Nurse's Aide, PT HH Agency:  [facility]  Status of Service:  Completed, signed off  If discussed at Long Length of Stay Meetings, dates discussed:    Additional Comments:  Durenda GuthrieBrady, Kyrese Gartman [name], RN 02/01/2017, 12:01 PM

## 2017-02-01 NOTE — Discharge Instructions (Signed)
Please stop taking your Metformin and Jardiance until you have followed up with your primary care doctor. Follow-up with him as soon as possible.

## 2017-02-01 NOTE — Progress Notes (Signed)
Discharge instructions and prescriptions reviewed with patient and husband, verbalized understanding. Patient refused to wait for 2nd session of PT or OT. IV removed, catheter tip intact. No questions/concerns at this time. Patient transported via wheelchair to lobby for discharge.

## 2017-02-01 NOTE — Progress Notes (Signed)
Physical Therapy Treatment Patient Details Name: [name] MRN: [id number] DOB: [birth year] Today's Date: 02/01/2017    History of Present Illness 76 y.o. female s/p L TSA. PMH includes: R THA, Cerebral Aneurysm repair (2004), cardiac catheterization (1990). Type 2 DM, Stroke, HTN with neuropathy.     PT Comments    Session focused on stair training and reinforcing safety concepts from last session. Pt demonstrating improved independence with transfers and gait, ascended/descneded 4 stairs with SPC and safe technique. Pt and family educated on safety considerations for home and have no further questions or concerns at this time. Pt has met all functional goals and will benefit from skilled home health PT when medically cleared for d/c.      Follow Up Recommendations  DC plan and follow up therapy as arranged by surgeon;Home health PT     Equipment Recommendations  None recommended by PT    Recommendations for Other Services       Precautions / Restrictions Precautions Precautions: Shoulder;Fall Shoulder Interventions: Shoulder sling/immobilizer Precaution Comments: Discusssed shoulder precautions with patient. Restrictions Weight Bearing Restrictions: Yes LUE Weight Bearing: Non weight bearing    Mobility  Bed Mobility Overal bed mobility: Modified Independent             General bed mobility comments: Pt supine<>sit with 1 UE support on bed rail   Transfers Overall transfer level: Modified independent Equipment used: Straight cane Transfers: Sit to/from Stand Sit to Stand: Supervision         General transfer comment: Supervision for safety, pt useing SPC for support with sit<>stand transfers  Ambulation/Gait Ambulation/Gait assistance: Supervision Ambulation Distance (Feet): 100 Feet Assistive device: Straight cane Gait Pattern/deviations: WFL(Within Functional Limits) Gait velocity: decreased   General Gait Details: close supervision for safety, SPC  for stability at baseline.    Stairs Stairs: Yes   Stair Management: No rails;With cane Number of Stairs: 4 General stair comments: 1 platform stair x 4 with cues for safe use of cane.   Wheelchair Mobility    Modified Rankin (Stroke Patients Only)       Balance Overall balance assessment: Needs assistance Sitting-balance support: Feet unsupported Sitting balance-Leahy Scale: Good     Standing balance support: Single extremity supported Standing balance-Leahy Scale: Fair                              Cognition Arousal/Alertness: Awake/alert Behavior During Therapy: Anxious;Impulsive Overall Cognitive Status: History of cognitive impairments - at baseline                                 General Comments: cognitive hx at baseline, improved demeanor in light of d/c pending today.       Exercises      General Comments General comments (skin integrity, edema, etc.): Reinforced incentive spriometer use. Answered qusetions with patient and family on d/c recomendations for home safety.       Pertinent Vitals/Pain Pain Assessment: 0-10 Pain Score: 2  Pain Location: L shoulder Pain Descriptors / Indicators: Operative site guarding Pain Intervention(s): Monitored during session;Limited activity within patient's tolerance    Home Living                      Prior Function            PT Goals (current goals can now be found in  the care plan section) Acute Rehab PT Goals Patient Stated Goal: d/c home to HHPT PT Goal Formulation: With patient/family Time For Goal Achievement: 02/07/17 Potential to Achieve Goals: Good Progress towards PT goals: Goals met/education completed, patient discharged from PT    Frequency           PT Plan Current plan remains appropriate    Co-evaluation              AM-PAC PT "6 Clicks" Daily Activity  Outcome Measure  Difficulty turning over in bed (including adjusting bedclothes,  sheets and blankets)?: A Lot Difficulty moving from lying on back to sitting on the side of the bed? : A Little Difficulty sitting down on and standing up from a chair with arms (e.g., wheelchair, bedside commode, etc,.)?: A Little Help needed moving to and from a bed to chair (including a wheelchair)?: A Little Help needed walking in hospital room?: A Little Help needed climbing 3-5 steps with a railing? : A Little 6 Click Score: 17    End of Session Equipment Utilized During Treatment: Gait belt Activity Tolerance: Patient tolerated treatment well Patient left: in chair;with call bell/phone within reach;with family/visitor present Nurse Communication: Mobility status PT Visit Diagnosis: Unsteadiness on feet (R26.81);Pain;Muscle weakness (generalized) (M62.81) Pain - Right/Left: Left Pain - part of body: Shoulder     Time: 3419-3790 PT Time Calculation (min) (ACUTE ONLY): 20 min  Charges:  $Gait Training: 8-22 mins                    G Codes:       Reinaldo Berber, PT, DPT Acute Rehab Services Pager: [phone]     Reinaldo Berber 02/01/2017, 9:59 AM

## 2017-02-05 MED ORDER — VANCOMYCIN HCL IN DEXTROSE 1-5 GM/200ML-% IV SOLN: 1000.0000 mg | INTRAVENOUS | Status: AC

## 2017-02-05 NOTE — Discharge Summary (Signed)
Physician Discharge Summary  Patient ID: [name] MRN: [id number] DOB/AGE: 04-11-1940 76 y.o.  Admit date: 01/30/2017 Discharge date: 02/01/2017 Admission Diagnoses:  Active Problems:   Shoulder arthritis   AKI (acute kidney injury) [facility])   Discharge Diagnoses:  Same  Surgeries: Procedure(s): LEFT TOTAL SHOULDER ARTHROPLASTY on 01/30/2017   Consultants:   Discharged Condition: Stable  Hospital Course: [name] is an 76 y.o. female who was admitted 01/30/2017 with a chief complaint of left shoulder pain, and found to have a diagnosis of left shoulder arthritis.  They were brought to the operating room on 01/30/2017 and underwent the above named procedures.  Patient tolerated the procedure well and her pain was under good control postop day #1.  She was seen by internal medicine to optimize postoperative blood glucose control.  She was seen by occupational therapy to initiate range of motion of the shoulder.  She was discharged to home in good condition on postoperative day #2.  She will continue with home health physical therapy for range of motion of the shoulder.  I will see her back in 10 days  Antibiotics given:  Anti-infectives (From admission, onward)   Start     Dose/Rate Route Frequency Ordered Stop   01/31/17 1200  vancomycin (VANCOCIN) IVPB 1000 mg/200 mL premix     1,000 mg 200 mL/hr over 60 Minutes Intravenous Every 12 hours 01/30/17 1750 01/31/17 1400   01/30/17 1420  vancomycin (VANCOCIN) powder  Status:  Discontinued       As needed 01/30/17 1423 01/30/17 1610   01/30/17 0945  vancomycin (VANCOCIN) IVPB 1000 mg/200 mL premix     1,000 mg 200 mL/hr over 60 Minutes Intravenous  Once 01/30/17 0934 01/30/17 1254   01/30/17 0937  vancomycin (VANCOCIN) 1-5 GM/200ML-% IVPB    Comments:  Shireen Quan   : cabinet override      01/30/17 0937 01/30/17 1154    .  Recent vital signs:  Vitals:   02/01/17 0857 02/01/17 1217  BP: (!) 126/104 (!) 144/100  Pulse:     Resp:    Temp:    SpO2: 100%     Recent laboratory studies:  Results for orders placed or performed during the hospital encounter of 01/30/17  Hemoglobin A1c  Result Value Ref Range   Hgb A1c MFr Bld 8.1 (H) 4.8 - 5.6 %   Mean Plasma Glucose 185.77 mg/dL  Glucose, capillary  Result Value Ref Range   Glucose-Capillary 173 (H) 65 - 99 mg/dL  Glucose, capillary  Result Value Ref Range   Glucose-Capillary 130 (H) 65 - 99 mg/dL  Glucose, capillary  Result Value Ref Range   Glucose-Capillary 267 (H) 65 - 99 mg/dL  Glucose, capillary  Result Value Ref Range   Glucose-Capillary 175 (H) 65 - 99 mg/dL  Basic metabolic panel  Result Value Ref Range   Sodium 136 135 - 145 mmol/L   Potassium 4.7 3.5 - 5.1 mmol/L   Chloride 105 101 - 111 mmol/L   CO2 18 (L) 22 - 32 mmol/L   Glucose, Bld 111 (H) 65 - 99 mg/dL   BUN 22 (H) 6 - 20 mg/dL   Creatinine, Ser 6.29 (H) 0.44 - 1.00 mg/dL   Calcium 8.4 (L) 8.9 - 10.3 mg/dL   GFR calc non Af Amer 19 (L) >60 mL/min   GFR calc Af Amer 22 (L) >60 mL/min   Anion gap 13 5 - 15  Glucose, capillary  Result Value Ref Range  Glucose-Capillary 92 65 - 99 mg/dL  Glucose, capillary  Result Value Ref Range   Glucose-Capillary 165 (H) 65 - 99 mg/dL  Glucose, capillary  Result Value Ref Range   Glucose-Capillary 71 65 - 99 mg/dL  Basic metabolic panel  Result Value Ref Range   Sodium 135 135 - 145 mmol/L   Potassium 4.7 3.5 - 5.1 mmol/L   Chloride 103 101 - 111 mmol/L   CO2 18 (L) 22 - 32 mmol/L   Glucose, Bld 186 (H) 65 - 99 mg/dL   BUN 21 (H) 6 - 20 mg/dL   Creatinine, Ser 1.61 (H) 0.44 - 1.00 mg/dL   Calcium 8.6 (L) 8.9 - 10.3 mg/dL   GFR calc non Af Amer 21 (L) >60 mL/min   GFR calc Af Amer 24 (L) >60 mL/min   Anion gap 14 5 - 15  Glucose, capillary  Result Value Ref Range   Glucose-Capillary 115 (H) 65 - 99 mg/dL    Discharge Medications:   Allergies as of 02/01/2017      Reactions   Oxycodone Anaphylaxis, Itching   Codeine Rash    Hydrocodone Itching, Nausea And Vomiting   Naproxen Nausea And Vomiting, Other (See Comments)   Hallucinations      Medication List    STOP taking these medications   gabapentin 100 MG capsule Commonly known as:  NEURONTIN   glimepiride 4 MG tablet Commonly known as:  AMARYL   metFORMIN 500 MG tablet Commonly known as:  GLUCOPHAGE   PROBIOTIC DAILY PO   promethazine 25 MG tablet Commonly known as:  PHENERGAN   SALONPAS EX   sulfamethoxazole-trimethoprim 800-160 MG tablet Commonly known as:  BACTRIM DS,SEPTRA DS   traMADol 50 MG tablet Commonly known as:  ULTRAM     TAKE these medications   albuterol (2.5 MG/3ML) 0.083% nebulizer solution Commonly known as:  PROVENTIL Take 3 mLs (2.5 mg total) by nebulization every 6 (six) hours as needed for wheezing or shortness of breath.   PROAIR HFA 108 (90 Base) MCG/ACT inhaler Generic drug:  albuterol Inhale 2 puffs into the lungs every 6 (six) hours as needed for wheezing.   benzonatate 100 MG capsule Commonly known as:  TESSALON Take 1 capsule (100 mg total) by mouth every 8 (eight) hours. What changed:    when to take this  reasons to take this   colchicine 0.6 MG tablet Take 1 tablet (0.6 mg total) by mouth every 6 (six) hours. What changed:    when to take this  reasons to take this   dicyclomine 10 MG capsule Commonly known as:  BENTYL Take 1 capsule 2-3 times daily 30 minutes before a meal for abdominal cramping and loose stools. What changed:    how much to take  how to take this  when to take this  reasons to take this  additional instructions   doxepin 25 MG capsule Commonly known as:  SINEQUAN Take 75 mg by mouth at bedtime.   Fluticasone-Salmeterol 250-50 MCG/DOSE Aepb Commonly known as:  ADVAIR Inhale 1 puff into the lungs 2 (two) times daily as needed (for shortness of breath).   furosemide 20 MG tablet Commonly known as:  LASIX Take 20 mg by mouth daily.   guaiFENesin 600 MG 12  hr tablet Commonly known as:  MUCINEX Take 600 mg by mouth 2 (two) times daily as needed for cough or to loosen phlegm.   HYDROmorphone 2 MG tablet Commonly known as:  DILAUDID Take 1 tablet (  2 mg total) by mouth every 4 (four) hours as needed for severe pain.   JARDIANCE 25 MG Tabs tablet Generic drug:  empagliflozin Take 25 mg by mouth daily.   levothyroxine 75 MCG tablet Commonly known as:  SYNTHROID, LEVOTHROID Take 75 mcg by mouth daily.   losartan 25 MG tablet Commonly known as:  COZAAR Take 25 mg by mouth daily.   meloxicam 7.5 MG tablet Commonly known as:  MOBIC Take 1 tablet (7.5 mg total) by mouth daily.   omeprazole 20 MG capsule Commonly known as:  PRILOSEC Take 20 mg by mouth 2 (two) times daily.   phenazopyridine 200 MG tablet Commonly known as:  PYRIDIUM Take 200 mg by mouth 3 (three) times daily as needed for pain.   VITAMIN B 12 PO Take 1 tablet by mouth daily.   Vitamin D3 5000 units Caps Take 5,000 Units by mouth daily.       Diagnostic Studies: Dg Chest 2 View  Result Date: 01/30/2017 CLINICAL DATA:  Preop left shoulder replacement EXAM: CHEST  2 VIEW COMPARISON:  10/10/2016 FINDINGS: Elevation of the left hemidiaphragm, stable. Heart is mildly enlarged. Minimal bibasilar atelectasis. No effusions or acute bony abnormality. Spinal stimulator device noted in the midthoracic spine region. IMPRESSION: Elevated left hemidiaphragm, stable.  Bibasilar atelectasis. Mild cardiomegaly. Electronically Signed   By: Charlett NoseKevin  Dover M.D.   On: 01/30/2017 09:33   Ct Shoulder Left Wo Contrast  Result Date: 01/25/2017 CLINICAL DATA:  Preop for total shoulder arthroplasty. EXAM: CT OF THE UPPER LEFT EXTREMITY WITHOUT CONTRAST TECHNIQUE: Multidetector CT imaging of the upper left extremity was performed according to the standard protocol. COMPARISON:  MRI left shoulder 09/14/2016 FINDINGS: Advanced glenohumeral joint degenerative changes with cartilage loss, joint  space narrowing, osteophytic spurring and subchondral cystic change. No significant widening or thinning of the glenoid. No osteochondral lesions or avascular necrosis. Mild AC joint degenerative changes. The acromion is type 2 in shape. The rotator cuff tendons are grossly intact. The shoulder musculature appears normal. The visualized left lung is clear. No worrisome pulmonary lesions. Aortic and coronary artery calcifications are noted. IMPRESSION: 1. Advanced glenohumeral joint degenerative changes as described above. 2. Grossly intact rotator cuff tendons and normal shoulder musculature. Electronically Signed   By: Rudie MeyerP.  Gallerani M.D.   On: 01/25/2017 15:16   Dg Shoulder Left Port  Result Date: 01/30/2017 CLINICAL DATA:  Shoulder arthritis EXAM: LEFT SHOULDER - 1 VIEW COMPARISON:  09/01/2016 FINDINGS: Left shoulder replacement in satisfactory position and alignment. No fracture or acute complication IMPRESSION: Satisfactory left shoulder replacement. Electronically Signed   By: Marlan Palauharles  Clark M.D.   On: 01/30/2017 16:53    Disposition: 01-Home or Self Care  Discharge Instructions    Call MD / Call 911   Complete by:  As directed    If you experience chest pain or shortness of breath, CALL 911 and be transported to the hospital emergency room.  If you develope a fever above 101 F, pus (white drainage) or increased drainage or redness at the wound, or calf pain, call your surgeon's office.   Constipation Prevention   Complete by:  As directed    Drink plenty of fluids.  Prune juice may be helpful.  You may use a stool softener, such as Colace (over the counter) 100 mg twice a day.  Use MiraLax (over the counter) for constipation as needed.   Diet - low sodium heart healthy   Complete by:  As directed    Discharge  instructions   Complete by:  As directed    Home health PT 3 times a week Ok to come out of sling for adls Ok to shower dressing waterproof Remove dressing 02/09/2017   Increase  activity slowly as tolerated   Complete by:  As directed       Follow-up Information    Gareth Morgan, MD Follow up.   Specialty:  Family Medicine Contact information: [address] [place] [address] [phone]        [facility] Follow up.   Why:  A representative from [facility] will contact you to arrange start date and time for your therapy. Contact information: [address] [place] [address] [phone]            Signed: Burnard Bunting 02/05/2017, 9:18 AM

## 2017-02-12 ENCOUNTER — Encounter (INDEPENDENT_AMBULATORY_CARE_PROVIDER_SITE_OTHER): Payer: Self-pay | Admitting: Orthopedic Surgery

## 2017-02-12 ENCOUNTER — Ambulatory Visit (INDEPENDENT_AMBULATORY_CARE_PROVIDER_SITE_OTHER): Payer: Medicare Other | Admitting: Orthopedic Surgery

## 2017-02-12 ENCOUNTER — Ambulatory Visit (INDEPENDENT_AMBULATORY_CARE_PROVIDER_SITE_OTHER): Payer: Medicare Other

## 2017-02-12 DIAGNOSIS — Z96612 Presence of left artificial shoulder joint: Secondary | ICD-10-CM | POA: Diagnosis not present

## 2017-02-12 NOTE — Progress Notes (Signed)
Office Visit Note   Patient: [name]           Date of Birth: 04/24/1940           MRN: [id number] Visit Date: 02/12/2017 Requested by: Gareth MorganKnowlton, Steve, MD [address] [place], [place] [address] PCP: Gareth MorganKnowlton, Steve, MD  Subjective: Chief Complaint  Patient presents with  . Left Shoulder - Routine Post Op    HPI: See below              ROS: See below  Assessment & Plan: Visit Diagnoses:  1. Status post total replacement of left shoulder     Plan: An as is 76 year old patient now 2 weeks out left shoulder replacement.  She has a CPM type brace but she hasn't been able to use it.  She's been doing her own exercises.  Incision looks intact.  She is trying to work on blood glucose management.  Plan at this time is to continue with shoulder range of motion exercises and home health physical therapy.  Continue using the shoulder brace for mobilization at least 3 hours a day for the next 2-3 weeks.  Okay to discontinue the sling.  4 week return for clinical recheck  Follow-Up Instructions: No Follow-up on file.   Orders:  Orders Placed This Encounter  Procedures  . XR Shoulder Left   No orders of the defined types were placed in this encounter.     Procedures: No procedures performed   Clinical Data: No additional findings.  Objective: Vital Signs: There were no vitals taken for this visit.  Physical Exam: See above  Ortho Exam: See above  Specialty Comments:  No specialty comments available.  Imaging: No results found.   PMFS History: Patient Active Problem List   Diagnosis Date Noted  . AKI (acute kidney injury) (HCC) 02/01/2017  . Shoulder arthritis 01/30/2017  . Chronic knee pain (Bilateral) 08/09/2015  . Chronic pain 08/09/2015  . Spinal cord stimulator status 08/09/2015  . Primary osteoarthritis of knee (Bilateral) 02/23/2014  . Osteoarthritis of knee (Bilateral) 08/12/2013  . PVC's (premature ventricular contractions) 07/20/2010  .  ANXIETY 06/18/2007  . DIABETES MELLITUS, TYPE II 05/13/2007  . OBESITY, MORBID 05/13/2007  . DEPRESSION 05/13/2007  . Essential hypertension, benign 05/13/2007  . ASTHMA 05/13/2007  . GERD 05/13/2007  . IBS 05/13/2007  . CKD (chronic kidney disease) stage 3, GFR 30-59 ml/min (HCC) 05/13/2007  . ARTHRITIS 05/13/2007  . SLEEP APNEA 05/13/2007   Past Medical History:  Diagnosis Date  . Anxiety   . Arthritis   . Asthma   . Carotid artery aneurysm [facility](HCC)    Right s/p endovascular treatment 2004  . Carpal tunnel syndrome   . Cataract   . CKD (chronic kidney disease) stage 3, GFR 30-59 ml/min (HCC)   . Depression   . Essential hypertension, benign   . GERD (gastroesophageal reflux disease)   . Glaucoma   . History of recurrent UTIs   . Hx of colonic polyps   . Hx of migraines   . Hypothyroidism   . IBS (irritable bowel syndrome)   . Low back pain   . Morbid obesity (HCC)   . Neurogenic bladder   . Osteoporosis   . Pneumonia   . Sleep apnea    Intolerant to CPAP   . Stroke (HCC)   . Type 2 diabetes mellitus with diabetic neuropathy (HCC)     Family History  Problem Relation Age of Onset  . Heart  disease Mother   . Cancer Mother   . Diabetes Brother   . Cancer Brother        Prostate   . Cancer Brother        Pancreatic   . Cancer Sister        Breast   . Heart attack Sister   . Lung disease Unknown   . Asthma Unknown   . Colon cancer Neg Hx   . Colon polyps Neg Hx     Past Surgical History:  Procedure Laterality Date  . ABDOMINAL HYSTERECTOMY    . CARDIAC CATHETERIZATION     1980 or 1990  . Carpal tunnel release - bilateral  1992  . CATARACT EXTRACTION W/ INTRAOCULAR LENS IMPLANT Bilateral   . CEREBRAL ANEURYSM REPAIR  2004   Coil   . COLONOSCOPY  06/14/2006   ZOX:[id number]:SIMPLE ADENOMAS(2), pTICS, [employer] lipoma  . ESOPHAGOGASTRODUODENOSCOPY  08/12/2007   WUJ:[id number]:Normal esophagus without evidence of Barrett's,mass, erosion ulceration or stricture.  The GE junction was at  40 cm.  The Bravo  capsule was placed at 34 cm/ Unable to appreciate a hiatal hernia in the retroflexed viewNormal duodenal bulb and second portion of the duodenum. mild gastritis.   Marland Kitchen. EYE SURGERY    . SHOULDER SURGERY     Rotator cuff repair, 2012  . SPINAL CORD STIMULATOR INSERTION    . Stomach surgery gastropexy for gastric volvulus  2009  . Total hip replacement - right  2002  . TUBAL LIGATION    . Tubular Adenoma    . VESICOVAGINAL FISTULA CLOSURE W/ TAH     Social History   Occupational History  . 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 Smoking status: Former Smoker    Types: Cigarettes  . Smokeless tobacco: Never Used  Substance and Sexual Activity  . Alcohol use: No  . Drug use: No  . Sexual activity: Not on file

## 2017-02-14 ENCOUNTER — Encounter (HOSPITAL_COMMUNITY): Payer: Self-pay | Admitting: Emergency Medicine

## 2017-02-14 ENCOUNTER — Emergency Department (HOSPITAL_COMMUNITY)
Admission: EM | Admit: 2017-02-14 | Discharge: 2017-02-14 | Disposition: A | Discharge status: Home / Self Care | Payer: Medicare Other | Attending: Emergency Medicine | Admitting: Emergency Medicine

## 2017-02-14 DIAGNOSIS — N183 Chronic kidney disease, stage 3 (moderate): Secondary | ICD-10-CM | POA: Insufficient documentation

## 2017-02-14 DIAGNOSIS — Z7984 Long term (current) use of oral hypoglycemic drugs: Secondary | ICD-10-CM | POA: Diagnosis not present

## 2017-02-14 DIAGNOSIS — I129 Hypertensive chronic kidney disease with stage 1 through stage 4 chronic kidney disease, or unspecified chronic kidney disease: Secondary | ICD-10-CM | POA: Diagnosis not present

## 2017-02-14 DIAGNOSIS — Z87891 Personal history of nicotine dependence: Secondary | ICD-10-CM | POA: Diagnosis not present

## 2017-02-14 DIAGNOSIS — E039 Hypothyroidism, unspecified: Secondary | ICD-10-CM | POA: Insufficient documentation

## 2017-02-14 DIAGNOSIS — Z96641 Presence of right artificial hip joint: Secondary | ICD-10-CM | POA: Diagnosis not present

## 2017-02-14 DIAGNOSIS — Z79899 Other long term (current) drug therapy: Secondary | ICD-10-CM | POA: Diagnosis not present

## 2017-02-14 DIAGNOSIS — N390 Urinary tract infection, site not specified: Secondary | ICD-10-CM | POA: Diagnosis not present

## 2017-02-14 DIAGNOSIS — E1140 Type 2 diabetes mellitus with diabetic neuropathy, unspecified: Secondary | ICD-10-CM | POA: Insufficient documentation

## 2017-02-14 DIAGNOSIS — R739 Hyperglycemia, unspecified: Secondary | ICD-10-CM

## 2017-02-14 DIAGNOSIS — J45909 Unspecified asthma, uncomplicated: Secondary | ICD-10-CM | POA: Insufficient documentation

## 2017-02-14 DIAGNOSIS — E1122 Type 2 diabetes mellitus with diabetic chronic kidney disease: Secondary | ICD-10-CM | POA: Insufficient documentation

## 2017-02-14 DIAGNOSIS — R1010 Upper abdominal pain, unspecified: Secondary | ICD-10-CM | POA: Diagnosis present

## 2017-02-14 LAB — COMPREHENSIVE METABOLIC PANEL
ALBUMIN: 3.8 g/dL (ref 3.5–5.0)
ALT: 23 U/L (ref 14–54)
ANION GAP: 8 (ref 5–15)
AST: 26 U/L (ref 15–41)
Alkaline Phosphatase: 128 U/L — ABNORMAL HIGH (ref 38–126)
BUN: 19 mg/dL (ref 6–20)
CO2: 25 mmol/L (ref 22–32)
Calcium: 9.2 mg/dL (ref 8.9–10.3)
Chloride: 100 mmol/L — ABNORMAL LOW (ref 101–111)
Creatinine, Ser: 1.53 mg/dL — ABNORMAL HIGH (ref 0.44–1.00)
GFR calc Af Amer: 36 mL/min — ABNORMAL LOW (ref >60)
GFR calc non Af Amer: 31 mL/min — ABNORMAL LOW (ref >60)
GLUCOSE: 190 mg/dL — AB (ref 65–99)
POTASSIUM: 4.2 mmol/L (ref 3.5–5.1)
SODIUM: 133 mmol/L — AB (ref 135–145)
Total Bilirubin: 0.6 mg/dL (ref 0.3–1.2)
Total Protein: 7.9 g/dL (ref 6.5–8.1)

## 2017-02-14 LAB — BLOOD GAS, VENOUS
ACID-BASE EXCESS: 1.2 mmol/L (ref 0.0–2.0)
Bicarbonate: 25 mmol/L (ref 20.0–28.0)
FIO2: 0.21
O2 CONTENT: 21 L/min
O2 SAT: 88 %
PO2 VEN: 58.6 mmHg — AB (ref 32.0–45.0)
pCO2, Ven: 46.5 mmHg (ref 44.0–60.0)
pH, Ven: 7.366 (ref 7.250–7.430)

## 2017-02-14 LAB — URINALYSIS, ROUTINE W REFLEX MICROSCOPIC
BILIRUBIN URINE: NEGATIVE
Glucose, UA: 50 mg/dL — AB
Hgb urine dipstick: NEGATIVE
Ketones, ur: NEGATIVE mg/dL
Nitrite: POSITIVE — AB
Protein, ur: NEGATIVE mg/dL
SPECIFIC GRAVITY, URINE: 1.01 (ref 1.005–1.030)
pH: 5 (ref 5.0–8.0)

## 2017-02-14 LAB — CBC WITH DIFFERENTIAL/PLATELET
Basophils Absolute: 0 10*3/uL (ref 0.0–0.1)
Basophils Relative: 0 %
Eosinophils Absolute: 0.1 10*3/uL (ref 0.0–0.7)
Eosinophils Relative: 1 %
HEMATOCRIT: 33.5 % — AB (ref 36.0–46.0)
Hemoglobin: 10.4 g/dL — ABNORMAL LOW (ref 12.0–15.0)
LYMPHS ABS: 1.7 10*3/uL (ref 0.7–4.0)
LYMPHS PCT: 24 %
MCH: 28.3 pg (ref 26.0–34.0)
MCHC: 31 g/dL (ref 30.0–36.0)
MCV: 91.3 fL (ref 78.0–100.0)
MONO ABS: 0.3 10*3/uL (ref 0.1–1.0)
MONOS PCT: 4 %
NEUTROS ABS: 5.1 10*3/uL (ref 1.7–7.7)
Neutrophils Relative %: 71 %
Platelets: 342 10*3/uL (ref 150–400)
RBC: 3.67 MIL/uL — ABNORMAL LOW (ref 3.87–5.11)
RDW: 16.2 % — AB (ref 11.5–15.5)
WBC: 7.2 10*3/uL (ref 4.0–10.5)

## 2017-02-14 LAB — CBG MONITORING, ED: Glucose-Capillary: 219 mg/dL — ABNORMAL HIGH (ref 65–99)

## 2017-02-14 MED ORDER — CEPHALEXIN 500 MG PO CAPS: 500.0000 mg | ORAL_CAPSULE | Freq: Two times a day (BID) | ORAL | 0 refills | Status: AC

## 2017-02-14 MED ORDER — CEPHALEXIN 500 MG PO CAPS
500.0000 mg | ORAL_CAPSULE | Freq: Once | ORAL | Status: AC
  Administered 2017-02-14: 500 mg via ORAL
  Filled 2017-02-14: qty 1

## 2017-02-14 MED ORDER — DEXTROSE 5 % IV SOLN
1.0000 g | Freq: Once | INTRAVENOUS | Status: DC
  Filled 2017-02-14: qty 10

## 2017-02-14 NOTE — ED Provider Notes (Signed)
[facility] EMERGENCY DEPARTMENT Provider Note   CSN: [id number] Arrival date & time: 02/14/17  1548     History   Chief Complaint Chief Complaint  Patient presents with  . Hyperglycemia    HPI [name] is a 76 y.o. female.  HPI  Presents with concern of hyperglycemia and abdominal pain. She notes that since the recent left shoulder replacement she has been doing generally well, but over the past 2 or 3 days has noticed elevated glucose levels. Today she also noticed mild upper abdominal discomfort, and with this combination of concerns she presents for evaluation. She states that she takes all medication as directed, regularly, has had no recent medication changes, diet changes. She is unsure whether she received any steroids during her procedure. She denies any lightheadedness, confusion, nausea, vomiting, urinary complaints. She does acknowledge recent urinary tract infection.   Past Medical History:  Diagnosis Date  . Anxiety   . Arthritis   . Asthma   . Carotid artery aneurysm [facility](HCC)    Right s/p endovascular treatment 2004  . Carpal tunnel syndrome   . Cataract   . CKD (chronic kidney disease) stage 3, GFR 30-59 ml/min (HCC)   . Depression   . Essential hypertension, benign   . GERD (gastroesophageal reflux disease)   . Glaucoma   . History of recurrent UTIs   . Hx of colonic polyps   . Hx of migraines   . Hypothyroidism   . IBS (irritable bowel syndrome)   . Low back pain   . Morbid obesity (HCC)   . Neurogenic bladder   . Osteoporosis   . Pneumonia   . Sleep apnea    Intolerant to CPAP   . Stroke (HCC)   . Type 2 diabetes mellitus with diabetic neuropathy [facility](HCC)     Patient Active Problem List   Diagnosis Date Noted  . AKI (acute kidney injury) (HCC) 02/01/2017  . Shoulder arthritis 01/30/2017  . Chronic knee pain (Bilateral) 08/09/2015  . Chronic pain 08/09/2015  . Spinal cord stimulator status 08/09/2015  . Primary osteoarthritis of  knee (Bilateral) 02/23/2014  . Osteoarthritis of knee (Bilateral) 08/12/2013  . PVC's (premature ventricular contractions) 07/20/2010  . ANXIETY 06/18/2007  . DIABETES MELLITUS, TYPE II 05/13/2007  . OBESITY, MORBID 05/13/2007  . DEPRESSION 05/13/2007  . Essential hypertension, benign 05/13/2007  . ASTHMA 05/13/2007  . GERD 05/13/2007  . IBS 05/13/2007  . CKD (chronic kidney disease) stage 3, GFR 30-59 ml/min (HCC) 05/13/2007  . ARTHRITIS 05/13/2007  . SLEEP APNEA 05/13/2007    Past Surgical History:  Procedure Laterality Date  . ABDOMINAL HYSTERECTOMY    . CARDIAC CATHETERIZATION     1980 or 1990  . Carpal tunnel release - bilateral  1992  . CATARACT EXTRACTION W/ INTRAOCULAR LENS IMPLANT Bilateral   . CEREBRAL ANEURYSM REPAIR  2004   Coil   . COLONOSCOPY  06/14/2006   WUJ:WJXBJYSLF:SIMPLE ADENOMAS(2), pTICS, [place] lipoma  . ESOPHAGOGASTRODUODENOSCOPY  08/12/2007   NWG:NFAOZHSLF:Normal esophagus without evidence of Barrett's,mass, erosion ulceration or stricture.  The GE junction was at 40 cm.  The Bravo  capsule was placed at 34 cm/ Unable to appreciate a hiatal hernia in the retroflexed viewNormal duodenal bulb and second portion of the duodenum. mild gastritis.   Marland Kitchen. EYE SURGERY    . SHOULDER SURGERY     Rotator cuff repair, 2012  . SPINAL CORD STIMULATOR INSERTION    . Stomach surgery gastropexy for gastric volvulus  2009  .  Total hip replacement - right  2002  . TUBAL LIGATION    . Tubular Adenoma    . VESICOVAGINAL FISTULA CLOSURE W/ TAH      OB History    Gravida Para Term Preterm AB Living             6   SAB TAB Ectopic Multiple Live Births                   Home Medications    Prior to Admission medications   Medication Sig Start Date End Date Taking? Authorizing Provider  albuterol (PROVENTIL) (2.5 MG/3ML) 0.083% nebulizer solution Take 3 mLs (2.5 mg total) by nebulization every 6 (six) hours as needed for wheezing or shortness of breath. 06/26/15   Rolland Porter, MD    benzonatate (TESSALON) 100 MG capsule Take 1 capsule (100 mg total) by mouth every 8 (eight) hours. Patient taking differently: Take 100 mg by mouth daily as needed for cough.  06/26/15   Rolland Porter, MD  cephALEXin [facility]) 500 MG capsule Take 1 capsule (500 mg total) 2 (two) times daily for 5 days by mouth. 02/14/17 02/19/17  Gerhard Munch, MD  Cholecalciferol (VITAMIN D3) 5000 units CAPS Take 5,000 Units by mouth daily.    [provider]  colchicine 0.6 MG tablet Take 1 tablet (0.6 mg total) by mouth every 6 (six) hours. Patient taking differently: Take 0.6 mg by mouth daily as needed (for gout).  10/13/15   Vickki Hearing, MD  Cyanocobalamin (VITAMIN B 12 PO) Take 1 tablet by mouth daily.    [provider]  dicyclomine (BENTYL) 10 MG capsule Take 1 capsule 2-3 times daily 30 minutes before a meal for abdominal cramping and loose stools. Patient taking differently: Take 10 mg by mouth 3 (three) times daily as needed for spasms.  09/04/16   Tiffany Kocher, PA-C  doxepin (SINEQUAN) 25 MG capsule Take 75 mg by mouth at bedtime.     [provider]  empagliflozin (JARDIANCE) 25 MG TABS tablet Take 25 mg by mouth daily.    [provider]  Fluticasone-Salmeterol (ADVAIR) 250-50 MCG/DOSE AEPB Inhale 1 puff into the lungs 2 (two) times daily as needed (for shortness of breath).    [provider]  furosemide (LASIX) 20 MG tablet Take 20 mg by mouth daily.     [provider]  guaiFENesin (MUCINEX) 600 MG 12 hr tablet Take 600 mg by mouth 2 (two) times daily as needed for cough or to loosen phlegm.    [provider]  HYDROmorphone (DILAUDID) 2 MG tablet Take 1 tablet (2 mg total) by mouth every 4 (four) hours as needed for severe pain. 02/01/17   Cammy Copa, MD  levothyroxine (SYNTHROID, LEVOTHROID) 75 MCG tablet Take 75 mcg by mouth daily. 06/11/15   [provider]  losartan (COZAAR) 25 MG tablet Take 25 mg by mouth  daily.  04/29/13   [provider]  meloxicam (MOBIC) 7.5 MG tablet Take 1 tablet (7.5 mg total) by mouth daily. 12/20/16   Vickki Hearing, MD  omeprazole (PRILOSEC) 20 MG capsule Take 20 mg by mouth 2 (two) times daily.     [provider]  phenazopyridine (PYRIDIUM) 200 MG tablet Take 200 mg by mouth 3 (three) times daily as needed for pain.    [provider]  PROAIR HFA 108 [phone] Base) MCG/ACT inhaler Inhale 2 puffs into the lungs every 6 (six) hours as needed for  wheezing.  07/02/15   [provider]    Family History Family History  Problem Relation Age of Onset  . Heart disease Mother   . Cancer Mother   . Diabetes Brother   . Cancer Brother        Prostate   . Cancer Brother        Pancreatic   . Cancer Sister        Breast   . Heart attack Sister   . Lung disease Unknown   . Asthma Unknown   . Colon cancer Neg Hx   . Colon polyps Neg Hx     Social History Social History   Tobacco Use  . Smoking status: Former Smoker    Types: Cigarettes  . Smokeless tobacco: Never Used  Substance Use Topics  . Alcohol use: No  . Drug use: No     Allergies   Oxycodone; Codeine; Hydrocodone; and Naproxen   Review of Systems Review of Systems  Constitutional:       Per HPI, otherwise negative  HENT:       Per HPI, otherwise negative  Respiratory:       Per HPI, otherwise negative  Cardiovascular:       Per HPI, otherwise negative  Gastrointestinal: Negative for vomiting.  Endocrine:       Negative aside from HPI  Genitourinary:       Neg aside from HPI   Musculoskeletal:       Per HPI, otherwise negative  Skin: Negative.   Neurological: Negative for syncope.     Physical Exam Updated Vital Signs BP (!) 153/69   Pulse 71   Temp 98.1 F (36.7 C) (Oral)   Resp 17   Ht 5' (1.524 m)   Wt 95.3 kg (210 lb)   SpO2 97%   BMI 41.01 kg/m   Physical Exam  Constitutional: She is oriented to person, place, and time. She appears  well-developed and well-nourished. No distress.  HENT:  Head: Normocephalic and atraumatic.  Eyes: Conjunctivae and EOM are normal.  Cardiovascular: Normal rate and regular rhythm.  Pulmonary/Chest: Effort normal and breath sounds normal. No stridor. No respiratory distress.  Abdominal: She exhibits no distension.  Musculoskeletal: She exhibits no edema.  Neurological: She is alert and oriented to person, place, and time. No cranial nerve deficit.  Skin: Skin is warm and dry.  Psychiatric: She has a normal mood and affect.  Nursing note and vitals reviewed.    ED Treatments / Results  Labs (all labs ordered are listed, but only abnormal results are displayed) Labs Reviewed  COMPREHENSIVE METABOLIC PANEL - Abnormal; Notable for the following components:      Result Value   Sodium 133 (*)    Chloride 100 (*)    Glucose, Bld 190 (*)    Creatinine, Ser 1.53 (*)    Alkaline Phosphatase 128 (*)    GFR calc non Af Amer 31 (*)    GFR calc Af Amer 36 (*)    All other components within normal limits  CBC WITH DIFFERENTIAL/PLATELET - Abnormal; Notable for the following components:   RBC 3.67 (*)    Hemoglobin 10.4 (*)    HCT 33.5 (*)    RDW 16.2 (*)    All other components within normal limits  URINALYSIS, ROUTINE W REFLEX MICROSCOPIC - Abnormal; Notable for the following components:   Color, Urine AMBER (*)    APPearance TURBID (*)    Glucose, UA 50 (*)  Nitrite POSITIVE (*)    Leukocytes, UA LARGE (*)    Bacteria, UA RARE (*)    Squamous Epithelial / LPF 6-30 (*)    All other components within normal limits  BLOOD GAS, VENOUS - Abnormal; Notable for the following components:   pO2, Ven 58.6 (*)    All other components within normal limits  CBG MONITORING, ED - Abnormal; Notable for the following components:   Glucose-Capillary 219 (*)    All other components within normal limits   Procedures Procedures (including critical care time)  Medications Ordered in  ED Medications  cefTRIAXone (ROCEPHIN) 1 g in dextrose 5 % 50 mL IVPB (not administered)  cephALEXin (KEFLEX) capsule 500 mg (not administered)     Initial Impression / Assessment and Plan / ED Course  I have reviewed the triage vital signs and the nursing notes.  Pertinent labs & imaging results that were available during my care of the patient were reviewed by me and considered in my medical decision making (see chart for details).  7:26 PM Patient in no distress, awake, alert, smiling. We discussed the findings at length including reassuring metabolic panel, suspicion for urinary tract infection, and absence of evidence for DKA or other acute new pathology. Patient will begin oral therapy for urinary tract infection, and given the otherwise reassuring findings, follow-up closely with her for primary care physician.  Final Clinical Impressions(s) / ED Diagnoses   Final diagnoses:  Lower urinary tract infectious disease  Hyperglycemia    ED Discharge Orders        Ordered    cephALEXin (KEFLEX) 500 MG capsule  2 times daily     02/14/17 1925       Gerhard MunchLockwood, Loralie Malta, MD 02/14/17 1927

## 2017-02-14 NOTE — Discharge Instructions (Signed)
As discussed, your evaluation today has been largely reassuring.  But, it is important that you monitor your condition carefully, and do not hesitate to return to the ED if you develop new, or concerning changes in your condition. ? ?Otherwise, please follow-up with your physician for appropriate ongoing care. ? ?

## 2017-02-14 NOTE — ED Triage Notes (Signed)
PT states blood sugar reading in the 400 s today with nausea after eating cereal. PT states she had shoulder surgery x2 weeks ago and felt like her blood sugar has been elevated since surgery.

## 2017-02-27 ENCOUNTER — Ambulatory Visit: Payer: Medicare Other | Admitting: "Endocrinology

## 2017-02-27 ENCOUNTER — Encounter: Payer: Self-pay | Admitting: "Endocrinology

## 2017-02-27 VITALS — BP 121/77 | HR 76 | Ht 60.0 in | Wt 205.0 lb

## 2017-02-27 DIAGNOSIS — E1122 Type 2 diabetes mellitus with diabetic chronic kidney disease: Secondary | ICD-10-CM | POA: Diagnosis not present

## 2017-02-27 DIAGNOSIS — N184 Chronic kidney disease, stage 4 (severe): Secondary | ICD-10-CM | POA: Diagnosis not present

## 2017-02-27 DIAGNOSIS — I10 Essential (primary) hypertension: Secondary | ICD-10-CM

## 2017-02-27 DIAGNOSIS — E039 Hypothyroidism, unspecified: Secondary | ICD-10-CM | POA: Diagnosis not present

## 2017-02-27 MED ORDER — LINAGLIPTIN 5 MG PO TABS: 5.0000 mg | ORAL_TABLET | Freq: Every day | ORAL | 2 refills | Status: DC

## 2017-02-27 NOTE — Patient Instructions (Signed)

## 2017-02-27 NOTE — Progress Notes (Signed)
Consult Note       02/27/2017, 12:11 PM   Subjective:    Patient ID: Shelby Leveringnez H Rousseau, female    DOB: 05/25/1940.  [name] is being seen in consultation for management of currently uncontrolled symptomatic diabetes requested by  Gareth MorganKnowlton, Steve, MD.   Past Medical History:  Diagnosis Date  . Anxiety   . Arthritis   . Asthma   . Carotid artery aneurysm [facility](HCC)    Right s/p endovascular treatment 2004  . Carpal tunnel syndrome   . Cataract   . CKD (chronic kidney disease) stage 3, GFR 30-59 ml/min (HCC)   . Depression   . Essential hypertension, benign   . GERD (gastroesophageal reflux disease)   . Glaucoma   . History of recurrent UTIs   . Hx of colonic polyps   . Hx of migraines   . Hypothyroidism   . IBS (irritable bowel syndrome)   . Low back pain   . Morbid obesity (HCC)   . Neurogenic bladder   . Osteoporosis   . Pneumonia   . Sleep apnea    Intolerant to CPAP   . Stroke (HCC)   . Type 2 diabetes mellitus with diabetic neuropathy [facility](HCC)    Past Surgical History:  Procedure Laterality Date  . ABDOMINAL HYSTERECTOMY    . CARDIAC CATHETERIZATION     1980 or 1990  . Carpal tunnel release - bilateral  1992  . CATARACT EXTRACTION W/ INTRAOCULAR LENS IMPLANT Bilateral   . CEREBRAL ANEURYSM REPAIR  2004   Coil   . COLONOSCOPY N/A 03/04/2013   Dr. Darrick PennaFields: moderate diverticulosis, moderate internal hemorrhoids. Next colonoscopy in 5-10 years with history of simple adenomas.  . COLONOSCOPY  06/14/2006   MVH:[id number]:SIMPLE ADENOMAS(2), pTICS, [place] lipoma  . ESOPHAGOGASTRODUODENOSCOPY  08/12/2007   XBM:[id number]:Normal esophagus without evidence of Barrett's,mass, erosion ulceration or stricture.  The GE junction was at 40 cm.  The Bravo  capsule was placed at 34 cm/ Unable to appreciate a hiatal hernia in the retroflexed viewNormal duodenal bulb and second portion of the duodenum. mild gastritis.   Marland Kitchen. EYE  SURGERY    . SHOULDER SURGERY     Rotator cuff repair, 2012  . SPINAL CORD STIMULATOR INSERTION    . Stomach surgery gastropexy for gastric volvulus  2009  . Total hip replacement - right  2002  . TOTAL SHOULDER ARTHROPLASTY Left 01/30/2017   Procedure: LEFT TOTAL SHOULDER ARTHROPLASTY;  Surgeon: Cammy Copaean, Gregory Scott, MD;  [facility];  Service: Orthopedics;  Laterality: Left;  . TUBAL LIGATION    . Tubular Adenoma    . VESICOVAGINAL FISTULA CLOSURE W/ TAH     Social History   Socioeconomic History  . Marital status: Legally Separated    Spouse name: None  . Number of children: None  . Years of education: 12th grade  . Highest education level: None  Social Needs  . Financial resource strain: None  . Food insecurity - worry: None  . Food insecurity - inability: None  . Transportation needs - medical: None  . Transportation needs - non-medical: None  Occupational History  . 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 Smoking status: Former Smoker    Types: Cigarettes  . Smokeless tobacco: Never Used  Substance and Sexual Activity  . Alcohol use: No  . Drug use: No  . Sexual activity: None  Other Topics Concern  . None  Social History Narrative  . None   Outpatient Encounter Medications as of 02/27/2017  Medication Sig  . [DISCONTINUED] empagliflozin (JARDIANCE) 25 MG TABS tablet Take 25 mg by mouth daily.  . [DISCONTINUED] glimepiride (AMARYL) 2 MG tablet Take 2 mg by mouth daily with breakfast.  . [DISCONTINUED] glimepiride (AMARYL) 4 MG tablet Take by mouth daily.  . [DISCONTINUED] metFORMIN (GLUCOPHAGE) 500 MG tablet Take by mouth 2 (two) times daily.  Marland Kitchen. albuterol (PROVENTIL) (2.5 MG/3ML) 0.083% nebulizer solution Take 3 mLs (2.5 mg total) by nebulization every 6 (six) hours as needed for wheezing or shortness of breath.  . benzonatate (TESSALON) 100 MG capsule Take 1 capsule (100 mg total) by mouth every 8 (eight) hours.  (Patient taking differently: Take 100 mg by mouth daily as needed for cough. )  . Cholecalciferol (VITAMIN D3) 5000 units CAPS Take 5,000 Units by mouth daily.  . colchicine 0.6 MG tablet Take 1 tablet (0.6 mg total) by mouth every 6 (six) hours. (Patient taking differently: Take 0.6 mg by mouth daily as needed (for gout). )  . Cyanocobalamin (VITAMIN B 12 PO) Take 1 tablet by mouth daily.  Marland Kitchen. dicyclomine (BENTYL) 10 MG capsule Take 1 capsule 2-3 times daily 30 minutes before a meal for abdominal cramping and loose stools. (Patient taking differently: Take 10 mg by mouth 3 (three) times daily as needed for spasms. )  . doxepin (SINEQUAN) 25 MG capsule Take 75 mg by mouth at bedtime.   . Fluticasone-Salmeterol (ADVAIR) 250-50 MCG/DOSE AEPB Inhale 1 puff into the lungs 2 (two) times daily as needed (for shortness of breath).  . furosemide (LASIX) 20 MG tablet Take 20 mg by mouth daily.   Marland Kitchen. guaiFENesin (MUCINEX) 600 MG 12 hr tablet Take 600 mg by mouth 2 (two) times daily as needed for cough or to loosen phlegm.  Marland Kitchen. HYDROmorphone (DILAUDID) 2 MG tablet Take 1 tablet (2 mg total) by mouth every 4 (four) hours as needed for severe pain. (Patient not taking: Reported on 02/27/2017)  . levothyroxine (SYNTHROID, LEVOTHROID) 75 MCG tablet Take 75 mcg by mouth daily.  Marland Kitchen. linagliptin (TRADJENTA) 5 MG TABS tablet Take 1 tablet (5 mg total) by mouth daily.  Marland Kitchen. losartan (COZAAR) 25 MG tablet Take 25 mg by mouth daily.   . meloxicam (MOBIC) 7.5 MG tablet Take 1 tablet (7.5 mg total) by mouth daily. (Patient not taking: Reported on 02/27/2017)  . omeprazole (PRILOSEC) 20 MG capsule Take 20 mg by mouth 2 (two) times daily.   . phenazopyridine (PYRIDIUM) 200 MG tablet Take 200 mg by mouth 3 (three) times daily as needed for pain.  Marland Kitchen. PROAIR HFA 108 (90 Base) MCG/ACT inhaler Inhale 2 puffs into the lungs every 6 (six) hours as needed for wheezing.   . [DISCONTINUED] empagliflozin (JARDIANCE) 25 MG TABS tablet Take 25 mg by  mouth daily.   Facility-Administered Encounter Medications as of 02/27/2017  Medication  . vancomycin (VANCOCIN) IVPB 1000 mg/200 mL premix    ALLERGIES: Allergies  Allergen Reactions  . Oxycodone Anaphylaxis and Itching  . Codeine Rash  . Hydrocodone Itching and Nausea And Vomiting  . Naproxen Nausea And  Vomiting and Other (See Comments)    Hallucinations    VACCINATION STATUS: Immunization History  Administered Date(s) Administered  . Influenza Whole 12/25/2007  . Pneumococcal Polysaccharide-23 12/25/2007    Diabetes  She presents for her initial diabetic visit. She has type 2 diabetes mellitus. Onset time: She was diagnosed at approximate age of 31 years.. Her disease course has been worsening. There are no hypoglycemic associated symptoms. Pertinent negatives for hypoglycemia include no confusion, headaches, pallor or seizures. Associated symptoms include fatigue, polydipsia and polyuria. Pertinent negatives for diabetes include no chest pain and no polyphagia. There are no hypoglycemic complications. Symptoms are worsening. Diabetic complications include nephropathy. Risk factors for coronary artery disease include diabetes mellitus, dyslipidemia, family history, hypertension, obesity, sedentary lifestyle and tobacco exposure. Her weight is decreasing steadily. She is following a generally unhealthy diet. When asked about meal planning, she reported none. She has not had a previous visit with a dietitian. She never participates in exercise. Her overall blood glucose range is >200 mg/dl. (She brought a meter showing 90 readings in the last 7 days averaging 294. Her most recent A1c was 8.1% from 01/30/2017.) An ACE inhibitor/angiotensin II receptor blocker is being taken. Eye exam is current.  Hyperlipidemia  This is a chronic problem. The current episode started more than 1 year ago. The problem is uncontrolled. Recent lipid tests were reviewed and are high. Exacerbating diseases  include diabetes, hypothyroidism and obesity. Pertinent negatives include no chest pain, myalgias or shortness of breath. She is currently on no antihyperlipidemic treatment. Risk factors for coronary artery disease include diabetes mellitus, dyslipidemia, hypertension, obesity and a sedentary lifestyle.  Hypertension  This is a chronic problem. The current episode started more than 1 year ago. The problem is controlled. Pertinent negatives include no chest pain, headaches, palpitations or shortness of breath. Risk factors for coronary artery disease include diabetes mellitus, dyslipidemia, obesity, sedentary lifestyle and smoking/tobacco exposure. Past treatments include angiotensin blockers and diuretics.      Review of Systems  Constitutional: Positive for fatigue. Negative for chills, fever and unexpected weight change.  HENT: Negative for trouble swallowing and voice change.   Eyes: Negative for visual disturbance.  Respiratory: Negative for cough, shortness of breath and wheezing.   Cardiovascular: Negative for chest pain, palpitations and leg swelling.  Gastrointestinal: Negative for diarrhea, nausea and vomiting.  Endocrine: Positive for polydipsia and polyuria. Negative for cold intolerance, heat intolerance and polyphagia.  Musculoskeletal: Positive for gait problem. Negative for arthralgias and myalgias.  Skin: Negative for color change, pallor, rash and wound.  Neurological: Negative for seizures and headaches.  Psychiatric/Behavioral: Negative for confusion and suicidal ideas.    Objective:    BP 121/77   Pulse 76   Ht 5' (1.524 m)   Wt 205 lb (93 kg)   BMI 40.04 kg/m   Wt Readings from Last 3 Encounters:  02/27/17 205 lb (93 kg)  02/14/17 210 lb (95.3 kg)  01/30/17 210 lb (95.3 kg)     Physical Exam  Constitutional: She is oriented to person, place, and time. She appears well-developed.  Uses a cane to walk due to recent hip replacement and diffuse joint  arthritis.   HENT:  Head: Normocephalic and atraumatic.  Eyes: EOM are normal.  Neck: Normal range of motion. Neck supple. No tracheal deviation present. No thyromegaly present.  Cardiovascular: Normal rate and regular rhythm.  Pulmonary/Chest: Effort normal and breath sounds normal.  Abdominal: Soft. Bowel sounds are normal. There is no tenderness. There  is no guarding.  Musculoskeletal: She exhibits no edema.  Neurological: She is alert and oriented to person, place, and time. She has normal reflexes. No cranial nerve deficit. Coordination normal.  Skin: Skin is warm and dry. No rash noted. No erythema. No pallor.  Psychiatric: She has a normal mood and affect. Judgment normal.      CMP ( most recent) CMP     Component Value Date/Time   NA 133 (L) 02/14/2017 1647   NA 142 07/10/2012 1135   K 4.2 02/14/2017 1647   K 4.4 07/10/2012 1135   CL 100 (L) 02/14/2017 1647   CL 111 (H) 07/10/2012 1135   CO2 25 02/14/2017 1647   CO2 25 07/10/2012 1135   GLUCOSE 190 (H) 02/14/2017 1647   GLUCOSE 148 (H) 07/10/2012 1135   BUN 19 02/14/2017 1647   BUN 22 (H) 07/10/2012 1135   CREATININE 1.53 (H) 02/14/2017 1647   CREATININE 1.37 (H) 07/10/2012 1135   CALCIUM 9.2 02/14/2017 1647   CALCIUM 8.8 07/10/2012 1135   PROT 7.9 02/14/2017 1647   ALBUMIN 3.8 02/14/2017 1647   AST 26 02/14/2017 1647   ALT 23 02/14/2017 1647   ALKPHOS 128 (H) 02/14/2017 1647   BILITOT 0.6 02/14/2017 1647   GFRNONAA 31 (L) 02/14/2017 1647   GFRNONAA 38 (L) 07/10/2012 1135   GFRAA 36 (L) 02/14/2017 1647   GFRAA 44 (L) 07/10/2012 1135     Diabetic Labs (most recent): Lab Results  Component Value Date   HGBA1C 8.1 (H) 01/30/2017   HGBA1C 8.2 (H) 10/10/2016   HGBA1C 5.9 06/23/2008     Lipid Panel ( most recent) Lipid Panel     Component Value Date/Time   CHOL 183 06/23/2008 2159   TRIG 110 06/23/2008 2159   HDL 58 06/23/2008 2159   CHOLHDL 3.2 Ratio 06/23/2008 2159   VLDL 22 06/23/2008 2159    LDLCALC 103 (H) 06/23/2008 2159      Lab Results  Component Value Date   TSH 2.886 11/20/2007   TSH 1.589 11/06/2007      Assessment & Plan:   1. Type 2 diabetes mellitus with stage 4 chronic kidney disease, without long-term current use of insulin (HCC)  - REMMINGTON TETERS has currently uncontrolled symptomatic type 2 DM since  77 years of age,  with most recent A1c of 8.1 %. Recent labs reviewed.  -her diabetes is complicated by stage 4 renal insufficiency, obesity/sedentary life, history of smoking and RAMIYAH MCCLENAHAN remains at a high risk for more acute and chronic complications which include CAD, CVA, CKD, retinopathy, and neuropathy. These are all discussed in detail with the patient.  - I have counseled her on diet management and weight loss, by adopting a carbohydrate restricted/protein rich diet.  - Suggestion is made for her to avoid simple carbohydrates  from her diet including Cakes, Sweet Desserts, Ice Cream, Soda (diet and regular), Sweet Tea, Candies, Chips, Cookies, Store Bought Juices, Alcohol in Excess of  1-2 drinks a day, Artificial Sweeteners, and "Sugar-free" Products. This will help patient to have stable blood glucose profile and potentially avoid unintended weight gain.  - I encouraged her to switch to  unprocessed or minimally processed complex starch and increased protein intake (animal or plant source), fruits, and vegetables.  - she is advised to stick to a routine mealtimes to eat 3 meals  a day and avoid unnecessary snacks ( to snack only to correct hypoglycemia).   - she will be scheduled with  Norm Salt, RDN, CDE for individualized diabetes education.  - I have approached her with the following individualized plan to manage diabetes and patient agrees:   -  She may require at least basal insulin to treat diabetes to target. - However, to assure her commitment for proper monitoring I advised her to  initiate strict monitoring of glucose 4 times a  day-before meals and at bedtime and return in one week with her meter and logs.   -Patient is encouraged to call clinic for blood glucose levels less than 70 or above 300 mg /dl.  - I will discontinue metformin, Jardiance, risk outweighs benefit for this patient, and patient has advanced renal insufficiency. - She may benefit from evaluation by nephrologist. - I discussed and initiated Tradjenta 5 mg by mouth daily.   - Patient specific target  A1c;  LDL, HDL, Triglycerides, and  Waist Circumference were discussed in detail.  2) BP/HTN: controlled . Continue current medications including ACEI/ARB. 3) Lipids/HPL:   Uncontrolled. patient's medication list does not include statin. Patient is advised to bring all her medications next visit.  4)  Weight/Diet: CDE Consult will be initiated , exercise, and detailed carbohydrates information provided.  5) Hypothyroidism - Etiology unclear. Patient denies any prior history of thyroid surgery or ablation. -No recent full profile thyroid function test. I advised her to stay on levothyroxine 75 g by mouth every morning.  - We discussed about correct intake of levothyroxine, at fasting, with water, separated by at least 30 minutes from breakfast, and separated by more than 4 hours from calcium, iron, multivitamins, acid reflux medications (PPIs). -Patient is made aware of the fact that thyroid hormone replacement is needed for life, dose to be adjusted by periodic monitoring of thyroid function tests.  6) Chronic Care/Health Maintenance:  -she  is on ARB   is encouraged to continue to follow up with Ophthalmology, Dentist,  Podiatrist at least yearly or according to recommendations, and advised to   stay away from smoking. I have recommended yearly flu vaccine and pneumonia vaccination at least every 5 years; moderate intensity exercise for up to 150 minutes weekly; and  sleep for at least 7 hours a day.  - I advised patient to maintain close follow up  with Gareth Morgan, MD for primary care needs.  - Time spent with the patient: 1 hour, of which >50% was spent in obtaining information about her symptoms, reviewing her previous labs, evaluations, and treatments, counseling her about her   currently uncontrolled, complicated type 2 diabetes, hyperlipidemia, hypertension, and hypothyroidism, and developing a plan for long term treatment; her  questions were answered to her satisfaction.  Follow up plan: - Return in about 1 week (around 03/06/2017) for follow up with meter and logs- no labs.  Marquis Lunch, MD [facility] [address] [place], [place] [address] Phone: [phone]  Fax: [phone]    02/27/2017, 12:11 PM  This note was partially dictated with voice recognition software. Similar sounding words can be transcribed inadequately or may not  be corrected upon review.

## 2017-03-05 ENCOUNTER — Encounter (INDEPENDENT_AMBULATORY_CARE_PROVIDER_SITE_OTHER): Payer: Self-pay | Admitting: Orthopedic Surgery

## 2017-03-05 ENCOUNTER — Ambulatory Visit (INDEPENDENT_AMBULATORY_CARE_PROVIDER_SITE_OTHER): Payer: Medicare Other | Admitting: Orthopedic Surgery

## 2017-03-05 DIAGNOSIS — Z96612 Presence of left artificial shoulder joint: Secondary | ICD-10-CM

## 2017-03-05 DIAGNOSIS — M25552 Pain in left hip: Secondary | ICD-10-CM

## 2017-03-05 MED ORDER — HYDROMORPHONE HCL 2 MG PO TABS: ORAL_TABLET | ORAL | 0 refills | Status: DC

## 2017-03-06 ENCOUNTER — Ambulatory Visit (INDEPENDENT_AMBULATORY_CARE_PROVIDER_SITE_OTHER): Payer: Medicare Other | Admitting: "Endocrinology

## 2017-03-06 ENCOUNTER — Encounter: Payer: Self-pay | Admitting: "Endocrinology

## 2017-03-06 VITALS — BP 152/78 | HR 80 | Ht 60.0 in | Wt 206.0 lb

## 2017-03-06 DIAGNOSIS — I10 Essential (primary) hypertension: Secondary | ICD-10-CM

## 2017-03-06 DIAGNOSIS — N184 Chronic kidney disease, stage 4 (severe): Secondary | ICD-10-CM | POA: Diagnosis not present

## 2017-03-06 DIAGNOSIS — E1122 Type 2 diabetes mellitus with diabetic chronic kidney disease: Secondary | ICD-10-CM | POA: Diagnosis not present

## 2017-03-06 DIAGNOSIS — E039 Hypothyroidism, unspecified: Secondary | ICD-10-CM | POA: Diagnosis not present

## 2017-03-06 MED ORDER — GLIPIZIDE 5 MG PO TABS: 5.0000 mg | ORAL_TABLET | Freq: Every day | ORAL | 3 refills | Status: DC

## 2017-03-06 NOTE — Patient Instructions (Signed)

## 2017-03-06 NOTE — Progress Notes (Signed)
Consult Note       03/06/2017, 1:29 PM   Subjective:    Patient ID: [name], female    DOB: 1940/07/13.  [name] is being seen in consultation for management of currently uncontrolled symptomatic diabetes requested by  Shelby Morgan, MD.   Past Medical History:  Diagnosis Date  . Anxiety   . Arthritis   . Asthma   . Carotid artery aneurysm [facility])    Right s/p endovascular treatment 2004  . Carpal tunnel syndrome   . Cataract   . CKD (chronic kidney disease) stage 3, GFR 30-59 ml/min (HCC)   . Depression   . Essential hypertension, benign   . GERD (gastroesophageal reflux disease)   . Glaucoma   . History of recurrent UTIs   . Hx of colonic polyps   . Hx of migraines   . Hypothyroidism   . IBS (irritable bowel syndrome)   . Low back pain   . Morbid obesity (HCC)   . Neurogenic bladder   . Osteoporosis   . Pneumonia   . Sleep apnea    Intolerant to CPAP   . Stroke (HCC)   . Type 2 diabetes mellitus with diabetic neuropathy [facility])    Past Surgical History:  Procedure Laterality Date  . ABDOMINAL HYSTERECTOMY    . CARDIAC CATHETERIZATION     1980 or 1990  . Carpal tunnel release - bilateral  1992  . CATARACT EXTRACTION W/ INTRAOCULAR LENS IMPLANT Bilateral   . CEREBRAL ANEURYSM REPAIR  2004   Coil   . COLONOSCOPY N/A 03/04/2013   Dr. Darrick [name]: moderate diverticulosis, moderate internal hemorrhoids. Next colonoscopy in 5-10 years with history of simple adenomas.  . COLONOSCOPY  06/14/2006   ZOX:WRUEAV ADENOMAS(2), pTICS, [place] lipoma  . ESOPHAGOGASTRODUODENOSCOPY  08/12/2007   WUJ:WJXBJY esophagus without evidence of Barrett's,mass, erosion ulceration or stricture.  The GE junction was at 40 cm.  The Bravo  capsule was placed at 34 cm/ Unable to appreciate a hiatal hernia in the retroflexed viewNormal duodenal bulb and second portion of the duodenum. mild gastritis.   Marland Kitchen EYE  SURGERY    . SHOULDER SURGERY     Rotator cuff repair, 2012  . SPINAL CORD STIMULATOR INSERTION    . Stomach surgery gastropexy for gastric volvulus  2009  . Total hip replacement - right  2002  . TOTAL SHOULDER ARTHROPLASTY Left 01/30/2017   Procedure: LEFT TOTAL SHOULDER ARTHROPLASTY;  Surgeon: Shelby Copa, MD;  Location: [facility] OR;  Service: Orthopedics;  Laterality: Left;  . TUBAL LIGATION    . Tubular Adenoma    . VESICOVAGINAL FISTULA CLOSURE W/ TAH     Social History   Socioeconomic History  . Marital status: Legally Separated    Spouse name: None  . Number of children: None  . Years of education: 12th grade  . Highest education level: None  Social Needs  . Financial resource strain: None  . Food insecurity - worry: None  . Food insecurity - inability: None  . Transportation needs - medical: None  . Transportation needs - non-medical: None  Occupational History  . 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 Smoking status: Former Smoker    Types: Cigarettes  . Smokeless tobacco: Never Used  Substance and Sexual Activity  . Alcohol use: No  . Drug use: No  . Sexual activity: None  Other Topics Concern  . None  Social History Narrative  . None   Outpatient Encounter Medications as of 03/06/2017  Medication Sig  . albuterol (PROVENTIL) (2.5 MG/3ML) 0.083% nebulizer solution Take 3 mLs (2.5 mg total) by nebulization every 6 (six) hours as needed for wheezing or shortness of breath.  . benzonatate (TESSALON) 100 MG capsule Take 1 capsule (100 mg total) by mouth every 8 (eight) hours. (Patient taking differently: Take 100 mg by mouth daily as needed for cough. )  . Cholecalciferol (VITAMIN D3) 5000 units CAPS Take 5,000 Units by mouth daily.  . colchicine 0.6 MG tablet Take 1 tablet (0.6 mg total) by mouth every 6 (six) hours. (Patient taking differently: Take 0.6 mg by mouth daily as needed (for gout). )  . Cyanocobalamin  (VITAMIN B 12 PO) Take 1 tablet by mouth daily.  Marland Kitchen. dicyclomine (BENTYL) 10 MG capsule Take 1 capsule 2-3 times daily 30 minutes before a meal for abdominal cramping and loose stools. (Patient taking differently: Take 10 mg by mouth 3 (three) times daily as needed for spasms. )  . doxepin (SINEQUAN) 25 MG capsule Take 75 mg by mouth at bedtime.   . Fluticasone-Salmeterol (ADVAIR) 250-50 MCG/DOSE AEPB Inhale 1 puff into the lungs 2 (two) times daily as needed (for shortness of breath).  . furosemide (LASIX) 20 MG tablet Take 20 mg by mouth daily.   Marland Kitchen. glipiZIDE (GLUCOTROL) 5 MG tablet Take 1 tablet (5 mg total) by mouth daily before breakfast.  . guaiFENesin (MUCINEX) 600 MG 12 hr tablet Take 600 mg by mouth 2 (two) times daily as needed for cough or to loosen phlegm.  Marland Kitchen. HYDROmorphone (DILAUDID) 2 MG tablet 1 po q d prn  . levothyroxine (SYNTHROID, LEVOTHROID) 75 MCG tablet Take 75 mcg by mouth daily.  Marland Kitchen. linagliptin (TRADJENTA) 5 MG TABS tablet Take 1 tablet (5 mg total) by mouth daily.  Marland Kitchen. losartan (COZAAR) 25 MG tablet Take 25 mg by mouth daily.   . meloxicam (MOBIC) 7.5 MG tablet Take 1 tablet (7.5 mg total) by mouth daily. (Patient not taking: Reported on 02/27/2017)  . omeprazole (PRILOSEC) 20 MG capsule Take 20 mg by mouth 2 (two) times daily.   . phenazopyridine (PYRIDIUM) 200 MG tablet Take 200 mg by mouth 3 (three) times daily as needed for pain.  Marland Kitchen. PROAIR HFA 108 (90 Base) MCG/ACT inhaler Inhale 2 puffs into the lungs every 6 (six) hours as needed for wheezing.    Facility-Administered Encounter Medications as of 03/06/2017  Medication  . vancomycin (VANCOCIN) IVPB 1000 mg/200 mL premix    ALLERGIES: Allergies  Allergen Reactions  . Oxycodone Anaphylaxis and Itching  . Codeine Rash  . Hydrocodone Itching and Nausea And Vomiting  . Naproxen Nausea And Vomiting and Other (See Comments)    Hallucinations    VACCINATION STATUS: Immunization History  Administered Date(s) Administered   . Influenza Whole 12/25/2007  . Pneumococcal Polysaccharide-23 12/25/2007    Diabetes  She presents for her follow-up diabetic visit. She has type 2 diabetes mellitus. Onset time: She was diagnosed at approximate age of 89 years.. Her disease course has been fluctuating. There are no hypoglycemic associated symptoms. Pertinent negatives  for hypoglycemia include no confusion, headaches, pallor or seizures. Associated symptoms include fatigue, polydipsia and polyuria. Pertinent negatives for diabetes include no chest pain and no polyphagia. There are no hypoglycemic complications. Symptoms are improving. Diabetic complications include nephropathy. Risk factors for coronary artery disease include diabetes mellitus, dyslipidemia, family history, hypertension, obesity, sedentary lifestyle and tobacco exposure. Her weight is stable. She is following a generally unhealthy diet. When asked about meal planning, she reported none. She has not had a previous visit with a dietitian. She never participates in exercise. Her breakfast blood glucose range is generally 180-200 mg/dl. Her lunch blood glucose range is generally 180-200 mg/dl. Her dinner blood glucose range is generally 180-200 mg/dl. Her bedtime blood glucose range is generally 180-200 mg/dl. Her overall blood glucose range is 180-200 mg/dl. (She came with still above target blood glucose profile however much better than last visit. Her most recent A1c is 8.1%.) An ACE inhibitor/angiotensin II receptor blocker is being taken. Eye exam is current.  Hyperlipidemia  This is a chronic problem. The current episode started more than 1 year ago. The problem is uncontrolled. Recent lipid tests were reviewed and are high. Exacerbating diseases include diabetes, hypothyroidism and obesity. Pertinent negatives include no chest pain, myalgias or shortness of breath. She is currently on no antihyperlipidemic treatment. Risk factors for coronary artery disease include  diabetes mellitus, dyslipidemia, hypertension, obesity and a sedentary lifestyle.  Hypertension  This is a chronic problem. The current episode started more than 1 year ago. The problem is controlled. Pertinent negatives include no chest pain, headaches, palpitations or shortness of breath. Risk factors for coronary artery disease include diabetes mellitus, dyslipidemia, obesity, sedentary lifestyle and smoking/tobacco exposure. Past treatments include angiotensin blockers and diuretics.    Review of Systems  Constitutional: Positive for fatigue. Negative for chills, fever and unexpected weight change.  HENT: Negative for trouble swallowing and voice change.   Eyes: Negative for visual disturbance.  Respiratory: Negative for cough, shortness of breath and wheezing.   Cardiovascular: Negative for chest pain, palpitations and leg swelling.  Gastrointestinal: Negative for diarrhea, nausea and vomiting.  Endocrine: Positive for polydipsia and polyuria. Negative for cold intolerance, heat intolerance and polyphagia.  Musculoskeletal: Positive for gait problem. Negative for arthralgias and myalgias.  Skin: Negative for color change, pallor, rash and wound.  Neurological: Negative for seizures and headaches.  Psychiatric/Behavioral: Negative for confusion and suicidal ideas.    Objective:    BP (!) 152/78   Pulse 80   Ht 5' (1.524 m)   Wt 206 lb (93.4 kg)   BMI 40.23 kg/m   Wt Readings from Last 3 Encounters:  03/06/17 206 lb (93.4 kg)  02/27/17 205 lb (93 kg)  02/14/17 210 lb (95.3 kg)     Physical Exam  Constitutional: She is oriented to person, place, and time. She appears well-developed.  Uses a cane to walk due to recent hip replacement and diffuse joint arthritis.   HENT:  Head: Normocephalic and atraumatic.  Eyes: EOM are normal.  Neck: Normal range of motion. Neck supple. No tracheal deviation present. No thyromegaly present.  Cardiovascular: Normal rate and regular rhythm.   Pulmonary/Chest: Effort normal and breath sounds normal.  Abdominal: Soft. Bowel sounds are normal. There is no tenderness. There is no guarding.  Musculoskeletal: She exhibits no edema.  Neurological: She is alert and oriented to person, place, and time. She has normal reflexes. No cranial nerve deficit. Coordination normal.  Skin: Skin is warm and dry. No rash  noted. No erythema. No pallor.  Psychiatric: She has a normal mood and affect. Judgment normal.      CMP ( most recent) CMP     Component Value Date/Time   NA 133 (L) 02/14/2017 1647   NA 142 07/10/2012 1135   K 4.2 02/14/2017 1647   K 4.4 07/10/2012 1135   CL 100 (L) 02/14/2017 1647   CL 111 (H) 07/10/2012 1135   CO2 25 02/14/2017 1647   CO2 25 07/10/2012 1135   GLUCOSE 190 (H) 02/14/2017 1647   GLUCOSE 148 (H) 07/10/2012 1135   BUN 19 02/14/2017 1647   BUN 22 (H) 07/10/2012 1135   CREATININE 1.53 (H) 02/14/2017 1647   CREATININE 1.37 (H) 07/10/2012 1135   CALCIUM 9.2 02/14/2017 1647   CALCIUM 8.8 07/10/2012 1135   PROT 7.9 02/14/2017 1647   ALBUMIN 3.8 02/14/2017 1647   AST 26 02/14/2017 1647   ALT 23 02/14/2017 1647   ALKPHOS 128 (H) 02/14/2017 1647   BILITOT 0.6 02/14/2017 1647   GFRNONAA 31 (L) 02/14/2017 1647   GFRNONAA 38 (L) 07/10/2012 1135   GFRAA 36 (L) 02/14/2017 1647   GFRAA 44 (L) 07/10/2012 1135     Diabetic Labs (most recent): Lab Results  Component Value Date   HGBA1C 8.1 (H) 01/30/2017   HGBA1C 8.2 (H) 10/10/2016   HGBA1C 5.9 06/23/2008     Lipid Panel ( most recent) Lipid Panel     Component Value Date/Time   CHOL 183 06/23/2008 2159   TRIG 110 06/23/2008 2159   HDL 58 06/23/2008 2159   CHOLHDL 3.2 Ratio 06/23/2008 2159   VLDL 22 06/23/2008 2159   LDLCALC 103 (H) 06/23/2008 2159      Lab Results  Component Value Date   TSH 2.886 11/20/2007   TSH 1.589 11/06/2007      Assessment & Plan:   1. Type 2 diabetes mellitus with stage 4 chronic kidney disease, without  long-term current use of insulin (HCC)  - Jeb Leveringnez H Bornemann has currently uncontrolled symptomatic type 2 DM since  76 years of age,  with most recent A1c of 8.1 %. Recent labs reviewed.  -her diabetes is complicated by stage 4 renal insufficiency, obesity/sedentary life, history of smoking and Jeb Leveringnez H Swearingin remains at a high risk for more acute and chronic complications which include CAD, CVA, CKD, retinopathy, and neuropathy. These are all discussed in detail with the patient.  - I have counseled her on diet management and weight loss, by adopting a carbohydrate restricted/protein rich diet.  -  Suggestion is made for her to avoid simple carbohydrates  from her diet including Cakes, Sweet Desserts / Pastries, Ice Cream, Soda (diet and regular), Sweet Tea, Candies, Chips, Cookies, Store Bought Juices, Alcohol in Excess of  1-2 drinks a day, Artificial Sweeteners, and "Sugar-free" Products. This will help patient to have stable blood glucose profile and potentially avoid unintended weight gain.   - I encouraged her to switch to  unprocessed or minimally processed complex starch and increased protein intake (animal or plant source), fruits, and vegetables.  - she is advised to stick to a routine mealtimes to eat 3 meals  a day and avoid unnecessary snacks ( to snack only to correct hypoglycemia).   - she will be scheduled with Norm SaltPenny Crumpton, RDN, CDE for individualized diabetes education- consult pending.  - I have approached her with the following individualized plan to manage diabetes and patient agrees:   -  She may require at least  basal insulin to treat diabetes to target. - However, she appears to be overwhelmed with simple instructions and decided not to start insulin treatment on her for now.  - I discussed and added glipizide 5 mg by mouth daily with breakfast along with Tradjenta 5 mg by mouth daily.  - She is approached and agrees to continue to monitor blood glucose 2 times a  day-before breakfast and at bedtime.   -Patient is encouraged to call clinic for blood glucose levels less than 70 or above 300 mg /dl.  - I have discontinued metformin, Jardiance, risk outweighs benefit for this patient, and patient has advanced renal insufficiency. - She may benefit from evaluation by nephrologist.  - Patient specific target  A1c;  LDL, HDL, Triglycerides, and  Waist Circumference were discussed in detail.  2) BP/HTN: Uncontrolled . I advised patient to continue  current medications including ACEI/ARB. 3) Lipids/HPL:   Uncontrolled. patient's medication list does not include statin. Patient is advised to bring all her medications next visit.  4)  Weight/Diet: CDE Consult will be initiated , exercise, and detailed carbohydrates information provided.  5) Hypothyroidism - Etiology unclear. Patient denies any prior history of thyroid surgery or ablation. -No recent full profile thyroid function test. I advised her to stay on levothyroxine 75 g by mouth every morning.  - We discussed about correct intake of levothyroxine, at fasting, with water, separated by at least 30 minutes from breakfast, and separated by more than 4 hours from calcium, iron, multivitamins, acid reflux medications (PPIs). -Patient is made aware of the fact that thyroid hormone replacement is needed for life, dose to be adjusted by periodic monitoring of thyroid function tests.  6) Chronic Care/Health Maintenance:  -she  is on ARB   is encouraged to continue to follow up with Ophthalmology, Dentist,  Podiatrist at least yearly or according to recommendations, and advised to   stay away from smoking. I have recommended yearly flu vaccine and pneumonia vaccination at least every 5 years; moderate intensity exercise for up to 150 minutes weekly; and  sleep for at least 7 hours a day.  - I advised patient to maintain close follow up with Shelby Morgan, MD for primary care needs.  - Time spent with the  patient: 25 min, of which >50% was spent in reviewing her sugar logs , discussing her hypo- and hyper-glycemic episodes, reviewing her current and  previous labs and insulin doses and developing a plan to avoid hypo- and hyper-glycemia.   Follow up plan: - Return in about 10 weeks (around 05/15/2017).  Marquis Lunch, MD [facility] [address] [place], [place] [address] Phone: [phone]  Fax: [phone]    03/06/2017, 1:29 PM  This note was partially dictated with voice recognition software. Similar sounding words can be transcribed inadequately or may not  be corrected upon review.

## 2017-03-09 ENCOUNTER — Telehealth (INDEPENDENT_AMBULATORY_CARE_PROVIDER_SITE_OTHER): Payer: Self-pay | Admitting: Orthopedic Surgery

## 2017-03-09 NOTE — Telephone Encounter (Signed)
[name] left a voicemail stating that she was returning a call to the office.  I called to ask what it was regarding and she stated that Dr. August Saucerean ordered an MRI to evaluate her hamstring. Maybe calling to schedule that for her?

## 2017-03-09 NOTE — Telephone Encounter (Signed)
MRI order has been entered. IC patient and advised it was pending auth from insurance and could get it scheduled once approved.

## 2017-03-10 ENCOUNTER — Encounter (INDEPENDENT_AMBULATORY_CARE_PROVIDER_SITE_OTHER): Payer: Self-pay | Admitting: Orthopedic Surgery

## 2017-03-10 NOTE — Progress Notes (Signed)
Post-Op Visit Note   Patient: [name]           Date of Birth: 01/03/1941           MRN: [id number] Visit Date: 03/05/2017 PCP: Gareth MorganKnowlton, Steve, MD   Assessment & Plan:  Chief Complaint:  Chief Complaint  Patient presents with  . Left Shoulder - Follow-up   Visit Diagnoses:  1. Pain in left hip   2. Status post total replacement of left shoulder     Plan: [name] is a patient who is now 2 months out left total shoulder replacement.  States her arm feels good.  She was recently diagnosed with stage IV kidney disease.  She will not have to go on dialysis yet.  She wants to do her own physical therapy.  She also reports left-sided hamstring weakness and ischial tuberosity pain following injury several weeks ago.  On exam she does have a little hamstring weakness on the left as well as ischial tuberosity tenderness.  Her left shoulder moves well she is getting close to 90 degrees of forward flexion and abduction.  No real pain with range of motion.  Plan at this time is MRI of the pelvis to evaluate that left hamstring proximal tear.  She may have tendinitis versus a tear.  I will see her back after that study.  Follow-Up Instructions: Return for after MRI.   Orders:  Orders Placed This Encounter  Procedures  . MR Pelvis w/o contrast   Meds ordered this encounter  Medications  . HYDROmorphone (DILAUDID) 2 MG tablet    Sig: 1 po q d prn    Dispense:  25 tablet    Refill:  0    Imaging: No results found.  PMFS History: Patient Active Problem List   Diagnosis Date Noted  . Hypothyroidism 02/27/2017  . AKI (acute kidney injury) (HCC) 02/01/2017  . Shoulder arthritis 01/30/2017  . Chronic knee pain (Bilateral) 08/09/2015  . Chronic pain 08/09/2015  . Spinal cord stimulator status 08/09/2015  . Primary osteoarthritis of knee (Bilateral) 02/23/2014  . Osteoarthritis of knee (Bilateral) 08/12/2013  . PVC's (premature ventricular contractions) 07/20/2010  . ANXIETY  06/18/2007  . Type 2 diabetes mellitus with stage 4 chronic kidney disease, without long-term current use of insulin (HCC) 05/13/2007  . OBESITY, MORBID 05/13/2007  . DEPRESSION 05/13/2007  . Essential hypertension, benign 05/13/2007  . ASTHMA 05/13/2007  . GERD 05/13/2007  . IBS 05/13/2007  . CKD (chronic kidney disease) stage 3, GFR 30-59 ml/min (HCC) 05/13/2007  . ARTHRITIS 05/13/2007  . SLEEP APNEA 05/13/2007   Past Medical History:  Diagnosis Date  . Anxiety   . Arthritis   . Asthma   . Carotid artery aneurysm [facility](HCC)    Right s/p endovascular treatment 2004  . Carpal tunnel syndrome   . Cataract   . CKD (chronic kidney disease) stage 3, GFR 30-59 ml/min (HCC)   . Depression   . Essential hypertension, benign   . GERD (gastroesophageal reflux disease)   . Glaucoma   . History of recurrent UTIs   . Hx of colonic polyps   . Hx of migraines   . Hypothyroidism   . IBS (irritable bowel syndrome)   . Low back pain   . Morbid obesity (HCC)   . Neurogenic bladder   . Osteoporosis   . Pneumonia   . Sleep apnea    Intolerant to CPAP   . Stroke (HCC)   . Type 2 diabetes mellitus  with diabetic neuropathy (HCC)     Family History  Problem Relation Age of Onset  . Heart disease Mother   . Cancer Mother   . Diabetes Brother   . Cancer Brother        Prostate   . Cancer Brother        Pancreatic   . Cancer Sister        Breast   . Heart attack Sister   . Lung disease Unknown   . Asthma Unknown   . Colon cancer Neg Hx   . Colon polyps Neg Hx     Past Surgical History:  Procedure Laterality Date  . ABDOMINAL HYSTERECTOMY    . CARDIAC CATHETERIZATION     1980 or 1990  . Carpal tunnel release - bilateral  1992  . CATARACT EXTRACTION W/ INTRAOCULAR LENS IMPLANT Bilateral   . CEREBRAL ANEURYSM REPAIR  2004   Coil   . COLONOSCOPY N/A 03/04/2013   Dr. Darrick PennaFields: moderate diverticulosis, moderate internal hemorrhoids. Next colonoscopy in 5-10 years with history of simple  adenomas.  . COLONOSCOPY  06/14/2006   WUJ:[id number]:SIMPLE ADENOMAS(2), pTICS, [place] lipoma  . ESOPHAGOGASTRODUODENOSCOPY  08/12/2007   NWG:NFAOZHSLF:Normal esophagus without evidence of Barrett's,mass, erosion ulceration or stricture.  The GE junction was at 40 cm.  The Bravo  capsule was placed at 34 cm/ Unable to appreciate a hiatal hernia in the retroflexed viewNormal duodenal bulb and second portion of the duodenum. mild gastritis.   Marland Kitchen. EYE SURGERY    . SHOULDER SURGERY     Rotator cuff repair, 2012  . SPINAL CORD STIMULATOR INSERTION    . Stomach surgery gastropexy for gastric volvulus  2009  . Total hip replacement - right  2002  . TOTAL SHOULDER ARTHROPLASTY Left 01/30/2017   Procedure: LEFT TOTAL SHOULDER ARTHROPLASTY;  Surgeon: Cammy Copaean, Gregory Scott, MD;  [facility];  Service: Orthopedics;  Laterality: Left;  . TUBAL LIGATION    . Tubular Adenoma    . VESICOVAGINAL FISTULA CLOSURE W/ TAH     Social History   Occupational History  . 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 Smoking status: Former Smoker    Types: Cigarettes  . Smokeless tobacco: Never Used  Substance and Sexual Activity  . Alcohol use: No  . Drug use: No  . Sexual activity: Not on file

## 2017-03-15 NOTE — Telephone Encounter (Signed)
I sw pt and [facility] to [facility] since 03/06/17 and then again today 12/13 to schedule appt, pt states she never received call and they must be calling her other number. I gave pt number to call to schedule appt.

## 2017-04-04 ENCOUNTER — Other Ambulatory Visit (INDEPENDENT_AMBULATORY_CARE_PROVIDER_SITE_OTHER): Payer: Self-pay | Admitting: Orthopedic Surgery

## 2017-04-05 ENCOUNTER — Telehealth (INDEPENDENT_AMBULATORY_CARE_PROVIDER_SITE_OTHER): Payer: Self-pay | Admitting: Orthopedic Surgery

## 2017-04-05 ENCOUNTER — Telehealth (INDEPENDENT_AMBULATORY_CARE_PROVIDER_SITE_OTHER): Payer: Self-pay

## 2017-04-05 NOTE — Telephone Encounter (Signed)
I tried calling pt back, pt picked up stated she could not hear me and asked to call her back, I called back and she still could not hear me. I will try at a later time. But [facility] has been trying to contact pt to schedule appt, pt can call [facility] and schedule appt.

## 2017-04-05 NOTE — Telephone Encounter (Signed)
Can you please advise if ok to rf? 

## 2017-04-05 NOTE — Telephone Encounter (Signed)
error 

## 2017-04-05 NOTE — Telephone Encounter (Signed)
Noted, Thanks

## 2017-04-05 NOTE — Telephone Encounter (Signed)
Rx ready for pick up at the front desk. Called patient, she is aware.

## 2017-04-05 NOTE — Telephone Encounter (Signed)
Erin ok'd this rx.  Can you please find it and put up front for her? I am in [place] office today.

## 2017-04-05 NOTE — Telephone Encounter (Signed)
Patient would like to know when her MRI will be ? I Tried to look but did not see anything.

## 2017-04-05 NOTE — Telephone Encounter (Signed)
Patient walked in and I advised her of your message and gave her the # to [facility].

## 2017-04-09 ENCOUNTER — Encounter: Payer: Self-pay | Admitting: Nutrition

## 2017-04-09 ENCOUNTER — Telehealth (INDEPENDENT_AMBULATORY_CARE_PROVIDER_SITE_OTHER): Payer: Self-pay | Admitting: *Deleted

## 2017-04-09 ENCOUNTER — Encounter: Payer: Medicare Other | Attending: "Endocrinology | Admitting: Nutrition

## 2017-04-09 VITALS — Ht 60.0 in | Wt 207.0 lb

## 2017-04-09 DIAGNOSIS — E1122 Type 2 diabetes mellitus with diabetic chronic kidney disease: Secondary | ICD-10-CM | POA: Diagnosis not present

## 2017-04-09 DIAGNOSIS — N184 Chronic kidney disease, stage 4 (severe): Secondary | ICD-10-CM | POA: Diagnosis not present

## 2017-04-09 DIAGNOSIS — Z713 Dietary counseling and surveillance: Secondary | ICD-10-CM | POA: Diagnosis not present

## 2017-04-09 DIAGNOSIS — E669 Obesity, unspecified: Secondary | ICD-10-CM

## 2017-04-09 DIAGNOSIS — E118 Type 2 diabetes mellitus with unspecified complications: Secondary | ICD-10-CM

## 2017-04-09 NOTE — Telephone Encounter (Signed)
[facility] called stating she is needing a clearance letter from Dr. August Saucerean for pt to have MRI, pt is on Blood thinners and since Dr. August Saucerean put her on them needs the letter from him. Please advise.   If you have any questions please call [phone] or fax the letter to [phone]

## 2017-04-09 NOTE — Progress Notes (Signed)
Medical Nutrition Therapy:  Appt start time: 0800 end time:  0900.   Assessment:  Primary concerns today: Diabetes Type 2.  Stg 4 CKD. Sees Dr. Fransico HimNida, Endocrinology. Hasn't seen Nephrologist yet. Goes next week.  LIves with [place] son in [place], [place] and comes to [place]  for her MD appts..   Eats 2-3 meals a day.  Medications: Trajenta 5 mg a day. GLipiizide 5 mg a day. PCP Dr. Sudie BaileyKnowlton.  Has lost 30  Lbs in the last year. Lost weight by getting rid of sodas, teas and sodas and junk food. Had shoulder surgery in October 2018. Testing 1-2 times per day. BS avg 144 mg/dl. Diet is low in vegetables. Drinking water. Eats fruits as snacks between meals and needs to change to just eat fruit with meals and only veggies between meals. Has  pain in her left 'hamstring' that is causing significant pain. Suppose to get an MRI next week.  She is motivated to make changes with her diet.  Limited mobility. Walks with a cane.              Lab Results  Component Value Date   HGBA1C 8.1 (H) 01/30/2017   CMP Latest Ref Rng & Units 02/14/2017 02/01/2017 02/01/2017  Glucose 65 - 99 mg/dL 161(W190(H) 960(A186(H) 540(J111(H)  BUN 6 - 20 mg/dL 19 81(X21(H) 91(Y22(H)  Creatinine 0.44 - 1.00 mg/dL 7.82(N1.53(H) 5.62(Z2.17(H) 3.08(M2.30(H)  Sodium 135 - 145 mmol/L 133(L) 135 136  Potassium 3.5 - 5.1 mmol/L 4.2 4.7 4.7  Chloride 101 - 111 mmol/L 100(L) 103 105  CO2 22 - 32 mmol/L 25 18(L) 18(L)  Calcium 8.9 - 10.3 mg/dL 9.2 5.7(Q8.6(L) 4.6(N8.4(L)  Total Protein 6.5 - 8.1 g/dL 7.9 - -  Total Bilirubin 0.3 - 1.2 mg/dL 0.6 - -  Alkaline Phos 38 - 126 U/L 128(H) - -  AST 15 - 41 U/L 26 - -  ALT 14 - 54 U/L 23 - -     Preferred Learning Style:   No preference indicated   Learning Readiness:  Ready  Change in progress   MEDICATIONS:   DIETARY INTAKE:  24-hr recall:  B ( AM): 2 eggs, half hashbrown,  1 slice toast, water Snk ( AM): fruit  L ( PM): chicken, baked, creamed potatoes,  Slaw, water Snk ( PM): fruit-grapes, pineapple, berries,-1/2  c D ( PM): Pears, Chicken alfredo, water Snk ( PM): LS V 8 juice Beverages:water  Usual physical activity: ADL   Estimated energy needs: 1200  calories 135 g carbohydrates 90 g protein 33 g fat  Progress Towards Goal(s):  In progress.   Nutritional Diagnosis:  NB-1.1 Food and nutrition-related knowledge deficit As related to Type 2 DM  As evidenced by A1C 8.1%.    Intervention:  Nutrition and Diabetes education provided on My Plate, CHO counting, meal planning, portion sizes, timing of meals, avoiding snacks between meals unless having a low blood sugar, target ranges for A1C and blood sugars, signs/symptoms and treatment of hyper/hypoglycemia, monitoring blood sugars, taking medications as prescribed, benefits of exercising 30 minutes per day and prevention of complications of DM.   Goals 1. Follow My Plate 2. Eat 2 carb choices per meal 3. Increase vegetables 1-2 svg with lunch and dinner 4. Keep drinking water 5. Eat fruit with meals instead of snacks. 6. Keep up the good work Test blood sugar twice a day Get up and eat breakfast by 9 am.  Teaching Method Utilized:  Visual Auditory Hands on  Handouts given  during visit include:  The Plate Method  Meal Plan Card    Barriers to learning/adherence to lifestyle change: none  Demonstrated degree of understanding via:  Teach Back   Monitoring/Evaluation:  Dietary intake, exercise, meal planning, SBG, and body weight in 1 month(s).

## 2017-04-09 NOTE — Telephone Encounter (Signed)
Ok for mri °

## 2017-04-09 NOTE — Telephone Encounter (Signed)
Faxed order stating such

## 2017-04-09 NOTE — Patient Instructions (Addendum)
Goals 1. Follow My Plate 2. Eat 2 carb choices per meal 3. Increase vegetables 1-2 svg with lunch and dinner 4. Keep drinking water 5. Eat fruit with meals instead of snacks. 6. Keep up the good work Test blood sugar twice a day Get up and eat breakfast by 9 am.

## 2017-04-09 NOTE — Telephone Encounter (Signed)
Please advise thanks.

## 2017-04-10 ENCOUNTER — Telehealth (INDEPENDENT_AMBULATORY_CARE_PROVIDER_SITE_OTHER): Payer: Self-pay | Admitting: *Deleted

## 2017-04-10 NOTE — Telephone Encounter (Signed)
Pt has appt with [name] for MRI pelvis on Mon Jan 14th at 4p, pt needs to arrive 15 mins early to register. I called and left message on vm for appt information

## 2017-04-13 NOTE — Telephone Encounter (Signed)
Pt aware of appt.

## 2017-04-16 ENCOUNTER — Ambulatory Visit (HOSPITAL_COMMUNITY)
Admission: RE | Admit: 2017-04-16 | Discharge: 2017-04-16 | Disposition: A | Discharge status: Home / Self Care | Payer: Medicare Other | Source: Ambulatory Visit | Attending: Orthopedic Surgery | Admitting: Orthopedic Surgery

## 2017-04-16 DIAGNOSIS — M25552 Pain in left hip: Secondary | ICD-10-CM

## 2017-04-20 ENCOUNTER — Ambulatory Visit (HOSPITAL_COMMUNITY)
Admission: RE | Admit: 2017-04-20 | Discharge: 2017-04-20 | Disposition: A | Discharge status: Home / Self Care | Payer: Medicare Other | Source: Ambulatory Visit | Attending: Orthopedic Surgery | Admitting: Orthopedic Surgery

## 2017-04-20 ENCOUNTER — Other Ambulatory Visit (HOSPITAL_COMMUNITY): Payer: Medicare Other

## 2017-04-20 DIAGNOSIS — M7062 Trochanteric bursitis, left hip: Secondary | ICD-10-CM | POA: Diagnosis not present

## 2017-04-20 DIAGNOSIS — S76012A Strain of muscle, fascia and tendon of left hip, initial encounter: Secondary | ICD-10-CM | POA: Diagnosis not present

## 2017-04-20 DIAGNOSIS — Z96641 Presence of right artificial hip joint: Secondary | ICD-10-CM | POA: Diagnosis not present

## 2017-04-20 DIAGNOSIS — K5730 Diverticulosis of large intestine without perforation or abscess without bleeding: Secondary | ICD-10-CM | POA: Insufficient documentation

## 2017-04-20 DIAGNOSIS — M1611 Unilateral primary osteoarthritis, right hip: Secondary | ICD-10-CM | POA: Insufficient documentation

## 2017-04-20 DIAGNOSIS — M25552 Pain in left hip: Secondary | ICD-10-CM | POA: Diagnosis not present

## 2017-04-25 ENCOUNTER — Ambulatory Visit (INDEPENDENT_AMBULATORY_CARE_PROVIDER_SITE_OTHER): Payer: Medicare Other | Admitting: Orthopedic Surgery

## 2017-04-26 ENCOUNTER — Other Ambulatory Visit: Payer: Self-pay | Admitting: Nephrology

## 2017-04-26 DIAGNOSIS — N183 Chronic kidney disease, stage 3 unspecified: Secondary | ICD-10-CM

## 2017-05-02 ENCOUNTER — Ambulatory Visit (INDEPENDENT_AMBULATORY_CARE_PROVIDER_SITE_OTHER): Payer: Medicare Other | Admitting: Orthopedic Surgery

## 2017-05-02 ENCOUNTER — Encounter (INDEPENDENT_AMBULATORY_CARE_PROVIDER_SITE_OTHER): Payer: Self-pay | Admitting: Orthopedic Surgery

## 2017-05-02 DIAGNOSIS — M79605 Pain in left leg: Secondary | ICD-10-CM

## 2017-05-02 MED ORDER — HYDROMORPHONE HCL 2 MG PO TABS: ORAL_TABLET | ORAL | 0 refills | Status: DC

## 2017-05-02 NOTE — Progress Notes (Signed)
Office Visit Note   Patient: [name]           Date of Birth: [birth year]           MRN: [id number] Visit Date: 05/02/2017 Requested by: Gareth Morgan, MD [address] [place] [address] PCP: Gareth Morgan, MD  Subjective: Chief Complaint  Patient presents with  . Follow-up    review MRI scan of pelvis    HPI: [name] is a patient with left leg pain.  I reviewed her old clinic note and she had injury in May of last year.  In December felt like most of her pain was around the ischial tuberosity.  Since her scan and since that time the pain is migrated distally to the posterior distal aspect of the femur in the hamstring region.  She states that the pain is severe.  She states that it hurts her every day all day.  She states that her spinal cord stimulator is working.  She is not having much in the way of back pain.              ROS: All systems reviewed are negative as they relate to the chief complaint within the history of present illness.  Patient denies  fevers or chills.   Assessment & Plan: Visit Diagnoses:  1. Pain in left leg     Plan: Impression is focal pain at the distal hamstring region just above the knee flexion crease.  This may represent partial muscle tendon junction hamstring tear versus referred pain from the back.  Her pain is severe and debilitating.  Dilaudid 1 time prescription only prescribed ultrasound examination today does not show any obvious disruption of the muscle fiber units.  She needs MRI scan of the femur to rule out structural problem in that posterior leg.  If it is negative and this is coming from her back and we will need to refer her appropriately.  Follow-Up Instructions: Return for after MRI.   Orders:  Orders Placed This Encounter  Procedures  . MR FEMUR LEFT WO CONTRAST   Meds ordered this encounter  Medications  . HYDROmorphone (DILAUDID) 2 MG tablet    Sig: 1 po q 12 hrs prn pain    Dispense:  25 tablet    Refill:   0      Procedures: No procedures performed   Clinical Data: No additional findings.  Objective: Vital Signs: There were no vitals taken for this visit.  Physical Exam:   Constitutional: Patient appears well-developed HEENT:  Head: Normocephalic Eyes:EOM are normal Neck: Normal range of motion Cardiovascular: Normal rate Pulmonary/chest: Effort normal Neurologic: Patient is alert Skin: Skin is warm Psychiatric: Patient has normal mood and affect    Ortho Exam: Orthopedic exam demonstrates no knee effusion and hyperextension of both knees.  Collateral cruciate ligaments and both knees stable.  There is no knee effusion.  Not much in the way of groin pain with internal/external rotation of the leg.  Only mild trochanteric tenderness on the left compared to the right.  MRI scan did show on that left hand side some avulsion of that gluteus medius.  She does have significant pain and tenderness to palpation of that distal hamstring area.  Her hamstring flexion strength however is generally symmetric between the left and right leg.  No definite paresthesias L1-S1 bilaterally.  Specialty Comments:  No specialty comments available.  Imaging: No results found.   PMFS History: Patient Active Problem List  Diagnosis Date Noted  . Hypothyroidism 02/27/2017  . AKI (acute kidney injury) (HCC) 02/01/2017  . Shoulder arthritis 01/30/2017  . Chronic knee pain (Bilateral) 08/09/2015  . Chronic pain 08/09/2015  . Spinal cord stimulator status 08/09/2015  . Primary osteoarthritis of knee (Bilateral) 02/23/2014  . Osteoarthritis of knee (Bilateral) 08/12/2013  . PVC's (premature ventricular contractions) 07/20/2010  . ANXIETY 06/18/2007  . Type 2 diabetes mellitus with stage 4 chronic kidney disease, without long-term current use of insulin (HCC) 05/13/2007  . OBESITY, MORBID 05/13/2007  . DEPRESSION 05/13/2007  . Essential hypertension, benign 05/13/2007  . ASTHMA 05/13/2007  .  GERD 05/13/2007  . IBS 05/13/2007  . CKD (chronic kidney disease) stage 3, GFR 30-59 ml/min (HCC) 05/13/2007  . ARTHRITIS 05/13/2007  . SLEEP APNEA 05/13/2007   Past Medical History:  Diagnosis Date  . Anxiety   . Arthritis   . Asthma   . Carotid artery aneurysm [facility])    Right s/p endovascular treatment 2004  . Carpal tunnel syndrome   . Cataract   . CKD (chronic kidney disease) stage 3, GFR 30-59 ml/min (HCC)   . Depression   . Essential hypertension, benign   . GERD (gastroesophageal reflux disease)   . Glaucoma   . History of recurrent UTIs   . Hx of colonic polyps   . Hx of migraines   . Hypothyroidism   . IBS (irritable bowel syndrome)   . Low back pain   . Morbid obesity (HCC)   . Neurogenic bladder   . Osteoporosis   . Pneumonia   . Sleep apnea    Intolerant to CPAP   . Stroke (HCC)   . Type 2 diabetes mellitus with diabetic neuropathy (HCC)     Family History  Problem Relation Age of Onset  . Heart disease Mother   . Cancer Mother   . Diabetes Brother   . Cancer Brother        Prostate   . Cancer Brother        Pancreatic   . Cancer Sister        Breast   . Heart attack Sister   . Lung disease Unknown   . Asthma Unknown   . Colon cancer Neg Hx   . Colon polyps Neg Hx     Past Surgical History:  Procedure Laterality Date  . ABDOMINAL HYSTERECTOMY    . CARDIAC CATHETERIZATION     1980 or 1990  . Carpal tunnel release - bilateral  1992  . CATARACT EXTRACTION W/ INTRAOCULAR LENS IMPLANT Bilateral   . CEREBRAL ANEURYSM REPAIR  2004   Coil   . COLONOSCOPY N/A 03/04/2013   Dr. Darrick Penna: moderate diverticulosis, moderate internal hemorrhoids. Next colonoscopy in 5-10 years with history of simple adenomas.  . COLONOSCOPY  06/14/2006   WUJ:WJXBJY ADENOMAS(2), pTICS, [place] lipoma  . ESOPHAGOGASTRODUODENOSCOPY  08/12/2007   NWG:NFAOZH esophagus without evidence of Barrett's,mass, erosion ulceration or stricture.  The GE junction was at 40 cm.  The Bravo   capsule was placed at 34 cm/ Unable to appreciate a hiatal hernia in the retroflexed viewNormal duodenal bulb and second portion of the duodenum. mild gastritis.   Marland Kitchen EYE SURGERY    . SHOULDER SURGERY     Rotator cuff repair, 2012  . SPINAL CORD STIMULATOR INSERTION    . Stomach surgery gastropexy for gastric volvulus  2009  . Total hip replacement - right  2002  . TOTAL SHOULDER ARTHROPLASTY Left 01/30/2017   Procedure: LEFT TOTAL  SHOULDER ARTHROPLASTY;  Surgeon: Cammy Copaean, Arnold Kester Scott, MD;  [facility];  Service: Orthopedics;  Laterality: Left;  . TUBAL LIGATION    . Tubular Adenoma    . VESICOVAGINAL FISTULA CLOSURE W/ TAH     Social History   Occupational History  . 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 Smoking status: Former Smoker    Types: Cigarettes  . Smokeless tobacco: Never Used  Substance and Sexual Activity  . Alcohol use: No  . Drug use: No  . Sexual activity: Not on file

## 2017-05-10 LAB — T4, FREE: FREE T4: 1.1 ng/dL (ref 0.8–1.8)

## 2017-05-10 LAB — COMPLETE METABOLIC PANEL WITH GFR
AG Ratio: 1.1 (calc) (ref 1.0–2.5)
ALBUMIN MSPROF: 3.9 g/dL (ref 3.6–5.1)
ALT: 24 U/L (ref 6–29)
AST: 23 U/L (ref 10–35)
Alkaline phosphatase (APISO): 109 U/L (ref 33–130)
BUN / CREAT RATIO: 12 (calc) (ref 6–22)
BUN: 16 mg/dL (ref 7–25)
CO2: 24 mmol/L (ref 20–32)
CREATININE: 1.32 mg/dL — AB (ref 0.60–0.93)
Calcium: 8.8 mg/dL (ref 8.6–10.4)
Chloride: 108 mmol/L (ref 98–110)
GFR, EST NON AFRICAN AMERICAN: 39 mL/min/{1.73_m2} — AB (ref >=60)
GFR, Est African American: 45 mL/min/{1.73_m2} — ABNORMAL LOW (ref >=60)
GLOBULIN: 3.4 g/dL (ref 1.9–3.7)
GLUCOSE: 121 mg/dL — AB (ref 65–99)
Potassium: 4.5 mmol/L (ref 3.5–5.3)
SODIUM: 140 mmol/L (ref 135–146)
Total Bilirubin: 0.4 mg/dL (ref 0.2–1.2)
Total Protein: 7.3 g/dL (ref 6.1–8.1)

## 2017-05-10 LAB — TSH: TSH: 0.31 m[IU]/L — AB (ref 0.40–4.50)

## 2017-05-10 LAB — LIPID PANEL
Cholesterol: 171 mg/dL (ref <200)
HDL: 62 mg/dL (ref >50)
LDL Cholesterol (Calc): 87 mg/dL (calc)
NON-HDL CHOLESTEROL (CALC): 109 mg/dL (ref <130)
TRIGLYCERIDES: 120 mg/dL (ref <150)
Total CHOL/HDL Ratio: 2.8 (calc) (ref <5.0)

## 2017-05-10 LAB — HEMOGLOBIN A1C
HEMOGLOBIN A1C: 6.6 %{Hb} — AB (ref <5.7)
Mean Plasma Glucose: 143 (calc)
eAG (mmol/L): 7.9 (calc)

## 2017-05-10 LAB — VITAMIN D 25 HYDROXY (VIT D DEFICIENCY, FRACTURES): VIT D 25 HYDROXY: 24 ng/mL — AB (ref 30–100)

## 2017-05-14 ENCOUNTER — Other Ambulatory Visit: Payer: Self-pay | Admitting: "Endocrinology

## 2017-05-15 ENCOUNTER — Ambulatory Visit (INDEPENDENT_AMBULATORY_CARE_PROVIDER_SITE_OTHER): Payer: Medicare Other | Admitting: "Endocrinology

## 2017-05-15 ENCOUNTER — Encounter: Payer: Self-pay | Admitting: "Endocrinology

## 2017-05-15 ENCOUNTER — Ambulatory Visit: Payer: Medicare Other | Admitting: Nutrition

## 2017-05-15 VITALS — BP 136/79 | HR 80 | Ht 60.0 in | Wt 208.0 lb

## 2017-05-15 DIAGNOSIS — N184 Chronic kidney disease, stage 4 (severe): Secondary | ICD-10-CM | POA: Diagnosis not present

## 2017-05-15 DIAGNOSIS — I10 Essential (primary) hypertension: Secondary | ICD-10-CM

## 2017-05-15 DIAGNOSIS — E1122 Type 2 diabetes mellitus with diabetic chronic kidney disease: Secondary | ICD-10-CM

## 2017-05-15 DIAGNOSIS — E039 Hypothyroidism, unspecified: Secondary | ICD-10-CM

## 2017-05-15 NOTE — Progress Notes (Signed)
Consult Note       05/15/2017, 12:33 PM   Subjective:    Patient ID: [name], female    DOB: [birth year].  MARAGRET VANACKER is being seen in consultation for management of currently uncontrolled symptomatic diabetes requested by  Gareth Morgan, MD.   Past Medical History:  Diagnosis Date  . Anxiety   . Arthritis   . Asthma   . Carotid artery aneurysm [facility])    Right s/p endovascular treatment 2004  . Carpal tunnel syndrome   . Cataract   . CKD (chronic kidney disease) stage 3, GFR 30-59 ml/min (HCC)   . Depression   . Essential hypertension, benign   . GERD (gastroesophageal reflux disease)   . Glaucoma   . History of recurrent UTIs   . Hx of colonic polyps   . Hx of migraines   . Hypothyroidism   . IBS (irritable bowel syndrome)   . Low back pain   . Morbid obesity (HCC)   . Neurogenic bladder   . Osteoporosis   . Pneumonia   . Sleep apnea    Intolerant to CPAP   . Stroke (HCC)   . Type 2 diabetes mellitus with diabetic neuropathy [facility])    Past Surgical History:  Procedure Laterality Date  . ABDOMINAL HYSTERECTOMY    . CARDIAC CATHETERIZATION     1980 or 1990  . Carpal tunnel release - bilateral  1992  . CATARACT EXTRACTION W/ INTRAOCULAR LENS IMPLANT Bilateral   . CEREBRAL ANEURYSM REPAIR  2004   Coil   . COLONOSCOPY N/A 03/04/2013   Dr. Darrick Penna: moderate diverticulosis, moderate internal hemorrhoids. Next colonoscopy in 5-10 years with history of simple adenomas.  . COLONOSCOPY  06/14/2006   ZOX:WRUEAV ADENOMAS(2), pTICS, [place] lipoma  . ESOPHAGOGASTRODUODENOSCOPY  08/12/2007   WUJ:[id number] esophagus without evidence of Barrett's,mass, erosion ulceration or stricture.  The GE junction was at 40 cm.  The Bravo  capsule was placed at 34 cm/ Unable to appreciate a hiatal hernia in the retroflexed viewNormal duodenal bulb and second portion of the duodenum. mild gastritis.   Marland Kitchen EYE  SURGERY    . SHOULDER SURGERY     Rotator cuff repair, 2012  . SPINAL CORD STIMULATOR INSERTION    . Stomach surgery gastropexy for gastric volvulus  2009  . Total hip replacement - right  2002  . TOTAL SHOULDER ARTHROPLASTY Left 01/30/2017   Procedure: LEFT TOTAL SHOULDER ARTHROPLASTY;  Surgeon: Cammy Copa, MD;  Location: [facility] OR;  Service: Orthopedics;  Laterality: Left;  . TUBAL LIGATION    . Tubular Adenoma    . VESICOVAGINAL FISTULA CLOSURE W/ TAH     Social History   Socioeconomic History  . Marital status: Legally Separated    Spouse name: None  . Number of children: None  . Years of education: 12th grade  . Highest education level: None  Social Needs  . Financial resource strain: None  . Food insecurity - worry: None  . Food insecurity - inability: None  . Transportation needs - medical: None  . Transportation needs - non-medical: None  Occupational History  . Occupation:  Retired    Comment: Building surveyor: RETIRED  Tobacco Use  . Smoking status: Former Smoker    Types: Cigarettes  . Smokeless tobacco: Never Used  Substance and Sexual Activity  . Alcohol use: No  . Drug use: No  . Sexual activity: None  Other Topics Concern  . None  Social History Narrative  . None   Outpatient Encounter Medications as of 05/15/2017  Medication Sig  . albuterol (PROVENTIL) (2.5 MG/3ML) 0.083% nebulizer solution Take 3 mLs (2.5 mg total) by nebulization every 6 (six) hours as needed for wheezing or shortness of breath.  . benzonatate (TESSALON) 100 MG capsule Take 1 capsule (100 mg total) by mouth every 8 (eight) hours. (Patient taking differently: Take 100 mg by mouth daily as needed for cough. )  . Cholecalciferol (VITAMIN D3) 5000 units CAPS Take 5,000 Units by mouth daily.  . colchicine 0.6 MG tablet Take 1 tablet (0.6 mg total) by mouth every 6 (six) hours. (Patient taking differently: Take 0.6 mg by mouth daily as needed (for gout). )  . Cyanocobalamin  (VITAMIN B 12 PO) Take 1 tablet by mouth daily.  Marland Kitchen dicyclomine (BENTYL) 10 MG capsule Take 1 capsule 2-3 times daily 30 minutes before a meal for abdominal cramping and loose stools. (Patient taking differently: Take 10 mg by mouth 3 (three) times daily as needed for spasms. )  . doxepin (SINEQUAN) 25 MG capsule Take 75 mg by mouth at bedtime.   . Fluticasone-Salmeterol (ADVAIR) 250-50 MCG/DOSE AEPB Inhale 1 puff into the lungs 2 (two) times daily as needed (for shortness of breath).  . furosemide (LASIX) 20 MG tablet Take 20 mg by mouth daily.   Marland Kitchen glipiZIDE (GLUCOTROL) 5 MG tablet Take 1 tablet (5 mg total) by mouth daily before breakfast.  . guaiFENesin (MUCINEX) 600 MG 12 hr tablet Take 600 mg by mouth 2 (two) times daily as needed for cough or to loosen phlegm.  Marland Kitchen HYDROmorphone (DILAUDID) 2 MG tablet 1 po q d prn  . levothyroxine (SYNTHROID, LEVOTHROID) 75 MCG tablet Take 75 mcg by mouth daily.  Marland Kitchen linagliptin (TRADJENTA) 5 MG TABS tablet Take 1 tablet (5 mg total) by mouth daily.  Marland Kitchen losartan (COZAAR) 25 MG tablet Take 25 mg by mouth daily.   . meloxicam (MOBIC) 7.5 MG tablet Take 1 tablet (7.5 mg total) by mouth daily. (Patient not taking: Reported on 02/27/2017)  . omeprazole (PRILOSEC) 20 MG capsule Take 20 mg by mouth 2 (two) times daily.   Marland Kitchen PROAIR HFA 108 (90 Base) MCG/ACT inhaler Inhale 2 puffs into the lungs every 6 (six) hours as needed for wheezing.   . [DISCONTINUED] HYDROmorphone (DILAUDID) 2 MG tablet 1 po q 12 hrs prn pain  . [DISCONTINUED] phenazopyridine (PYRIDIUM) 200 MG tablet Take 200 mg by mouth 3 (three) times daily as needed for pain.   Facility-Administered Encounter Medications as of 05/15/2017  Medication  . vancomycin (VANCOCIN) IVPB 1000 mg/200 mL premix    ALLERGIES: Allergies  Allergen Reactions  . Oxycodone Anaphylaxis and Itching  . Codeine Rash  . Hydrocodone Itching and Nausea And Vomiting  . Naproxen Nausea And Vomiting and Other (See Comments)     Hallucinations    VACCINATION STATUS: Immunization History  Administered Date(s) Administered  . Influenza Whole 12/25/2007  . Pneumococcal Polysaccharide-23 12/25/2007    Diabetes  She presents for her follow-up diabetic visit. She has type 2 diabetes mellitus. Onset time: She was diagnosed at approximate age of  77 years.. Her disease course has been improving. There are no hypoglycemic associated symptoms. Pertinent negatives for hypoglycemia include no confusion, headaches, pallor or seizures. Associated symptoms include fatigue. Pertinent negatives for diabetes include no chest pain, no polydipsia, no polyphagia and no polyuria. There are no hypoglycemic complications. Symptoms are improving. Diabetic complications include nephropathy. Risk factors for coronary artery disease include diabetes mellitus, dyslipidemia, family history, hypertension, obesity, sedentary lifestyle and tobacco exposure. Her weight is stable. She is following a generally unhealthy diet. When asked about meal planning, she reported none. She has not had a previous visit with a dietitian. She never participates in exercise. Her breakfast blood glucose range is generally 130-140 mg/dl. Her bedtime blood glucose range is generally 130-140 mg/dl. Her overall blood glucose range is 130-140 mg/dl. An ACE inhibitor/angiotensin II receptor blocker is being taken. Eye exam is current.  Hyperlipidemia  This is a chronic problem. The current episode started more than 1 year ago. The problem is uncontrolled. Recent lipid tests were reviewed and are high. Exacerbating diseases include diabetes, hypothyroidism and obesity. Pertinent negatives include no chest pain, myalgias or shortness of breath. She is currently on no antihyperlipidemic treatment. Risk factors for coronary artery disease include diabetes mellitus, dyslipidemia, hypertension, obesity and a sedentary lifestyle.  Hypertension  This is a chronic problem. The current  episode started more than 1 year ago. The problem is controlled. Pertinent negatives include no chest pain, headaches, palpitations or shortness of breath. Risk factors for coronary artery disease include diabetes mellitus, dyslipidemia, obesity, sedentary lifestyle and smoking/tobacco exposure. Past treatments include angiotensin blockers and diuretics.    Review of Systems  Constitutional: Positive for fatigue. Negative for chills, fever and unexpected weight change.  HENT: Negative for trouble swallowing and voice change.   Eyes: Negative for visual disturbance.  Respiratory: Negative for cough, shortness of breath and wheezing.   Cardiovascular: Negative for chest pain, palpitations and leg swelling.  Gastrointestinal: Negative for diarrhea, nausea and vomiting.  Endocrine: Negative for cold intolerance, heat intolerance, polydipsia, polyphagia and polyuria.  Musculoskeletal: Positive for gait problem. Negative for arthralgias and myalgias.  Skin: Negative for color change, pallor, rash and wound.  Neurological: Negative for seizures and headaches.  Psychiatric/Behavioral: Negative for confusion and suicidal ideas.    Objective:    BP 136/79   Pulse 80   Ht 5' (1.524 m)   Wt 208 lb (94.3 kg)   BMI 40.62 kg/m   Wt Readings from Last 3 Encounters:  05/15/17 208 lb (94.3 kg)  04/09/17 207 lb (93.9 kg)  03/06/17 206 lb (93.4 kg)     Physical Exam  Constitutional: She is oriented to person, place, and time. She appears well-developed.  Uses a cane to walk due to recent hip replacement and diffuse joint arthritis.   HENT:  Head: Normocephalic and atraumatic.  Eyes: EOM are normal.  Neck: Normal range of motion. Neck supple. No tracheal deviation present. No thyromegaly present.  Cardiovascular: Normal rate and regular rhythm.  Pulmonary/Chest: Effort normal and breath sounds normal.  Abdominal: Soft. Bowel sounds are normal. There is no tenderness. There is no guarding.   Musculoskeletal: She exhibits no edema.  Neurological: She is alert and oriented to person, place, and time. She has normal reflexes. No cranial nerve deficit. Coordination normal.  Skin: Skin is warm and dry. No rash noted. No erythema. No pallor.  Psychiatric: She has a normal mood and affect. Judgment normal.      CMP ( most recent) CMP  Component Value Date/Time   NA 140 05/09/2017 1536   NA 142 07/10/2012 1135   K 4.5 05/09/2017 1536   K 4.4 07/10/2012 1135   CL 108 05/09/2017 1536   CL 111 (H) 07/10/2012 1135   CO2 24 05/09/2017 1536   CO2 25 07/10/2012 1135   GLUCOSE 121 (H) 05/09/2017 1536   GLUCOSE 148 (H) 07/10/2012 1135   BUN 16 05/09/2017 1536   BUN 22 (H) 07/10/2012 1135   CREATININE 1.32 (H) 05/09/2017 1536   CALCIUM 8.8 05/09/2017 1536   CALCIUM 8.8 07/10/2012 1135   PROT 7.3 05/09/2017 1536   ALBUMIN 3.8 02/14/2017 1647   AST 23 05/09/2017 1536   ALT 24 05/09/2017 1536   ALKPHOS 128 (H) 02/14/2017 1647   BILITOT 0.4 05/09/2017 1536   GFRNONAA 39 (L) 05/09/2017 1536   GFRAA 45 (L) 05/09/2017 1536     Diabetic Labs (most recent): Lab Results  Component Value Date   HGBA1C 6.6 (H) 05/09/2017   HGBA1C 8.1 (H) 01/30/2017   HGBA1C 8.2 (H) 10/10/2016     Lipid Panel ( most recent) Lipid Panel     Component Value Date/Time   CHOL 171 05/09/2017 1536   TRIG 120 05/09/2017 1536   HDL 62 05/09/2017 1536   CHOLHDL 2.8 05/09/2017 1536   VLDL 22 06/23/2008 2159   LDLCALC 103 (H) 06/23/2008 2159      Lab Results  Component Value Date   TSH 0.31 (L) 05/09/2017   TSH 2.886 11/20/2007   TSH 1.589 11/06/2007   FREET4 1.1 05/09/2017      Assessment & Plan:   1. Type 2 diabetes mellitus with stage 4 chronic kidney disease, without long-term current use of insulin (HCC)  - Jeb Leveringnez H Kersting has currently uncontrolled symptomatic type 2 DM since  77 years of age. -She came with controlled glycemic profile to target, A1c improved to 6.6% from 8.1%.   -Recent labs reviewed.  -her diabetes is complicated by stage 3- 4 renal insufficiency, obesity/sedentary life, history of smoking and Jeb Leveringnez H Mahabir remains at a high risk for more acute and chronic complications which include CAD, CVA, CKD, retinopathy, and neuropathy. These are all discussed in detail with the patient.  - I have counseled her on diet management and weight loss, by adopting a carbohydrate restricted/protein rich diet.  -  Suggestion is made for her to avoid simple carbohydrates  from her diet including Cakes, Sweet Desserts / Pastries, Ice Cream, Soda (diet and regular), Sweet Tea, Candies, Chips, Cookies, Store Bought Juices, Alcohol in Excess of  1-2 drinks a day, Artificial Sweeteners, and "Sugar-free" Products. This will help patient to have stable blood glucose profile and potentially avoid unintended weight gain.  - I encouraged her to switch to  unprocessed or minimally processed complex starch and increased protein intake (animal or plant source), fruits, and vegetables.  - she is advised to stick to a routine mealtimes to eat 3 meals  a day and avoid unnecessary snacks ( to snack only to correct hypoglycemia).   - she will be scheduled with Norm SaltPenny Crumpton, RDN, CDE for individualized diabetes education- consult pending.  - I have approached her with the following individualized plan to manage diabetes and patient agrees:   -Based on her presentation with controlled glycemic profile and A1c at 6.6%, she will not require insulin treatment at this time.   - I advised her to continue  glipizide 5 mg by mouth daily with breakfast along with Tradjenta 5  mg by mouth daily.  -She agrees to continue monitoring blood glucose twice a day-before breakfast and at bedtime.  -Patient is encouraged to call clinic for blood glucose levels less than 70 or above 300 mg /dl.  - She is status post evaluation by nephrologist and urologist.  - Patient specific target  A1c;  LDL, HDL,  Triglycerides, and  Waist Circumference were discussed in detail.  2) BP/HTN: Her blood pressure is controlled to target.   I advised patient to continue  current medications including ACEI/ARB. 3) Lipids/HPL:   Uncontrolled, LDL at 103.  Patient's medication list does not include statin. Patient is advised to bring all her medications next visit.  4)  Weight/Diet: CDE Consult will be initiated , exercise, and detailed carbohydrates information provided.  5) Hypothyroidism - Etiology unclear.  Her thyroid function tests are consistent with appropriate replacement.  I advised her to continue levothyroxine 75 mcg p.o. every morning.   - We discussed about correct intake of levothyroxine, at fasting, with water, separated by at least 30 minutes from breakfast, and separated by more than 4 hours from calcium, iron, multivitamins, acid reflux medications (PPIs). -Patient is made aware of the fact that thyroid hormone replacement is needed for life, dose to be adjusted by periodic monitoring of thyroid function tests.  6) Chronic Care/Health Maintenance:  -she  is on ARB   is encouraged to continue to follow up with Ophthalmology, Dentist,  Podiatrist at least yearly or according to recommendations, and advised to   stay away from smoking. I have recommended yearly flu vaccine and pneumonia vaccination at least every 5 years; moderate intensity exercise for up to 150 minutes weekly; and  sleep for at least 7 hours a day.  - I advised patient to maintain close follow up with Gareth Morgan, MD for primary care needs.  - Time spent with the patient: 25 min, of which >50% was spent in reviewing her blood glucose logs , discussing her hypo- and hyper-glycemic episodes, reviewing her current and  previous labs and insulin doses and developing a plan to avoid hypo- and hyper-glycemia. Please refer to Patient Instructions for Blood Glucose Monitoring and Insulin/Medications Dosing Guide"  in media tab for  additional information.   Follow up plan: - Return in about 3 months (around 08/12/2017) for meter, and logs.  Marquis Lunch, MD [facility] [address] [place], [place] [address] Phone: [phone]  Fax: [phone]    05/15/2017, 12:33 PM  This note was partially dictated with voice recognition software. Similar sounding words can be transcribed inadequately or may not  be corrected upon review.

## 2017-05-15 NOTE — Patient Instructions (Signed)

## 2017-05-16 ENCOUNTER — Ambulatory Visit: Payer: Medicare Other | Admitting: Nutrition

## 2017-05-16 ENCOUNTER — Ambulatory Visit: Payer: Medicare Other | Admitting: "Endocrinology

## 2017-05-17 ENCOUNTER — Other Ambulatory Visit: Payer: Medicare Other

## 2017-06-13 ENCOUNTER — Encounter (INDEPENDENT_AMBULATORY_CARE_PROVIDER_SITE_OTHER): Payer: Self-pay | Admitting: Radiology

## 2017-06-13 ENCOUNTER — Telehealth: Payer: Self-pay | Admitting: "Endocrinology

## 2017-06-13 ENCOUNTER — Ambulatory Visit
Admission: RE | Admit: 2017-06-13 | Discharge: 2017-06-13 | Disposition: A | Discharge status: Home / Self Care | Payer: Medicare Other | Source: Ambulatory Visit | Attending: Nephrology | Admitting: Nephrology

## 2017-06-13 DIAGNOSIS — N183 Chronic kidney disease, stage 3 unspecified: Secondary | ICD-10-CM

## 2017-06-13 MED ORDER — LINAGLIPTIN 5 MG PO TABS: 5.0000 mg | ORAL_TABLET | Freq: Every day | ORAL | 2 refills | Status: DC

## 2017-06-13 NOTE — Telephone Encounter (Signed)
[name] Is asking for a refill onTRADJENTA 5 MG TABS tablets sent to [facility], please advise?

## 2017-06-14 ENCOUNTER — Inpatient Hospital Stay
Admission: RE | Admit: 2017-06-14 | Discharge: 2017-06-14 | Disposition: A | Discharge status: Home / Self Care | Payer: Medicare Other | Source: Ambulatory Visit | Attending: Nephrology | Admitting: Nephrology

## 2017-06-18 ENCOUNTER — Telehealth (INDEPENDENT_AMBULATORY_CARE_PROVIDER_SITE_OTHER): Payer: Self-pay | Admitting: Orthopedic Surgery

## 2017-06-18 NOTE — Telephone Encounter (Signed)
Please advise. Thanks.  

## 2017-06-18 NOTE — Telephone Encounter (Signed)
Patient called stating [facility] will not do the MRI do to her having a spinal cord stimulator and she wants to know what Dr. August Saucerean wants to do next since she cannot do the MRI. Callback at  [phone]

## 2017-06-18 NOTE — Telephone Encounter (Signed)
Try CT with contrast of the femur to evaluate that hamstring around the knee thanks

## 2017-06-19 ENCOUNTER — Other Ambulatory Visit (INDEPENDENT_AMBULATORY_CARE_PROVIDER_SITE_OTHER): Payer: Self-pay

## 2017-06-19 DIAGNOSIS — M79605 Pain in left leg: Secondary | ICD-10-CM

## 2017-06-19 NOTE — Telephone Encounter (Signed)
Order sent for scan. Patient aware.

## 2017-06-20 ENCOUNTER — Ambulatory Visit
Admission: RE | Admit: 2017-06-20 | Discharge: 2017-06-20 | Disposition: A | Discharge status: Home / Self Care | Payer: Medicare Other | Source: Ambulatory Visit | Attending: Orthopedic Surgery | Admitting: Orthopedic Surgery

## 2017-06-20 DIAGNOSIS — M79605 Pain in left leg: Secondary | ICD-10-CM

## 2017-06-20 MED ORDER — IOPAMIDOL (ISOVUE-300) INJECTION 61%
100.0000 mL | Freq: Once | INTRAVENOUS | Status: AC | PRN
  Administered 2017-06-20: 100 mL via INTRAVENOUS

## 2017-07-06 ENCOUNTER — Encounter (INDEPENDENT_AMBULATORY_CARE_PROVIDER_SITE_OTHER): Payer: Self-pay | Admitting: Orthopedic Surgery

## 2017-07-06 ENCOUNTER — Ambulatory Visit (INDEPENDENT_AMBULATORY_CARE_PROVIDER_SITE_OTHER): Payer: Medicare Other | Admitting: Orthopedic Surgery

## 2017-07-06 DIAGNOSIS — M545 Low back pain: Secondary | ICD-10-CM | POA: Diagnosis not present

## 2017-07-06 NOTE — Progress Notes (Signed)
Office Visit Note   Patient: [name]           Date of Birth: 31-Dec-1940           MRN: [id number] Visit Date: 07/06/2017 Requested by: Gareth Morgan, MD [address], [place] [address] PCP: Gareth Morgan, MD  Subjective: Chief Complaint  Patient presents with  . Follow-up    review CT scan    HPI: [name] is a patient who is here to follow-up CT scan of left femoral region.  Since I have seen her she has had CT scan of this area because she was fairly convinced that she had some type of problem in the posterior aspect of the left leg.  CT scan of this region was normal.  She has a spinal cord stimulator and cannot undergo MRI scanning.  She has not had back surgery.  The CT scan shows mild left hip arthritis and medial sided knee arthritis.              ROS: All systems reviewed are negative as they relate to the chief complaint within the history of present illness.  Patient denies  fevers or chills.   Assessment & Plan: Visit Diagnoses:  1. Low back pain, unspecified back pain laterality, unspecified chronicity, with sciatica presence unspecified     Plan: Impression is likely low back pain source of this posterior leg pain.  Knee and hip exams are normal.  Structurally the hamstring tendons are intact.  Anatomically no abnormality is seen on CT scan.  I think this really may be referred pain from the back.  She has a spinal cord stimulator in place.  Need CT myelogram to look at that lumbar spine little bit closer.  Refer her to spine physician after that.  Follow-Up Instructions: No follow-ups on file.   Orders:  Orders Placed This Encounter  Procedures  . CT LUMBAR SPINE W CONTRAST  . DG Myelogram Lumbar   No orders of the defined types were placed in this encounter.     Procedures: No procedures performed   Clinical Data: No additional findings.  Objective: Vital Signs: There were no vitals taken for this visit.  Physical Exam:    Constitutional: Patient appears well-developed HEENT:  Head: Normocephalic Eyes:EOM are normal Neck: Normal range of motion Cardiovascular: Normal rate Pulmonary/chest: Effort normal Neurologic: Patient is alert Skin: Skin is warm Psychiatric: Patient has normal mood and affect    Ortho Exam: Orthopedic exam demonstrates full active and passive range of motion of both legs and ankles and feet.  No groin pain with internal/external rotation of either leg.  No definite paresthesias L1-S1 bilaterally.  Pedal pulses palpable.  Some pain with forward and lateral bending.  Specialty Comments:  No specialty comments available.  Imaging: No results found.   PMFS History: Patient Active Problem List   Diagnosis Date Noted  . Hypothyroidism 02/27/2017  . AKI (acute kidney injury) (HCC) 02/01/2017  . Shoulder arthritis 01/30/2017  . Chronic knee pain (Bilateral) 08/09/2015  . Chronic pain 08/09/2015  . Spinal cord stimulator status 08/09/2015  . Primary osteoarthritis of knee (Bilateral) 02/23/2014  . Osteoarthritis of knee (Bilateral) 08/12/2013  . PVC's (premature ventricular contractions) 07/20/2010  . ANXIETY 06/18/2007  . Type 2 diabetes mellitus with stage 4 chronic kidney disease, without long-term current use of insulin (HCC) 05/13/2007  . OBESITY, MORBID 05/13/2007  . DEPRESSION 05/13/2007  . Essential hypertension, benign 05/13/2007  . ASTHMA 05/13/2007  .  GERD 05/13/2007  . IBS 05/13/2007  . CKD (chronic kidney disease) stage 3, GFR 30-59 ml/min (HCC) 05/13/2007  . ARTHRITIS 05/13/2007  . SLEEP APNEA 05/13/2007   Past Medical History:  Diagnosis Date  . Anxiety   . Arthritis   . Asthma   . Carotid artery aneurysm [facility](HCC)    Right s/p endovascular treatment 2004  . Carpal tunnel syndrome   . Cataract   . CKD (chronic kidney disease) stage 3, GFR 30-59 ml/min (HCC)   . Depression   . Essential hypertension, benign   . GERD (gastroesophageal reflux disease)    . Glaucoma   . History of recurrent UTIs   . Hx of colonic polyps   . Hx of migraines   . Hypothyroidism   . IBS (irritable bowel syndrome)   . Low back pain   . Morbid obesity (HCC)   . Neurogenic bladder   . Osteoporosis   . Pneumonia   . Sleep apnea    Intolerant to CPAP   . Stroke (HCC)   . Type 2 diabetes mellitus with diabetic neuropathy (HCC)     Family History  Problem Relation Age of Onset  . Heart disease Mother   . Cancer Mother   . Diabetes Brother   . Cancer Brother        Prostate   . Cancer Brother        Pancreatic   . Cancer Sister        Breast   . Heart attack Sister   . Lung disease Unknown   . Asthma Unknown   . Colon cancer Neg Hx   . Colon polyps Neg Hx     Past Surgical History:  Procedure Laterality Date  . ABDOMINAL HYSTERECTOMY    . CARDIAC CATHETERIZATION     1980 or 1990  . Carpal tunnel release - bilateral  1992  . CATARACT EXTRACTION W/ INTRAOCULAR LENS IMPLANT Bilateral   . CEREBRAL ANEURYSM REPAIR  2004   Coil   . COLONOSCOPY N/A 03/04/2013   Dr. Darrick PennaFields: moderate diverticulosis, moderate internal hemorrhoids. Next colonoscopy in 5-10 years with history of simple adenomas.  . COLONOSCOPY  06/14/2006   XBJ:[id number]:SIMPLE ADENOMAS(2), pTICS,  lipoma  . ESOPHAGOGASTRODUODENOSCOPY  08/12/2007   AOZ:[id number]:Normal esophagus without evidence of Barrett's,mass, erosion ulceration or stricture.  The GE junction was at 40 cm.  The Bravo  capsule was placed at 34 cm/ Unable to appreciate a hiatal hernia in the retroflexed viewNormal duodenal bulb and second portion of the duodenum. mild gastritis.   Marland Kitchen. EYE SURGERY    . SHOULDER SURGERY     Rotator cuff repair, 2012  . SPINAL CORD STIMULATOR INSERTION    . Stomach surgery gastropexy for gastric volvulus  2009  . Total hip replacement - right  2002  . TOTAL SHOULDER ARTHROPLASTY Left 01/30/2017   Procedure: LEFT TOTAL SHOULDER ARTHROPLASTY;  Surgeon: Cammy Copaean, Gregory Scott, MD;  [facility];  Service:  Orthopedics;  Laterality: Left;  . TUBAL LIGATION    . Tubular Adenoma    . VESICOVAGINAL FISTULA CLOSURE W/ TAH     Social History   Occupational History  . 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 Smoking status: Former Smoker    Types: Cigarettes  . Smokeless tobacco: Never Used  Substance and Sexual Activity  . Alcohol use: No  . Drug use: No  . Sexual activity: Not on file

## 2017-07-17 ENCOUNTER — Other Ambulatory Visit: Payer: Self-pay | Admitting: Orthopedic Surgery

## 2017-07-20 ENCOUNTER — Other Ambulatory Visit: Payer: Medicare Other

## 2017-07-23 ENCOUNTER — Other Ambulatory Visit (INDEPENDENT_AMBULATORY_CARE_PROVIDER_SITE_OTHER): Payer: Self-pay

## 2017-07-23 ENCOUNTER — Ambulatory Visit
Admission: RE | Admit: 2017-07-23 | Discharge: 2017-07-23 | Disposition: A | Discharge status: Home / Self Care | Payer: Medicare Other | Source: Ambulatory Visit | Attending: Orthopedic Surgery | Admitting: Orthopedic Surgery

## 2017-07-23 DIAGNOSIS — M79605 Pain in left leg: Secondary | ICD-10-CM

## 2017-07-23 DIAGNOSIS — M545 Low back pain: Secondary | ICD-10-CM

## 2017-07-23 MED ORDER — DIAZEPAM 5 MG PO TABS
5.0000 mg | ORAL_TABLET | Freq: Once | ORAL | Status: AC
  Administered 2017-07-23: 5 mg via ORAL

## 2017-07-23 MED ORDER — ONDANSETRON HCL 4 MG/2ML IJ SOLN: 4.0000 mg | Freq: Four times a day (QID) | INTRAMUSCULAR | Status: DC | PRN

## 2017-07-23 MED ORDER — IOPAMIDOL (ISOVUE-M 200) INJECTION 41%
15.0000 mL | Freq: Once | INTRAMUSCULAR | Status: AC
  Administered 2017-07-23: 15 mL via INTRATHECAL

## 2017-07-23 MED ORDER — MEPERIDINE HCL 50 MG/ML IJ SOLN: 50.0000 mg | Freq: Once | INTRAMUSCULAR | Status: DC

## 2017-07-23 MED ORDER — ONDANSETRON HCL 4 MG/2ML IJ SOLN: 4.0000 mg | Freq: Once | INTRAMUSCULAR | Status: DC

## 2017-07-23 NOTE — Progress Notes (Signed)
Please call patient with results. Thanks it looks like her leg pain is coming from her back.  I would have her see Dr. Alvester MorinNewton for consideration of injection in her back

## 2017-07-23 NOTE — Discharge Instructions (Signed)
Myelogram Discharge Instructions  1. Go home and rest quietly for the next 24 hours.  It is important to lie flat for the next 24 hours.  Get up only to go to the restroom.  You may lie in the bed or on a couch on your back, your stomach, your left side or your right side.  You may have one pillow under your head.  You may have pillows between your knees while you are on your side or under your knees while you are on your back.  2. DO NOT drive today.  Recline the seat as far back as it will go, while still wearing your seat belt, on the way home.  3. You may get up to go to the bathroom as needed.  You may sit up for 10 minutes to eat.  You may resume your normal diet and medications unless otherwise indicated.  Drink lots of extra fluids today and tomorrow.  4. The incidence of headache, nausea, or vomiting is about 5% (one in 20 patients).  If you develop a headache, lie flat and drink plenty of fluids until the headache goes away.  Caffeinated beverages may be helpful.  If you develop severe nausea and vomiting or a headache that does not go away with flat bed rest, call [phone].  5. You may resume normal activities after your 24 hours of bed rest is over; however, do not exert yourself strongly or do any heavy lifting tomorrow. If when you get up you have a headache when standing, go back to bed and force fluids for another 24 hours.  6. Call your physician for a follow-up appointment.  The results of your myelogram will be sent directly to your physician by the following day.  7. If you have any questions or if complications develop after you arrive home, please call [phone].  Discharge instructions have been explained to the patient.  The patient, or the person responsible for the patient, fully understands these instructions.  YOU MAY RESTART YOUR SINEQUAN TOMORROW 07/24/2017 AT 10:30AM.

## 2017-07-24 NOTE — Progress Notes (Signed)
Do not worry about calling her with results.  I called her and left a message on her machine.  In Thanks

## 2017-08-09 LAB — COMPLETE METABOLIC PANEL WITH GFR
AG Ratio: 1.2 (calc) (ref 1.0–2.5)
ALT: 15 U/L (ref 6–29)
AST: 21 U/L (ref 10–35)
Albumin: 4.1 g/dL (ref 3.6–5.1)
Alkaline phosphatase (APISO): 115 U/L (ref 33–130)
BILIRUBIN TOTAL: 0.5 mg/dL (ref 0.2–1.2)
BUN/Creatinine Ratio: 11 (calc) (ref 6–22)
BUN: 15 mg/dL (ref 7–25)
CHLORIDE: 107 mmol/L (ref 98–110)
CO2: 27 mmol/L (ref 20–32)
CREATININE: 1.32 mg/dL — AB (ref 0.60–0.93)
Calcium: 9.2 mg/dL (ref 8.6–10.4)
GFR, EST AFRICAN AMERICAN: 45 mL/min/{1.73_m2} — AB (ref >=60)
GFR, Est Non African American: 39 mL/min/{1.73_m2} — ABNORMAL LOW (ref >=60)
GLUCOSE: 123 mg/dL — AB (ref 65–99)
Globulin: 3.4 g/dL (calc) (ref 1.9–3.7)
Potassium: 4.6 mmol/L (ref 3.5–5.3)
Sodium: 141 mmol/L (ref 135–146)
TOTAL PROTEIN: 7.5 g/dL (ref 6.1–8.1)

## 2017-08-09 LAB — HEMOGLOBIN A1C
EAG (MMOL/L): 7.6 (calc)
Hgb A1c MFr Bld: 6.4 % of total Hgb — ABNORMAL HIGH (ref <5.7)
Mean Plasma Glucose: 137 (calc)

## 2017-08-09 LAB — T4, FREE: Free T4: 1.1 ng/dL (ref 0.8–1.8)

## 2017-08-09 LAB — TSH: TSH: 0.73 m[IU]/L (ref 0.40–4.50)

## 2017-08-13 ENCOUNTER — Other Ambulatory Visit: Payer: Self-pay | Admitting: "Endocrinology

## 2017-08-14 ENCOUNTER — Encounter: Payer: Self-pay | Admitting: "Endocrinology

## 2017-08-14 ENCOUNTER — Ambulatory Visit: Payer: Medicare Other | Admitting: "Endocrinology

## 2017-08-14 VITALS — BP 134/72 | HR 84 | Ht 60.0 in | Wt 209.0 lb

## 2017-08-14 DIAGNOSIS — E1122 Type 2 diabetes mellitus with diabetic chronic kidney disease: Secondary | ICD-10-CM | POA: Diagnosis not present

## 2017-08-14 DIAGNOSIS — E039 Hypothyroidism, unspecified: Secondary | ICD-10-CM | POA: Diagnosis not present

## 2017-08-14 DIAGNOSIS — I10 Essential (primary) hypertension: Secondary | ICD-10-CM

## 2017-08-14 DIAGNOSIS — N183 Chronic kidney disease, stage 3 (moderate): Secondary | ICD-10-CM | POA: Diagnosis not present

## 2017-08-14 MED ORDER — GLIPIZIDE 5 MG PO TABS: 5.0000 mg | ORAL_TABLET | Freq: Every day | ORAL | 1 refills | Status: DC

## 2017-08-14 NOTE — Progress Notes (Signed)
Consult Note       08/14/2017, 10:29 AM   Subjective:    Patient ID: [name], female    DOB: 02-19-1941.  BURMA KETCHER is being seen in consultation for management of currently uncontrolled symptomatic diabetes requested by  Gareth Morgan, MD.   Past Medical History:  Diagnosis Date  . Anxiety   . Arthritis   . Asthma   . Carotid artery aneurysm [facility])    Right s/p endovascular treatment 2004  . Carpal tunnel syndrome   . Cataract   . CKD (chronic kidney disease) stage 3, GFR 30-59 ml/min (HCC)   . Depression   . Essential hypertension, benign   . GERD (gastroesophageal reflux disease)   . Glaucoma   . History of recurrent UTIs   . Hx of colonic polyps   . Hx of migraines   . Hypothyroidism   . IBS (irritable bowel syndrome)   . Low back pain   . Morbid obesity (HCC)   . Neurogenic bladder   . Osteoporosis   . Pneumonia   . Sleep apnea    Intolerant to CPAP   . Stroke (HCC)   . Type 2 diabetes mellitus with diabetic neuropathy [facility])    Past Surgical History:  Procedure Laterality Date  . ABDOMINAL HYSTERECTOMY    . CARDIAC CATHETERIZATION     1980 or 1990  . Carpal tunnel release - bilateral  1992  . CATARACT EXTRACTION W/ INTRAOCULAR LENS IMPLANT Bilateral   . CEREBRAL ANEURYSM REPAIR  2004   Coil   . COLONOSCOPY N/A 03/04/2013   Dr. Darrick Penna: moderate diverticulosis, moderate internal hemorrhoids. Next colonoscopy in 5-10 years with history of simple adenomas.  . COLONOSCOPY  06/14/2006   ZOX:WRUEAV ADENOMAS(2), pTICS, [place] lipoma  . ESOPHAGOGASTRODUODENOSCOPY  08/12/2007   WUJ:WJXBJY esophagus without evidence of Barrett's,mass, erosion ulceration or stricture.  The GE junction was at 40 cm.  The Bravo  capsule was placed at 34 cm/ Unable to appreciate a hiatal hernia in the retroflexed viewNormal duodenal bulb and second portion of the duodenum. mild gastritis.   [facility] Kitchen EYE  SURGERY    . SHOULDER SURGERY     Rotator cuff repair, 2012  . SPINAL CORD STIMULATOR INSERTION    . Stomach surgery gastropexy for gastric volvulus  2009  . Total hip replacement - right  2002  . TOTAL SHOULDER ARTHROPLASTY Left 01/30/2017   Procedure: LEFT TOTAL SHOULDER ARTHROPLASTY;  Surgeon: Cammy Copa, MD;  Location: [facility] OR;  Service: Orthopedics;  Laterality: Left;  . TUBAL LIGATION    . Tubular Adenoma    . VESICOVAGINAL FISTULA CLOSURE W/ TAH     Social History   Socioeconomic History  . Marital status: Legally Separated    Spouse name: Not on file  . Number of children: Not on file  . Years of education: 12th grade  . Highest education level: Not on file  Occupational History  . Occupation: Retired    Comment: Building surveyor: RETIRED  Social Needs  . Financial resource strain: Not on file  . Food insecurity:    Worry:  Not on file    Inability: Not on file  . Transportation needs:    Medical: Not on file    Non-medical: Not on file  Tobacco Use  . Smoking status: Former Smoker    Types: Cigarettes  . Smokeless tobacco: Never Used  Substance and Sexual Activity  . Alcohol use: No  . Drug use: No  . Sexual activity: Not on file  Lifestyle  . Physical activity:    Days per week: Not on file    Minutes per session: Not on file  . Stress: Not on file  Relationships  . Social connections:    Talks on phone: Not on file    Gets together: Not on file    Attends religious service: Not on file    Active member of club or organization: Not on file    Attends meetings of clubs or organizations: Not on file    Relationship status: Not on file  Other Topics Concern  . Not on file  Social History Narrative  . Not on file   Outpatient Encounter Medications as of 08/14/2017  Medication Sig  . Cholecalciferol (VITAMIN D3) 5000 units CAPS Take 5,000 Units by mouth daily.  . Cyanocobalamin (VITAMIN B 12 PO) Take 1 tablet by mouth daily.  Marland Kitchen  dicyclomine (BENTYL) 10 MG capsule Take 1 capsule 2-3 times daily 30 minutes before a meal for abdominal cramping and loose stools. (Patient taking differently: Take 10 mg by mouth 3 (three) times daily as needed for spasms. )  . doxepin (SINEQUAN) 25 MG capsule Take 75 mg by mouth at bedtime.   . Fluticasone-Salmeterol (ADVAIR) 250-50 MCG/DOSE AEPB Inhale 1 puff into the lungs 2 (two) times daily as needed (for shortness of breath).  . furosemide (LASIX) 20 MG tablet Take 20 mg by mouth daily.   Marland Kitchen gabapentin (NEURONTIN) 100 MG capsule TAKE ONE CAPSULE BY MOUTH 2 TIMES A DAY AS NEEDED FOR PAIN.  . glipiZIDE (GLUCOTROL) 5 MG tablet Take 1 tablet (5 mg total) by mouth daily before breakfast.  . guaiFENesin (MUCINEX) 600 MG 12 hr tablet Take 600 mg by mouth 2 (two) times daily as needed for cough or to loosen phlegm.  Marland Kitchen levothyroxine (SYNTHROID, LEVOTHROID) 75 MCG tablet Take 75 mcg by mouth daily.  Marland Kitchen linagliptin (TRADJENTA) 5 MG TABS tablet Take 1 tablet (5 mg total) by mouth daily.  Marland Kitchen losartan (COZAAR) 25 MG tablet Take 25 mg by mouth daily.   Marland Kitchen omeprazole (PRILOSEC) 20 MG capsule Take 20 mg by mouth 2 (two) times daily.   Marland Kitchen PROAIR HFA 108 (90 Base) MCG/ACT inhaler Inhale 2 puffs into the lungs every 6 (six) hours as needed for wheezing.   . [DISCONTINUED] albuterol (PROVENTIL) (2.5 MG/3ML) 0.083% nebulizer solution Take 3 mLs (2.5 mg total) by nebulization every 6 (six) hours as needed for wheezing or shortness of breath. (Patient not taking: Reported on 07/10/2017)  . [DISCONTINUED] benzonatate (TESSALON) 100 MG capsule Take 1 capsule (100 mg total) by mouth every 8 (eight) hours. (Patient not taking: Reported on 07/10/2017)  . [DISCONTINUED] colchicine 0.6 MG tablet Take 1 tablet (0.6 mg total) by mouth every 6 (six) hours. (Patient not taking: Reported on 07/10/2017)  . [DISCONTINUED] glipiZIDE (GLUCOTROL) 5 MG tablet TAKE 1 TABLET DAILY BEFORE BREAKFAST.  . [DISCONTINUED] HYDROmorphone (DILAUDID) 2 MG  tablet 1 po q d prn (Patient not taking: Reported on 07/10/2017)  . [DISCONTINUED] meloxicam (MOBIC) 7.5 MG tablet Take 1 tablet (7.5 mg total) by  mouth daily. (Patient not taking: Reported on 02/27/2017)   Facility-Administered Encounter Medications as of 08/14/2017  Medication  . vancomycin (VANCOCIN) IVPB 1000 mg/200 mL premix    ALLERGIES: Allergies  Allergen Reactions  . Oxycodone Anaphylaxis and Itching  . Codeine Rash  . Hydrocodone Itching and Nausea And Vomiting  . Naproxen Nausea And Vomiting and Other (See Comments)    Hallucinations    VACCINATION STATUS: Immunization History  Administered Date(s) Administered  . Influenza Whole 12/25/2007  . Pneumococcal Polysaccharide-23 12/25/2007    Diabetes  She presents for her follow-up diabetic visit. She has type 2 diabetes mellitus. Onset time: She was diagnosed at approximate age of 45 years.. Her disease course has been improving. There are no hypoglycemic associated symptoms. Pertinent negatives for hypoglycemia include no confusion, headaches, pallor or seizures. Associated symptoms include fatigue. Pertinent negatives for diabetes include no chest pain, no polydipsia, no polyphagia and no polyuria. There are no hypoglycemic complications. Symptoms are improving. Diabetic complications include nephropathy. Risk factors for coronary artery disease include diabetes mellitus, dyslipidemia, family history, hypertension, obesity, sedentary lifestyle and tobacco exposure. Her weight is stable. She is following a generally unhealthy diet. When asked about meal planning, she reported none. She has not had a previous visit with a dietitian. She never participates in exercise. Her breakfast blood glucose range is generally 130-140 mg/dl. Her bedtime blood glucose range is generally 130-140 mg/dl. Her overall blood glucose range is 130-140 mg/dl. An ACE inhibitor/angiotensin II receptor blocker is being taken. Eye exam is current.   Hyperlipidemia  This is a chronic problem. The current episode started more than 1 year ago. The problem is uncontrolled. Recent lipid tests were reviewed and are high. Exacerbating diseases include diabetes, hypothyroidism and obesity. Pertinent negatives include no chest pain, myalgias or shortness of breath. She is currently on no antihyperlipidemic treatment. Risk factors for coronary artery disease include diabetes mellitus, dyslipidemia, hypertension, obesity, a sedentary lifestyle and post-menopausal.  Hypertension  This is a chronic problem. The current episode started more than 1 year ago. The problem is controlled. Pertinent negatives include no chest pain, headaches, palpitations or shortness of breath. Risk factors for coronary artery disease include diabetes mellitus, dyslipidemia, obesity, sedentary lifestyle, smoking/tobacco exposure and post-menopausal state. Past treatments include angiotensin blockers and diuretics.    Review of Systems  Constitutional: Positive for fatigue. Negative for chills, fever and unexpected weight change.  HENT: Negative for trouble swallowing and voice change.   Eyes: Negative for visual disturbance.  Respiratory: Negative for cough, shortness of breath and wheezing.   Cardiovascular: Negative for chest pain, palpitations and leg swelling.  Gastrointestinal: Negative for diarrhea, nausea and vomiting.  Endocrine: Negative for cold intolerance, heat intolerance, polydipsia, polyphagia and polyuria.  Musculoskeletal: Positive for gait problem. Negative for arthralgias and myalgias.  Skin: Negative for color change, pallor, rash and wound.  Neurological: Negative for seizures and headaches.  Psychiatric/Behavioral: Negative for confusion and suicidal ideas.    Objective:    BP 134/72   Pulse 84   Ht 5' (1.524 m)   Wt 209 lb (94.8 kg)   BMI 40.82 kg/m   Wt Readings from Last 3 Encounters:  08/14/17 209 lb (94.8 kg)  05/15/17 208 lb (94.3 kg)   04/09/17 207 lb (93.9 kg)     Physical Exam  Constitutional: She is oriented to person, place, and time. She appears well-developed.  Uses a cane to walk due to recent hip replacement and diffuse joint arthritis.   HENT:  Head: Normocephalic and atraumatic.  Eyes: EOM are normal.  Neck: Normal range of motion. Neck supple. No tracheal deviation present. No thyromegaly present.  Cardiovascular: Normal rate and regular rhythm.  Pulmonary/Chest: Effort normal and breath sounds normal.  Abdominal: Soft. Bowel sounds are normal. There is no tenderness. There is no guarding.  Musculoskeletal: She exhibits no edema.  Neurological: She is alert and oriented to person, place, and time. She has normal reflexes. No cranial nerve deficit. Coordination normal.  Skin: Skin is warm and dry. No rash noted. No erythema. No pallor.  Psychiatric: She has a normal mood and affect. Judgment normal.    CMP ( most recent) CMP     Component Value Date/Time   NA 141 08/08/2017 1202   NA 142 07/10/2012 1135   K 4.6 08/08/2017 1202   K 4.4 07/10/2012 1135   CL 107 08/08/2017 1202   CL 111 (H) 07/10/2012 1135   CO2 27 08/08/2017 1202   CO2 25 07/10/2012 1135   GLUCOSE 123 (H) 08/08/2017 1202   GLUCOSE 148 (H) 07/10/2012 1135   BUN 15 08/08/2017 1202   BUN 22 (H) 07/10/2012 1135   CREATININE 1.32 (H) 08/08/2017 1202   CALCIUM 9.2 08/08/2017 1202   CALCIUM 8.8 07/10/2012 1135   PROT 7.5 08/08/2017 1202   ALBUMIN 3.8 02/14/2017 1647   AST 21 08/08/2017 1202   ALT 15 08/08/2017 1202   ALKPHOS 128 (H) 02/14/2017 1647   BILITOT 0.5 08/08/2017 1202   GFRNONAA 39 (L) 08/08/2017 1202   GFRAA 45 (L) 08/08/2017 1202     Diabetic Labs (most recent): Lab Results  Component Value Date   HGBA1C 6.4 (H) 08/08/2017   HGBA1C 6.6 (H) 05/09/2017   HGBA1C 8.1 (H) 01/30/2017     Lipid Panel ( most recent) Lipid Panel     Component Value Date/Time   CHOL 171 05/09/2017 1536   TRIG 120 05/09/2017 1536    HDL 62 05/09/2017 1536   CHOLHDL 2.8 05/09/2017 1536   VLDL 22 06/23/2008 2159   LDLCALC 87 05/09/2017 1536      Lab Results  Component Value Date   TSH 0.73 08/08/2017   TSH 0.31 (L) 05/09/2017   TSH 2.886 11/20/2007   TSH 1.589 11/06/2007   FREET4 1.1 08/08/2017   FREET4 1.1 05/09/2017      Assessment & Plan:   1. Type 2 diabetes mellitus with stage 3 chronic kidney disease, without long-term current use of insulin (HCC)  - SHANNAN SLINKER has currently uncontrolled symptomatic type 2 DM since  77 years of age. -She came with controlled glycemic profile to target, A1c improved to 6.4%, generally improving from 8.1%.  -Recent labs reviewed.  -her diabetes is complicated by stage 3 renal insufficiency, obesity/sedentary life, history of smoking and DELAINY MCELHINEY remains at a high risk for more acute and chronic complications which include CAD, CVA, CKD, retinopathy, and neuropathy. These are all discussed in detail with the patient.  - I have counseled her on diet management and weight loss, by adopting a carbohydrate restricted/protein rich diet.  -  Suggestion is made for her to avoid simple carbohydrates  from her diet including Cakes, Sweet Desserts / Pastries, Ice Cream, Soda (diet and regular), Sweet Tea, Candies, Chips, Cookies, Store Bought Juices, Alcohol in Excess of  1-2 drinks a day, Artificial Sweeteners, and "Sugar-free" Products. This will help patient to have stable blood glucose profile and potentially avoid unintended weight gain.   - I encouraged  her to switch to  unprocessed or minimally processed complex starch and increased protein intake (animal or plant source), fruits, and vegetables.  - she is advised to stick to a routine mealtimes to eat 3 meals  a day and avoid unnecessary snacks ( to snack only to correct hypoglycemia).   - she has been scheduled with Norm Salt, RDN, CDE for individualized diabetes education- consult pending.  - I have  approached her with the following individualized plan to manage diabetes and patient agrees:   -Based on her presentation with controlled glycemic profile and A1c at 6.4%, she will not require insulin treatment at this time.    -I advised her to continue glipizide 5 mg p.o. daily with breakfast along with Tradjenta 5 mg p.o. daily with breakfast as well.   -She is asked to monitor blood glucose 2 times daily -before breakfast and at bedtime.  -Patient is encouraged to call clinic for blood glucose levels less than 70 or above 300 mg /dl.  - She is status post evaluation by nephrologist and urologist.  - Patient specific target  A1c;  LDL, HDL, Triglycerides, and  Waist Circumference were discussed in detail.  2) BP/HTN: Her blood pressure is controlled to target.  I advised her to continue her current blood pressure medications including Cozaar 25 mg p.o. Daily.  3) Lipids/HPL: Her recent lipid panel showed controlled LDL at 87, proving from 103. -She did not bring her medications for review today.  Patient's medication list does not include statin. Patient is advised to bring all her medications next visit.  4)  Weight/Diet: CDE Consult has been  initiated , exercise, and detailed carbohydrates information provided.  5) Hypothyroidism - Etiology unclear.  Her thyroid function tests are consistent with appropriate replacement.  I advised her to continue levothyroxine 75 mcg p.o. every morning.   - We discussed about correct intake of levothyroxine, at fasting, with water, separated by at least 30 minutes from breakfast, and separated by more than 4 hours from calcium, iron, multivitamins, acid reflux medications (PPIs). -Patient is made aware of the fact that thyroid hormone replacement is needed for life, dose to be adjusted by periodic monitoring of thyroid function tests.  6) Chronic Care/Health Maintenance:  -she  is on ARB   is encouraged to continue to follow up with Ophthalmology,  Dentist,  Podiatrist at least yearly or according to recommendations, and advised to   stay away from smoking. I have recommended yearly flu vaccine and pneumonia vaccination at least every 5 years; moderate intensity exercise for up to 150 minutes weekly; and  sleep for at least 7 hours a day.  - I advised patient to maintain close follow up with Gareth Morgan, MD for primary care needs.  - Time spent with the patient: 25 min, of which >50% was spent in reviewing her blood glucose logs , discussing her hypo- and hyper-glycemic episodes, reviewing her current and  previous labs and insulin doses and developing a plan to avoid hypo- and hyper-glycemia. Please refer to Patient Instructions for Blood Glucose Monitoring and Insulin/Medications Dosing Guide"  in media tab for additional information. Jeb Levering participated in the discussions, expressed understanding, and voiced agreement with the above plans.  All questions were answered to her satisfaction. she is encouraged to contact clinic should she have any questions or concerns prior to her return visit.  Follow up plan: - Return in about 4 months (around 12/15/2017) for meter, and logs.  Marquis Lunch, MD  [facility] [address] [place], [place] [address] Phone: [phone]  Fax: [phone]    08/14/2017, 10:29 AM  This note was partially dictated with voice recognition software. Similar sounding words can be transcribed inadequately or may not  be corrected upon review.

## 2017-08-14 NOTE — Patient Instructions (Signed)

## 2017-08-15 ENCOUNTER — Institutional Professional Consult (permissible substitution) (INDEPENDENT_AMBULATORY_CARE_PROVIDER_SITE_OTHER): Payer: Self-pay | Admitting: Physical Medicine and Rehabilitation

## 2017-08-16 ENCOUNTER — Ambulatory Visit: Payer: Medicare Other | Admitting: "Endocrinology

## 2017-08-16 ENCOUNTER — Telehealth (INDEPENDENT_AMBULATORY_CARE_PROVIDER_SITE_OTHER): Payer: Self-pay | Admitting: Physical Medicine and Rehabilitation

## 2017-08-16 NOTE — Telephone Encounter (Signed)
Patient came in this morning, she thought her appointment was today. If you could call and reschedule her consult. Thank you. [phone]

## 2017-08-29 ENCOUNTER — Other Ambulatory Visit: Payer: Self-pay

## 2017-08-29 MED ORDER — LEVOTHYROXINE SODIUM 75 MCG PO TABS: 75.0000 ug | ORAL_TABLET | Freq: Every day | ORAL | 0 refills | Status: AC

## 2017-08-29 MED ORDER — LINAGLIPTIN 5 MG PO TABS: 5.0000 mg | ORAL_TABLET | Freq: Every day | ORAL | 0 refills | Status: DC

## 2017-09-05 ENCOUNTER — Institutional Professional Consult (permissible substitution) (INDEPENDENT_AMBULATORY_CARE_PROVIDER_SITE_OTHER): Payer: Self-pay | Admitting: Physical Medicine and Rehabilitation

## 2017-09-27 ENCOUNTER — Encounter (INDEPENDENT_AMBULATORY_CARE_PROVIDER_SITE_OTHER): Payer: Self-pay | Admitting: Physical Medicine and Rehabilitation

## 2017-09-27 ENCOUNTER — Ambulatory Visit (INDEPENDENT_AMBULATORY_CARE_PROVIDER_SITE_OTHER): Payer: Medicare Other | Admitting: Physical Medicine and Rehabilitation

## 2017-09-27 VITALS — BP 175/96 | HR 98 | Temp 98.3°F

## 2017-09-27 DIAGNOSIS — M25552 Pain in left hip: Secondary | ICD-10-CM | POA: Diagnosis not present

## 2017-09-27 DIAGNOSIS — Z9689 Presence of other specified functional implants: Secondary | ICD-10-CM

## 2017-09-27 DIAGNOSIS — M5416 Radiculopathy, lumbar region: Secondary | ICD-10-CM

## 2017-09-27 DIAGNOSIS — G894 Chronic pain syndrome: Secondary | ICD-10-CM

## 2017-09-27 NOTE — Progress Notes (Signed)
 .  Numeric Pain Rating Scale and Functional Assessment Average Pain 9 Pain Right Now 4 My pain is constant, sharp and aching Pain is worse with: walking and some activites Pain improves with: heat/ice   In the last MONTH (on 0-10 scale) has pain interfered with the following?  1. General activity like being  able to carry out your everyday physical activities such as walking, climbing stairs, carrying groceries, or moving a chair?  Rating(6)  2. Relation with others like being able to carry out your usual social activities and roles such as  activities at home, at work and in your community. Rating(2)  3. Enjoyment of life such that you have  been bothered by emotional problems such as feeling anxious, depressed or irritable?  Rating(1)

## 2017-09-28 ENCOUNTER — Encounter (INDEPENDENT_AMBULATORY_CARE_PROVIDER_SITE_OTHER): Payer: Self-pay | Admitting: Physical Medicine and Rehabilitation

## 2017-09-28 NOTE — Progress Notes (Signed)
[name] - 77 y.o. female MRN [id number]  Date of birth: 12/30/1940  Office Visit Note: Visit Date: 09/27/2017 PCP: Gareth Morgan, MD Referred by: Gareth Morgan, MD  Subjective: Chief Complaint  Patient presents with  . Left Leg - Pain   HPI: [name] is a very pleasant 77 year old female that comes in today at the request of Dr. Burnard Bunting for evaluation management of worsening left low back and hip and leg pain.  She reports that the pain started in 2018 after she slipped and her left foot slid forward and she had a fall.  She reports 9 out of 10 pain at times but today she is experiencing 4 out of 10 pain.  She relates this to be sharp and aching type pain worse with walking and better with heat and ice.  She has been followed from orthopedic standpoint by Dr. August Saucer.  She had a left total shoulder replacement performed by hand and is doing well with that.  She does have a prior history of right total hip replacement but not left.  When she failed conservative care he did end up getting an MRI of the pelvis and this is reviewed below.  She did have tear of the gluteus minimus and she had some greater trochanteric bursitis but no other issues.  She went on to have a CT lumbar myelogram which showed multilevel facet arthropathy particularly on the left at L4-5 with left paracentral and foraminal protrusion.  No focal nerve compression.  Foraminal narrowing.  She has had no focal weakness or symptoms past the knee.  Her symptoms are anterior lateral posterior lateral buttock to knee pain on the left.  She has had spinal cord stimulator implanted which per her record is a Press photographer.  She does not have the remote with her today.  It does not sound like she uses any programming.  She reports that this was placed by Dr. Delano Metz several years ago.  She has not seen them recently but has seen him in the past for pain complaints.  She reports that she needs bilateral knee  replacements and her knees are bothersome.  Her case is somewhat complicated by history of chronic pain syndrome and depression anxiety.   Review of Systems  Constitutional: Negative for chills, fever, malaise/fatigue and weight loss.  HENT: Negative for hearing loss and sinus pain.   Eyes: Negative for blurred vision, double vision and photophobia.  Respiratory: Negative for cough and shortness of breath.   Cardiovascular: Negative for chest pain, palpitations and leg swelling.  Gastrointestinal: Negative for abdominal pain, nausea and vomiting.  Genitourinary: Negative for flank pain.  Musculoskeletal: Positive for back pain and joint pain. Negative for myalgias.  Skin: Negative for itching and rash.  Neurological: Negative for tremors, focal weakness and weakness.  Endo/Heme/Allergies: Negative.   Psychiatric/Behavioral: Negative for depression.  All other systems reviewed and are negative.  Otherwise per HPI.  Assessment & Plan: Visit Diagnoses:  1. Lumbar radiculopathy   2. Chronic pain syndrome   3. Spinal cord stimulator status   4. Pain in left hip     Plan: Findings:  Chronic worsening left hip and leg pain from the buttock region to the knee posterior laterally really along the iliotibial band and this could be any number of things related to possibly facet joint arthritic referral pattern to L4 radicular problem to greater trochanteric bursitis.  No real excruciating pain on palpation today of the greater trochanter.  No pain with hip rotation.  She has prior right total hip arthroplasty.  She has prior spinal cord stimulator.  I think at this point after talking with her extensively I want her to go back to see Dr. Delano Metz for reprogramming of her spinal cord stimulator to see if that could provide coverage of this area.  There is no focal weakness or red flag complaints that are surgical or grossly orthopedic.  Depending on what he feels he or I could look at bursa  injection versus epidural injection.  She reports injections have not been beneficial much in the past.  She also has a card at home with her remote control for the [employer] spinal cord stimulator representative.  I have told her that I pretty exclusively use [employer] lately and do not know any of the [employer] representatives.  I would be happy if they needed to reprogram here here in the office to provide office space to do that.  For now she is going to contact the representative as well as Dr. Laban Emperor.    Meds & Orders: No orders of the defined types were placed in this encounter.   Orders Placed This Encounter  Procedures  . Ambulatory referral to Pain Clinic    Follow-up: Return if symptoms worsen or fail to improve.   Procedures: No procedures performed  No notes on file   Clinical History: LUMBAR MYELOGRAM  LUMBAR MYELOGRAM FINDINGS:  L5 is mildly transitional with an enlarged left-sided transverse process which demonstrates a pseudoarticulation with the sacrum. There is slight anterolisthesis of L2 on L3, L3 on L4, and L4 on L5, greatest at L3-4. These do not significantly worsen with flexion, and each appears to partially reduce with extension. Ventral extradural defects are present throughout the lumbar spine, most notable at L1-2 and L3-4. No high-grade spinal stenosis or nerve root sleeve cut off is identified. A spinal cord stimulator and hip arthroplasty are partially visualized.  CT LUMBAR MYELOGRAM FINDINGS:  Minimal anterolisthesis of L2 on L3, L3 on L4, and L4 on L5 is similar to the prior MRI. No fracture or suspicious osseous lesion is identified. A spinal cord stimulator enters the dorsal epidural space at the T12-L1 level and courses superiorly into the thoracic spine. The conus medullaris terminates at the upper L2 level. Aortoiliac atherosclerosis is noted without aneurysm.  T12-L1: Mild disc bulging and right greater than left  facet hypertrophy without stenosis.  L1-2: Circumferential disc bulging and mild facet hypertrophy result in minimal right neural foraminal stenosis without spinal stenosis, unchanged.  L2-3: Disc bulging, left foraminal and extraforaminal disc protrusion, and moderate facet arthrosis result in mild right and moderate left neural foraminal stenosis without spinal stenosis, unchanged. Disc could affect the exiting left L2 nerve root, although frank neural compression is not evident.  L3-4: Circumferential disc bulging and severe facet arthrosis result in moderate right greater than left neural foraminal stenosis. Left neural foraminal stenosis has mildly progressed, and disc could affect the left L3 nerve lateral to the foramen. No spinal stenosis.  L4-5: Disc bulging and severe right and moderate left facet arthrosis result in mild right and moderate left neural foraminal stenosis. Left neural foraminal stenosis has progressed due to increased leftward disc bulging/superimposed left foraminal disc protrusion and could affect the exiting left L4 nerve root though frank neural compression is not evident. No spinal stenosis.  L5-S1: Mild disc bulging, mild enlargement of a right foraminal and extraforaminal disc protrusion, and mild facet arthrosis  result in new mild right neural foraminal stenosis without spinal stenosis.  IMPRESSION: 1. Lumbar spondylosis and advanced facet arthrosis without spinal stenosis. 2. Moderate left neural foraminal stenosis at L3-4 and L4-5, both mildly progressed from 2013. 3. Unchanged moderate left neural foraminal stenosis at L2-3. 4. Aortic Atherosclerosis (ICD10-I70.0).   Electronically Signed By: Sebastian Ache M.D. On: 07/23/2017 12:07  MRI of the pelvis IMPRESSION: The exam is positive for left trochanteric bursitis with an associated complete tear of the left gluteus minimus from the greater trochanter.  Negative for hamstring  tear.  Mild appearing right hip osteoarthritis.  Status post right hip replacement. Small fluid collection superficial to right gluteal musculature is likely a postoperative seroma or possibly remote hematoma.  Sigmoid diverticulosis.   Electronically Signed   By: Drusilla Kanner M.D.   On: 04/20/2017 12:41   She reports that she has quit smoking. Her smoking use included cigarettes. She has never used smokeless tobacco.  Recent Labs    01/30/17 1647 05/09/17 1536 08/08/17 1202  HGBA1C 8.1* 6.6* 6.4*    Objective:  VS:  HT:    WT:   BMI:     BP:(!) 175/96  HR:98bpm  TEMP:98.3 F (36.8 C)(Oral)  RESP:94 % Physical Exam  Constitutional: She is oriented to person, place, and time. She appears well-developed and well-nourished. No distress.  Obese  HENT:  Head: Normocephalic and atraumatic.  Nose: Nose normal.  Mouth/Throat: Oropharynx is clear and moist.  Eyes: Pupils are equal, round, and reactive to light. Conjunctivae are normal.  Neck: Normal range of motion. Neck supple.  Cardiovascular: Regular rhythm and intact distal pulses.  Pulmonary/Chest: Effort normal. No respiratory distress.  Abdominal: She exhibits no distension. There is no guarding.  Musculoskeletal: She exhibits edema.  Patient ambulates with a cane.  She has pain over the bilateral greater trochanters but not exquisitely reproduced pain.  She has thickened body habitus in this area.  She has no pain with hip rotation on the left.  She has a negative slump test bilaterally and good distal strength without clonus.  She does have some pain with extension of the lumbar spine.  Neurological: She is alert and oriented to person, place, and time. She exhibits normal muscle tone. Coordination normal.  Skin: Skin is warm. No rash noted. No erythema.  Psychiatric: She has a normal mood and affect. Her behavior is normal.  Nursing note and vitals reviewed.   Ortho Exam Imaging: No results  found.  Past Medical/Family/Surgical/Social History: Medications & Allergies reviewed per EMR, new medications updated. Patient Active Problem List   Diagnosis Date Noted  . Hypothyroidism 02/27/2017  . AKI (acute kidney injury) (HCC) 02/01/2017  . Shoulder arthritis 01/30/2017  . Chronic knee pain (Bilateral) 08/09/2015  . Chronic pain 08/09/2015  . Spinal cord stimulator status 08/09/2015  . Primary osteoarthritis of knee (Bilateral) 02/23/2014  . Osteoarthritis of knee (Bilateral) 08/12/2013  . PVC's (premature ventricular contractions) 07/20/2010  . ANXIETY 06/18/2007  . Type 2 diabetes mellitus with stage 4 chronic kidney disease, without long-term current use of insulin (HCC) 05/13/2007  . OBESITY, MORBID 05/13/2007  . DEPRESSION 05/13/2007  . Essential hypertension, benign 05/13/2007  . ASTHMA 05/13/2007  . GERD 05/13/2007  . IBS 05/13/2007  . CKD (chronic kidney disease) stage 3, GFR 30-59 ml/min (HCC) 05/13/2007  . ARTHRITIS 05/13/2007  . SLEEP APNEA 05/13/2007   Past Medical History:  Diagnosis Date  . Anxiety   . Arthritis   . Asthma   .  Carotid artery aneurysm [facility](HCC)    Right s/p endovascular treatment 2004  . Carpal tunnel syndrome   . Cataract   . CKD (chronic kidney disease) stage 3, GFR 30-59 ml/min (HCC)   . Depression   . Essential hypertension, benign   . GERD (gastroesophageal reflux disease)   . Glaucoma   . History of recurrent UTIs   . Hx of colonic polyps   . Hx of migraines   . Hypothyroidism   . IBS (irritable bowel syndrome)   . Low back pain   . Morbid obesity (HCC)   . Neurogenic bladder   . Osteoporosis   . Pneumonia   . Sleep apnea    Intolerant to CPAP   . Stroke (HCC)   . Type 2 diabetes mellitus with diabetic neuropathy (HCC)    Family History  Problem Relation Age of Onset  . Heart disease Mother   . Cancer Mother   . Diabetes Brother   . Cancer Brother        Prostate   . Cancer Brother        Pancreatic   . Cancer  Sister        Breast   . Heart attack Sister   . Lung disease Unknown   . Asthma Unknown   . Colon cancer Neg Hx   . Colon polyps Neg Hx    Past Surgical History:  Procedure Laterality Date  . ABDOMINAL HYSTERECTOMY    . CARDIAC CATHETERIZATION     1980 or 1990  . Carpal tunnel release - bilateral  1992  . CATARACT EXTRACTION W/ INTRAOCULAR LENS IMPLANT Bilateral   . CEREBRAL ANEURYSM REPAIR  2004   Coil   . COLONOSCOPY N/A 03/04/2013   Dr. Darrick PennaFields: moderate diverticulosis, moderate internal hemorrhoids. Next colonoscopy in 5-10 years with history of simple adenomas.  . COLONOSCOPY  06/14/2006   ZOX:[id number]:SIMPLE ADENOMAS(2), pTICS,  lipoma  . ESOPHAGOGASTRODUODENOSCOPY  08/12/2007   WUJ:[id number]:Normal esophagus without evidence of Barrett's,mass, erosion ulceration or stricture.  The GE junction was at 40 cm.  The Bravo  capsule was placed at 34 cm/ Unable to appreciate a hiatal hernia in the retroflexed viewNormal duodenal bulb and second portion of the duodenum. mild gastritis.   Marland Kitchen. EYE SURGERY    . SHOULDER SURGERY     Rotator cuff repair, 2012  . SPINAL CORD STIMULATOR INSERTION    . Stomach surgery gastropexy for gastric volvulus  2009  . Total hip replacement - right  2002  . TOTAL SHOULDER ARTHROPLASTY Left 01/30/2017   Procedure: LEFT TOTAL SHOULDER ARTHROPLASTY;  Surgeon: Cammy Copaean, Gregory Scott, MD;  [facility];  Service: Orthopedics;  Laterality: Left;  . TUBAL LIGATION    . Tubular Adenoma    . VESICOVAGINAL FISTULA CLOSURE W/ TAH     Social History   Occupational History  . 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 Smoking status: Former Smoker    Types: Cigarettes  . Smokeless tobacco: Never Used  Substance and Sexual Activity  . Alcohol use: No  . Drug use: No  . Sexual activity: Not on file

## 2017-10-19 ENCOUNTER — Other Ambulatory Visit: Payer: Self-pay | Admitting: "Endocrinology

## 2017-10-23 ENCOUNTER — Encounter: Payer: Self-pay | Admitting: Nurse Practitioner

## 2017-10-23 ENCOUNTER — Ambulatory Visit: Payer: Medicare Other | Attending: Nurse Practitioner | Admitting: Nurse Practitioner

## 2017-10-23 ENCOUNTER — Other Ambulatory Visit
Admission: RE | Admit: 2017-10-23 | Discharge: 2017-10-23 | Disposition: A | Discharge status: Home / Self Care | Payer: Medicare Other | Source: Ambulatory Visit | Attending: Nurse Practitioner | Admitting: Nurse Practitioner

## 2017-10-23 ENCOUNTER — Other Ambulatory Visit: Payer: Self-pay

## 2017-10-23 DIAGNOSIS — M170 Bilateral primary osteoarthritis of knee: Secondary | ICD-10-CM | POA: Diagnosis not present

## 2017-10-23 DIAGNOSIS — F329 Major depressive disorder, single episode, unspecified: Secondary | ICD-10-CM | POA: Insufficient documentation

## 2017-10-23 DIAGNOSIS — I129 Hypertensive chronic kidney disease with stage 1 through stage 4 chronic kidney disease, or unspecified chronic kidney disease: Secondary | ICD-10-CM | POA: Insufficient documentation

## 2017-10-23 DIAGNOSIS — M13819 Other specified arthritis, unspecified shoulder: Secondary | ICD-10-CM | POA: Insufficient documentation

## 2017-10-23 DIAGNOSIS — N183 Chronic kidney disease, stage 3 (moderate): Secondary | ICD-10-CM | POA: Insufficient documentation

## 2017-10-23 DIAGNOSIS — M25551 Pain in right hip: Secondary | ICD-10-CM | POA: Diagnosis not present

## 2017-10-23 DIAGNOSIS — Z789 Other specified health status: Secondary | ICD-10-CM

## 2017-10-23 DIAGNOSIS — E1139 Type 2 diabetes mellitus with other diabetic ophthalmic complication: Secondary | ICD-10-CM | POA: Insufficient documentation

## 2017-10-23 DIAGNOSIS — M25552 Pain in left hip: Secondary | ICD-10-CM

## 2017-10-23 DIAGNOSIS — Z79899 Other long term (current) drug therapy: Secondary | ICD-10-CM

## 2017-10-23 DIAGNOSIS — M899 Disorder of bone, unspecified: Secondary | ICD-10-CM

## 2017-10-23 DIAGNOSIS — E1122 Type 2 diabetes mellitus with diabetic chronic kidney disease: Secondary | ICD-10-CM | POA: Insufficient documentation

## 2017-10-23 DIAGNOSIS — E1140 Type 2 diabetes mellitus with diabetic neuropathy, unspecified: Secondary | ICD-10-CM | POA: Diagnosis not present

## 2017-10-23 DIAGNOSIS — Z87891 Personal history of nicotine dependence: Secondary | ICD-10-CM | POA: Insufficient documentation

## 2017-10-23 DIAGNOSIS — K589 Irritable bowel syndrome without diarrhea: Secondary | ICD-10-CM | POA: Insufficient documentation

## 2017-10-23 DIAGNOSIS — E039 Hypothyroidism, unspecified: Secondary | ICD-10-CM | POA: Insufficient documentation

## 2017-10-23 DIAGNOSIS — K219 Gastro-esophageal reflux disease without esophagitis: Secondary | ICD-10-CM | POA: Insufficient documentation

## 2017-10-23 DIAGNOSIS — H42 Glaucoma in diseases classified elsewhere: Secondary | ICD-10-CM | POA: Insufficient documentation

## 2017-10-23 DIAGNOSIS — F419 Anxiety disorder, unspecified: Secondary | ICD-10-CM | POA: Insufficient documentation

## 2017-10-23 DIAGNOSIS — Z96612 Presence of left artificial shoulder joint: Secondary | ICD-10-CM | POA: Diagnosis not present

## 2017-10-23 DIAGNOSIS — Z7984 Long term (current) use of oral hypoglycemic drugs: Secondary | ICD-10-CM | POA: Insufficient documentation

## 2017-10-23 DIAGNOSIS — G894 Chronic pain syndrome: Secondary | ICD-10-CM | POA: Diagnosis not present

## 2017-10-23 DIAGNOSIS — J45909 Unspecified asthma, uncomplicated: Secondary | ICD-10-CM | POA: Diagnosis not present

## 2017-10-23 DIAGNOSIS — Z5181 Encounter for therapeutic drug level monitoring: Secondary | ICD-10-CM | POA: Diagnosis not present

## 2017-10-23 DIAGNOSIS — N319 Neuromuscular dysfunction of bladder, unspecified: Secondary | ICD-10-CM | POA: Diagnosis not present

## 2017-10-23 DIAGNOSIS — Z8249 Family history of ischemic heart disease and other diseases of the circulatory system: Secondary | ICD-10-CM | POA: Insufficient documentation

## 2017-10-23 DIAGNOSIS — H409 Unspecified glaucoma: Secondary | ICD-10-CM | POA: Insufficient documentation

## 2017-10-23 DIAGNOSIS — Z96641 Presence of right artificial hip joint: Secondary | ICD-10-CM | POA: Diagnosis not present

## 2017-10-23 DIAGNOSIS — Z79890 Hormone replacement therapy (postmenopausal): Secondary | ICD-10-CM | POA: Insufficient documentation

## 2017-10-23 DIAGNOSIS — Z8673 Personal history of transient ischemic attack (TIA), and cerebral infarction without residual deficits: Secondary | ICD-10-CM | POA: Insufficient documentation

## 2017-10-23 DIAGNOSIS — G8929 Other chronic pain: Secondary | ICD-10-CM

## 2017-10-23 DIAGNOSIS — M79605 Pain in left leg: Secondary | ICD-10-CM | POA: Insufficient documentation

## 2017-10-23 DIAGNOSIS — Z833 Family history of diabetes mellitus: Secondary | ICD-10-CM | POA: Diagnosis not present

## 2017-10-23 LAB — COMPREHENSIVE METABOLIC PANEL
ALBUMIN: 4 g/dL (ref 3.5–5.0)
ALT: 18 U/L (ref 0–44)
AST: 23 U/L (ref 15–41)
Alkaline Phosphatase: 98 U/L (ref 38–126)
Anion gap: 9 (ref 5–15)
BUN: 21 mg/dL (ref 8–23)
CHLORIDE: 108 mmol/L (ref 98–111)
CO2: 23 mmol/L (ref 22–32)
CREATININE: 1.25 mg/dL — AB (ref 0.44–1.00)
Calcium: 8.8 mg/dL — ABNORMAL LOW (ref 8.9–10.3)
GFR calc Af Amer: 46 mL/min — ABNORMAL LOW (ref >60)
GFR, EST NON AFRICAN AMERICAN: 40 mL/min — AB (ref >60)
GLUCOSE: 114 mg/dL — AB (ref 70–99)
POTASSIUM: 4 mmol/L (ref 3.5–5.1)
SODIUM: 140 mmol/L (ref 135–145)
Total Bilirubin: 0.4 mg/dL (ref 0.3–1.2)
Total Protein: 8.2 g/dL — ABNORMAL HIGH (ref 6.5–8.1)

## 2017-10-23 LAB — C-REACTIVE PROTEIN: CRP: 1.5 mg/dL — ABNORMAL HIGH

## 2017-10-23 LAB — VITAMIN B12: Vitamin B-12: 234 pg/mL (ref 180–914)

## 2017-10-23 LAB — MAGNESIUM: Magnesium: 2.2 mg/dL (ref 1.7–2.4)

## 2017-10-23 LAB — SEDIMENTATION RATE: Sed Rate: 45 mm/hr — ABNORMAL HIGH (ref 0–30)

## 2017-10-23 NOTE — Progress Notes (Addendum)
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 bulging and mild facet hypertrophy result in minimal right neural foraminal stenosis without spinal stenosis, unchanged.  L2-3: Disc bulging, left foraminal and extraforaminal disc protrusion, and moderate facet arthrosis result in mild right and moderate left neural foraminal stenosis without spinal stenosis, unchanged. Disc could affect the exiting left L2 nerve root, although frank neural compression is not evident.  L3-4: Circumferential disc bulging and severe facet arthrosis result in moderate right greater than left neural foraminal stenosis. Left neural foraminal stenosis has mildly progressed, and disc could affect the left L3 nerve lateral to the foramen. No spinal stenosis.  L4-5: Disc bulging and severe right and moderate left facet arthrosis result in mild right and moderate left neural foraminal stenosis. Left neural foraminal stenosis has progressed due to increased leftward disc bulging/superimposed left foraminal disc protrusion  and could affect the exiting left L4 nerve root though frank neural compression is not evident. No spinal stenosis.  L5-S1: Mild disc bulging, mild enlargement of a right foraminal and extraforaminal disc protrusion, and mild facet arthrosis result in new mild right neural foraminal stenosis without spinal stenosis.  IMPRESSION: 1. Lumbar spondylosis and advanced facet arthrosis without spinal stenosis. 2. Moderate left neural foraminal stenosis at L3-4 and L4-5, both mildly progressed from 2013. 3. Unchanged moderate left neural foraminal stenosis at L2-3. 4.  Aortic Atherosclerosis (ICD10-I70.0).   Electronically Signed   By: Logan Bores M.D.   On: 07/23/2017 12:07   [place] visit. Lumbar DG (Complete) 4+V:  Results for orders placed during the hospital encounter of 03/15/06  DG Lumbar Spine Complete   Narrative Clinical data: Back pain. Bilateral leg weakness and knee pain.   LUMBAR SPINE - 4 VIEW:  Comparison: 03/24/03.   Findings: Five lumbar vertebrae. Diffuse osteoporosis. No fracture or subluxation. No spondylolysis or bone destruction. Right hip prosthesis noted.   IMPRESSION:  Osteoporosis. No acute bony abnormalities. No significant interval change.  Provider: Briscoe Burns   Lumbar DG Myelogram Lumbosacral:  Results for orders placed during the hospital encounter of 07/23/17  DG MYELOGRAPHY LUMBAR INJ LUMBOSACRAL   Narrative CLINICAL DATA:  Posterior left leg pain. Low back pain improved after spinal stimulator placement.  EXAM: LUMBAR MYELOGRAM  FLUOROSCOPY TIME:  Radiation Exposure Index (as provided by the fluoroscopic  device): 221.06 microGray*m^2  Fluoroscopy Time (in minutes and seconds):  15 seconds  PROCEDURE: After thorough discussion of risks and benefits of the procedure including bleeding, infection, injury to nerves, blood vessels, adjacent structures as well as headache and CSF leak, written and oral informed consent was obtained. Consent was  obtained by Dr. Logan Bores. Time out form was completed.  Patient was positioned prone on the fluoroscopy table. Local anesthesia was provided with 1% lidocaine without epinephrine after prepped and draped in the usual sterile fashion. Puncture was performed at L3-4 using a 3 1/2 inch 22-gauge spinal needle via a right interlaminar approach. Using a single pass through the dura, the needle was placed within the thecal sac, with return of clear CSF. 15 mL of Isovue M-200 was injected into the thecal sac, with normal opacification of the nerve roots and cauda equina consistent with free flow within the subarachnoid space.  I personally performed the lumbar puncture and administered the intrathecal contrast. I also personally supervised acquisition of the myelogram images.  TECHNIQUE: Contiguous axial images were obtained through the Lumbar spine after the intrathecal infusion of infusion. Coronal and sagittal reconstructions were obtained of the axial image sets.  COMPARISON:  Lumbar MRI 05/17/2011  FINDINGS: LUMBAR MYELOGRAM FINDINGS:  L5 is mildly transitional with an enlarged left-sided transverse process which demonstrates a pseudoarticulation with the sacrum. There is slight anterolisthesis of L2 on L3, L3 on L4, and L4 on L5, greatest at L3-4. These do not significantly worsen with flexion, and each appears to partially reduce with extension. Ventral extradural defects are present throughout the lumbar spine, most notable at L1-2 and L3-4. No high-grade spinal stenosis or nerve root sleeve cut off is identified. A spinal cord stimulator and hip arthroplasty are partially visualized.  CT LUMBAR MYELOGRAM FINDINGS:  Minimal anterolisthesis of L2 on L3, L3 on L4, and L4 on L5 is similar to the prior MRI. No fracture or suspicious osseous lesion is identified. A spinal cord stimulator enters the dorsal epidural space at the T12-L1 level and courses superiorly into the  thoracic spine. The conus medullaris terminates at the upper L2 level. Aortoiliac atherosclerosis is noted without aneurysm.  T12-L1: Mild disc bulging and right greater than left facet hypertrophy without stenosis.  L1-2: Circumferential disc bulging and mild facet hypertrophy result in minimal right neural foraminal stenosis without spinal stenosis, unchanged.  L2-3: Disc bulging, left foraminal and extraforaminal disc protrusion, and moderate facet arthrosis result in mild right and moderate left neural foraminal stenosis without spinal stenosis, unchanged. Disc could affect the exiting left L2 nerve root, although frank neural compression is not evident.  L3-4: Circumferential disc bulging and severe facet arthrosis result in moderate right greater than left neural foraminal stenosis. Left neural foraminal stenosis has mildly progressed, and disc could affect the left L3 nerve lateral to the foramen. No spinal stenosis.  L4-5: Disc bulging and severe right and moderate left facet arthrosis result in mild right and moderate left neural foraminal stenosis. Left neural foraminal stenosis has progressed due to increased leftward disc bulging/superimposed left foraminal disc protrusion and could affect the exiting left L4 nerve root though frank neural compression is not evident. No spinal stenosis.  L5-S1: Mild disc bulging, mild enlargement of a right foraminal and extraforaminal disc protrusion, and mild facet arthrosis result in new mild right neural foraminal stenosis without spinal stenosis.  IMPRESSION: 1. Lumbar spondylosis and advanced facet arthrosis without spinal stenosis. 2. Moderate left neural foraminal stenosis at L3-4 and  L4-5, both mildly progressed from 2013. 3. Unchanged moderate left neural foraminal stenosis at L2-3. 4.  Aortic Atherosclerosis (ICD10-I70.0).   Electronically Signed   By: Logan Bores M.D.   On: 07/23/2017 12:07    Knee  Imaging:  Knee-R MR wo contrast:  Results for orders placed during the hospital encounter of 11/14/10  MR Knee Right Wo Contrast   Narrative *RADIOLOGY REPORT*  Clinical Data: Right knee pain and instability for several years. No recent injury or given history of prior relevant surgery.  MRI OF THE RIGHT KNEE WITHOUT CONTRAST  Technique:  Multiplanar, multisequence MR imaging was performed. No intravenous contrast was administered.  Comparison: Radiographs 03/15/2006 and MRI 05/12/2000.  Findings: Examination is mildly motion degraded despite repeating several sequences.  There is a small knee joint effusion and a small Baker's cyst.  The extensor mechanism is intact.  Patellar cartilage is mildly thinned at the apex without focal chondral defect.  Trochlear cartilage is preserved.  The weightbearing articular cartilage appears well preserved for age.  There is no evidence of fracture or osteochondral lesion. Both menisci appear normal.  The cruciate and collateral ligaments are intact.  IMPRESSION:  1.  Stable very mild degenerative changes for age.  No acute osteochondral findings. 2.  No evidence of meniscal or ligamentous tear. 3.  Small Baker's cyst.  Original Report Authenticated By: Vivia Ewing, M.D.  Knee-L DG 4 views:  Results for orders placed during the hospital encounter of 04/30/15  DG Knee Complete 4 Views Left   Narrative CLINICAL DATA:  Chronic posterior left knee pain without known injury; known arthritis in both knees.  EXAM: LEFT KNEE - COMPLETE 4+ VIEW  COMPARISON:  Left knee series of March 13, 2013  FINDINGS: The bones are adequately mineralized. There is mild narrowing of the lateral joint compartment. There is mild beaking of the medial tibial spine. The lateral and patellofemoral joint compartments are well maintained. There tiny spurs arising from the articular margins of the patella. No joint effusion is observed. There is no  acute fracture nor dislocation.  IMPRESSION: There are mild to moderate osteoarthritic changes of the left knee centered on the lateral and patellofemoral compartments. There is no acute bony abnormality.   Electronically Signed   By: David  [place] M.D.   On: 04/30/2015 15:25    Note: Available results from prior imaging studies were reviewed.        ROS  Cardiovascular History: High blood pressure Pulmonary or Respiratory History: Wheezing and difficulty taking a deep full breath (Asthma), Shortness of breath, Snoring  and Coughing up mucus (Bronchitis) Neurological History: Incontinence:  Urinary Review of Past Neurological Studies:  Results for orders placed or performed during the hospital encounter of 05/21/07  CT Head Wo Contrast   Narrative   History: Vomiting, diabetes, hypertension   CT HEAD WITHOUT CONTRAST:   Routine noncontrast CT head compared to 06/28/2004   Mild generalized atrophy. Normal ventricular morphology. No midline shift or mass-effect. Dense calcifications in basal ganglia and in central cerebellar hemispheres bilaterally. Metallic artifact at right skull base at expected position of proximal right middle cerebral artery, likely aneurysm coiling. Old right basal ganglia lacunar infarct with  extension of infarct into right frontoparietal periventricular white matter. No acute infarct, intracranial hemorrhage, or mass. Visualized paranasal sinuses and mastoid air cells clear. Bones unremarkable.   IMPRESSION: Status post coiling of right-sided aneurysm. Atrophy with old right-sided infarct as above. No acute intracranial abnormalities.  Provider: Gaetana Michaelis,  Amy Kathrynn Humble  Results for orders placed or performed during the hospital encounter of 05/07/07  MR Brain Wo Contrast   Narrative   Clinical Data: Patient with persistent headaches. 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 Complaining of nausea, difficulty walking,  and weakness.   MRI BRAIN WITHOUT CONTRAST:  Technique: Multiplanar and multiecho pulse sequences of the brain and surrounding structures were obtained according to standard protocol without IV contrast.  Comparison: The study was read in conjunction with the previous MRI scan of 05/17/05 and also the angiogram of 07/01/04.   Findings: The gray-white matter differentiation is normal. The sella is appropriate in signal and morphology. The clival signal is normal. The cerebellar tonsils are above the level of the foramen magnum. The odontoid process, the predental space, and the prevertebral soft tissues are also within normal limits for patient's age. No acute diffusion-weighted abnormalities are seen. Axial FLAIR and T2-weighted images demonstrate the presence of old ischemic infarct in the right centrum semiovale, unchanged. Nonspecific subcortical white matter supratentorially and in the subcortical white matter bifrontally. The corona radiata right greater than left, the centrum semiovale, and the bifrontal subcortical white matter remain stable. Mild prominence of the sulci overlying the cervical convexity is noted. The ventricles are at the upper limits of normal. The mastoids are well aerated. The internal auditory canals are symmetrical in signal and morphology. Flow voids are maintained in the major vessels at the cranial skull base. There is mild to moderate thickening of the mucosa in the ethmoid air cells and the frontal sinuses.   The visualized orbital contents are grossly normal.   No abnormal blood breakdown products seen on the SPGR sequences.   Bilateral physiologic mineralization of the basal ganglia, globus pallidi, the putamina, and also the dentate nuclei of the cerebellar hemispheres is noted.   IMPRESSION:  1. No evidence of acute ischemia.   2. 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   3. Probable small vessel type disease changes  supratentorially as described, probably related to chronic hypertension and/or diabetes with less likely possibility of a demyelinating process or vasculitis. This also appears stable. Mild mucosal thickening in the ethmoids and maxillary sinuses.   MR ANGIOGRAPHY OF HEAD:  Technique: 3-D time of flight pulse sequence was performed to examine the cerebral vasculature, centered at the circle of Willis, without IV contrast. Multiplanar MR image reconstructions were generated to evaluate the vascular anatomy.  Findings: The petrous, the cavernous, and the supraclinoid ICAs demonstrate adequate caliber and flow signal.   The right middle cerebral artery at its origin has a severe signal drop off. The right middle cerebral artery distal to this, the right anterior cerebral artery, the left middle cerebral artery, and the anterior cerebral arteries are otherwise of adequate caliber and flow signal. The MCA trifurcation branches are normal. The ACOM region is within normal limits also.   The vertebrobasilar junctions are patent with flow signal demonstrated in the right PICA. The basilar artery, the posterior cerebral arteries, the superior cerebellar arteries, and the anterior inferior cerebellar arteries demonstrate adequate caliber and flow signal.   Mild focal areas of caliber irregularity in the PCAs may be vessel tortuosity or arteriosclerotic changes.   IMPRESSION:  1. Severe signal drop off in the right middle cerebral artery at its origin. This may be related to the metallic signal artifacts caused by the previous endovascularly treated aneurysm. A focal area of severe stenosis, though possible, is  felt to be less likely.   2. Caliber irregularity of the PCAs, which may reflect mild arteriosclerotic changes versus vessel tortuosity.   3. Incidental note is made of bilaterally dominant PCOMs with a small infundibulum in the origin of the left PCOM, unchanged.   4. No evidence of recurrence of the  aneurysm in the right supraclinoid region is suggested.  Provider: Margaretmary Eddy  MR Angiogram Head Wo Contrast   Narrative   Clinical Data: Patient with persistent headaches. 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 Complaining of nausea, difficulty walking, and weakness.   MRI BRAIN WITHOUT CONTRAST:  Technique: Multiplanar and multiecho pulse sequences of the brain and surrounding structures were obtained according to standard protocol without IV contrast.  Comparison: The study was read in conjunction with the previous MRI scan of 05/17/05 and also the angiogram of 07/01/04.   Findings: The gray-white matter differentiation is normal. The sella is appropriate in signal and morphology. The clival signal is normal. The cerebellar tonsils are above the level of the foramen magnum. The odontoid process, the predental space, and the prevertebral soft tissues are also within normal limits for patient's age. No acute diffusion-weighted abnormalities are seen. Axial FLAIR and T2-weighted images demonstrate the presence of old ischemic infarct in the right centrum semiovale, unchanged. Nonspecific subcortical white matter supratentorially and in the subcortical white matter bifrontally. The corona radiata right greater than left, the centrum semiovale, and the bifrontal subcortical white matter remain stable. Mild prominence of the sulci overlying the cervical convexity is noted. The ventricles are at the upper limits of normal. The mastoids are well aerated. The internal auditory canals are symmetrical in signal and morphology. Flow voids are maintained in the major vessels at the cranial skull base. There is mild to moderate thickening of the mucosa in the ethmoid air cells and the frontal sinuses.   The visualized orbital contents are grossly normal.   No abnormal blood breakdown products seen on the SPGR sequences.   Bilateral physiologic mineralization of the basal ganglia, globus  pallidi, the putamina, and also the dentate nuclei of the cerebellar hemispheres is noted.   IMPRESSION:  1. No evidence of acute ischemia.   2. 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   3. Probable small vessel type disease changes supratentorially as described, probably related to chronic hypertension and/or diabetes with less likely possibility of a demyelinating process or vasculitis. This also appears stable. Mild mucosal thickening in the ethmoids and maxillary sinuses.   MR ANGIOGRAPHY OF HEAD:  Technique: 3-D time of flight pulse sequence was performed to examine the cerebral vasculature, centered at the circle of Willis, without IV contrast. Multiplanar MR image reconstructions were generated to evaluate the vascular anatomy.  Findings: The petrous, the cavernous, and the supraclinoid ICAs demonstrate adequate caliber and flow signal.   The right middle cerebral artery at its origin has a severe signal drop off. The right middle cerebral artery distal to this, the right anterior cerebral artery, the left middle cerebral artery, and the anterior cerebral arteries are otherwise of adequate caliber and flow signal. The MCA trifurcation branches are normal. The ACOM region is within normal limits also.   The vertebrobasilar junctions are patent with flow signal demonstrated in the right PICA. The basilar artery, the posterior cerebral arteries, the superior cerebellar arteries, and the anterior inferior cerebellar arteries demonstrate adequate caliber and flow signal.   Mild focal areas of caliber irregularity in  the PCAs may be vessel tortuosity or arteriosclerotic changes.   IMPRESSION:  1. Severe signal drop off in the right middle cerebral artery at its origin. This may be related to the metallic signal artifacts caused by the previous endovascularly treated aneurysm. A focal area of severe stenosis, though possible, is felt to be less likely.   2.  Caliber irregularity of the PCAs, which may reflect mild arteriosclerotic changes versus vessel tortuosity.   3. Incidental note is made of bilaterally dominant PCOMs with a small infundibulum in the origin of the left PCOM, unchanged.   4. No evidence of recurrence of the aneurysm in the right supraclinoid region is suggested.  Provider: Margaretmary Eddy  Results for orders placed or performed during the hospital encounter of 05/17/05  MR Brain W Wo Contrast   Narrative   Clinical Data:  one week of right sided headache. MRI BRAIN WITHOUT AND WITH CONTRAST: Technique:  Multiplanar and multiecho pulse sequences of the brain and surrounding structures were obtained according to standard protocol before and after administration of intravenous contrast. Contrast:  20 cc Magnevist. Comparison:  Previous arteriogram 07/01/04.  CT head 06/28/04. Findings:  Sagittal images unremarkable.  Diffusion images are negative for acute stroke but are distorted by magnetic susceptibility artifact.  T2-weighted images show mild atrophy premature for the patient's age of 60.  There is a large area of remote infarction in the right basal ganglia.  FLAIR images show chronic microvascular ischemic change throughout the periventricular and subcortical white matter.  Coronal T2-weighted images highlight the remote infarct and small vessel disease.  T1-weighted images show encephalomalacia and prominent perivascular spaces.  Post-infusion, there is no abnormal enhancement of the brain or meninges.  No significant changes of sinusitis are seen.  Although MR is not sensitive in the detection of subarachnoid blood I do not see obvious signs of parenchymal clot or subarachnoid blood in the basilar cisterns.  IMPRESSION: 1.  Atrophy and chronic microvascular ischemic change.  2.  Remote right basal ganglia and centrum semiovale infarct. 3.  No acute stroke.  4.  No abnormal intracranial enhancement. 5.  No gross MR evidence for  subarachnoid hemorrhage; note that MR is insensitive in the detection of  acute subarachnoid blood and that CT is the examination of choice for the question of acute SAH.  MR ANGIOGRAPHY OF HEAD WITHOUT CONTRAST: Technique:  3-D time of flight pulse sequence was performed to examine the cerebral vasculature, centered at the circle of Willis, without IV contrast.  Multiplanar MR image reconstructions were generated to evaluate the vascular anatomy. Comparison:  Previous angiography 2006. Findings:  Mild non-stenotic atherosclerotic change carotid siphons and proximal MCA, PCA, ACA regions.  No new aneurysms detected.  Mild signal dropout in the region of the right ICA terminus related to the coil mass with magnetic susceptibility artifact.  Basilar artery and both vertebrals widely patent without significant pathology.  IMPRESSION: 1.  No new areas of aneurysmal dilatation and no evidence for regrowth of the previously coiled aneurysm. 2.  Mild intracranial atherosclerotic change as described.  Provider: Juanda Chance   Psychological-Psychiatric History: Anxiousness, Depressed, Prone to panicking, Suicidal ideations and Attempted suicide Gastrointestinal History: Heartburn due to stomach pushing into lungs (Hiatal hernia), Reflux or heatburn and Alternating episodes iof diarrhea and constipation (IBS-Irritable bowe syndrome) Genitourinary History: Kidney disease and Recurrent Urinary Tract infections Hematological History: Brusing easily and Bleeding easily Endocrine History: High blood sugar requiring insulin (IDDM) and High blood sugar controlled without the use  of insulin (NIDDM) Rheumatologic History: Joint aches and or swelling due to excess weight (Osteoarthritis) and Constant unexplained fatigue (Chronic Fatigue Syndrome) Musculoskeletal History: Negative for myasthenia gravis, muscular dystrophy, multiple sclerosis or malignant hyperthermia Work History: Retired  Allergies  Ms. Emile  is allergic to oxycodone; codeine; hydrocodone; and naproxen.  Laboratory Chemistry  Inflammation Markers Lab Results  Component Value Date   CRP 1.5 (H) 10/23/2017   ESRSEDRATE 45 (H) 10/23/2017   (CRP: Acute Phase) (ESR: Chronic Phase) Renal Function Markers Lab Results  Component Value Date   BUN 21 10/23/2017   CREATININE 1.25 (H) 10/23/2017   GFRAA 46 (L) 10/23/2017   GFRNONAA 40 (L) 10/23/2017   Hepatic Function Markers Lab Results  Component Value Date   AST 23 10/23/2017   ALT 18 10/23/2017   ALBUMIN 4.0 10/23/2017   ALKPHOS 98 10/23/2017   HCVAB NEG 02/15/2010   Electrolytes Lab Results  Component Value Date   NA 140 10/23/2017   K 4.0 10/23/2017   CL 108 10/23/2017   CALCIUM 8.8 (L) 10/23/2017   MG 2.2 10/23/2017   Neuropathy Markers Lab Results  Component Value Date   VITAMINB12 234 10/23/2017   Bone Pathology Markers Lab Results  Component Value Date   ALKPHOS 98 10/23/2017   VD25OH 24 (L) 05/09/2017   CALCIUM 8.8 (L) 10/23/2017   Coagulation Parameters Lab Results  Component Value Date   INR 1.01 07/19/2010   LABPROT 13.5 07/19/2010   APTT 29 07/19/2010   PLT 342 02/14/2017   Cardiovascular Markers Lab Results  Component Value Date   BNP 53.0 02/20/2015   HGB 10.4 (L) 02/14/2017   HCT 33.5 (L) 02/14/2017   Note: Lab results reviewed.  [place]  Drug: Ms. Demello  reports that she does not use drugs. Alcohol:  reports that she does not drink alcohol. Tobacco:  reports that she has quit smoking. Her smoking use included cigarettes. She has never used smokeless tobacco. Medical:  has a past medical history of Anxiety, Arthritis, Asthma, Carotid artery aneurysm ([place]), Carpal tunnel syndrome, Cataract, CKD (chronic kidney disease) stage 3, GFR 30-59 ml/min ([place]), Depression, Essential hypertension, benign, GERD (gastroesophageal reflux disease), Glaucoma, History of recurrent UTIs, colonic polyps, migraines, Hypothyroidism, IBS (irritable  bowel syndrome), Low back pain, Morbid obesity ([place]), Neurogenic bladder, Osteoporosis, Pneumonia, Sleep apnea, Stroke ([place]), and Type 2 diabetes mellitus with diabetic neuropathy ([place]). Family: family history includes Asthma in her unknown relative; Cancer in her brother, brother, mother, and sister; Diabetes in her brother; Heart attack in her sister; Heart disease in her mother; Lung disease in her unknown relative.  Past Surgical History:  Procedure Laterality Date  . ABDOMINAL HYSTERECTOMY    . CARDIAC CATHETERIZATION     1980 or 1990  . Carpal tunnel release - bilateral  1992  . CATARACT EXTRACTION W/ INTRAOCULAR LENS IMPLANT Bilateral   . CEREBRAL ANEURYSM REPAIR  2004   Coil   . COLONOSCOPY N/A 03/04/2013   Dr. Oneida Alar: moderate diverticulosis, moderate internal hemorrhoids. Next colonoscopy in 5-10 years with history of simple adenomas.  . COLONOSCOPY  06/14/2006   HYQ:MVHQIO ADENOMAS(2), pTICS, [place] lipoma  . ESOPHAGOGASTRODUODENOSCOPY  08/12/2007   NGE:XBMWUX esophagus without evidence of Barrett's,mass, erosion ulceration or stricture.  The GE junction was at 40 cm.  The Bravo  capsule was placed at 34 cm/ Unable to appreciate a hiatal hernia in the retroflexed viewNormal duodenal bulb and second portion of the duodenum. mild gastritis.   Marland Kitchen EYE SURGERY    .  SHOULDER SURGERY     Rotator cuff repair, 2012  . SPINAL CORD STIMULATOR INSERTION    . Stomach surgery gastropexy for gastric volvulus  2009  . Total hip replacement - right  2002  . TOTAL SHOULDER ARTHROPLASTY Left 01/30/2017   Procedure: LEFT TOTAL SHOULDER ARTHROPLASTY;  Surgeon: Meredith Pel, MD;  Location: [facility];  Service: Orthopedics;  Laterality: Left;  . TUBAL LIGATION    . Tubular Adenoma    . VESICOVAGINAL FISTULA CLOSURE W/ TAH     Active Ambulatory Problems    Diagnosis Date Noted  . Type II or unspecified type diabetes mellitus without mention of complication, not stated as uncontrolled 05/13/2007  .  OBESITY, MORBID 05/13/2007  . Anxiety state 06/18/2007  . Depression 05/13/2007  . Benign essential hypertension 05/13/2007  . ASTHMA 05/13/2007  . GERD 05/13/2007  . IBS 05/13/2007  . CKD (chronic kidney disease) stage 3, GFR 30-59 ml/min (HCC) 05/13/2007  . ARTHRITIS 05/13/2007  . Abdominal pain 05/13/2007  . Asthma 05/13/2007  . PVC's (premature ventricular contractions) 07/20/2010  . Osteoarthritis of knee (Bilateral) 08/12/2013  . Primary osteoarthritis of knee (Bilateral) 02/23/2014  . Chronic knee pain (Bilateral) 08/09/2015  . Chronic pain 08/09/2015  . Spinal cord stimulator status 08/09/2015  . Shoulder arthritis 01/30/2017  . AKI (acute kidney injury) ([place]) 02/01/2017  . Hypothyroidism 02/27/2017  . Arthropathy 05/13/2007  . Chronic pain of left lower extremity (Primary Area of Pain) 10/23/2017  . Chronic pain of both hips (Secondary Area of Pain) (R>L) 10/23/2017  . Chronic pain syndrome 10/23/2017  . Pharmacologic therapy 10/23/2017  . Disorder of skeletal system 10/23/2017  . Problems influencing health status 10/23/2017   Resolved Ambulatory Problems    Diagnosis Date Noted  . ASTHMA, WITH ACUTE EXACERBATION 06/26/2008  . OVERACTIVE BLADDER 05/13/2007  . UTI'S, RECURRENT 05/13/2007  . DEGENERATIVE JOINT DISEASE, KNEE 02/24/2009  . SHOULDER PAIN, RIGHT 09/07/2008  . DEGENERATIVE DISC DISEASE, LUMBOSACRAL SPINE W/RADICULOPATHY 02/24/2009  . LEG PAIN, RIGHT 09/07/2008  . INSOMNIA 06/23/2008  . Edema 07/05/2007  . Headache(784.0) 06/23/2008  . Nausea with vomiting 07/23/2008  . URINARY INCONTINENCE 05/13/2007  . TRANSAMINASES, SERUM, ELEVATED 02/09/2010  . CATARACT, HX OF 05/13/2007  . CARPAL TUNNEL SYNDROME, HX OF 05/13/2007  . MIGRAINES, HX OF 05/13/2007  . RUPTURE ROTATOR CUFF 03/23/2010  . Preoperative evaluation to rule out surgical contraindication 07/20/2010  . Rotator cuff tear, right 01/04/2011  . Personal history of colonic polyps 02/21/2013  .  Knee pain 03/13/2013  . Shortness of breath 07/23/2013   Past Medical History:  Diagnosis Date  . Anxiety   . Arthritis   . Asthma   . 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 Carpal tunnel syndrome   . Cataract   . CKD (chronic kidney disease) stage 3, GFR 30-59 ml/min (HCC)   . Depression   . Essential hypertension, benign   . GERD (gastroesophageal reflux disease)   . Glaucoma   . History of recurrent UTIs   . Hx of colonic polyps   . Hx of migraines   . Hypothyroidism   . IBS (irritable bowel syndrome)   . Low back pain   . Morbid obesity ([place])   . Neurogenic bladder   . Osteoporosis   . Pneumonia   . Sleep apnea   . Stroke ([place])   . Type 2 diabetes mellitus with diabetic neuropathy ([place])    Constitutional Exam  General appearance: Well nourished, well developed, and well hydrated. In  no apparent acute distress Vitals:   10/23/17 1150  BP: (!) 141/78  Pulse: 80  Resp: 16  Temp: 97.8 F (36.6 C)  SpO2: 98%  Weight: 213 lb (96.6 kg)  Height: 5' (1.524 m)   BMI Assessment: Estimated body mass index is 41.6 kg/m as calculated from the following:   Height as of this encounter: 5' (1.524 m).   Weight as of this encounter: 213 lb (96.6 kg).  BMI interpretation table: BMI level Category Range association with higher incidence of chronic pain  <18 kg/m2 Underweight   18.5-24.9 kg/m2 Ideal body weight   25-29.9 kg/m2 Overweight Increased incidence by 20%  30-34.9 kg/m2 Obese (Class I) Increased incidence by 68%  35-39.9 kg/m2 Severe obesity (Class II) Increased incidence by 136%  >40 kg/m2 Extreme obesity (Class III) Increased incidence by 254%   BMI Readings from Last 4 Encounters:  10/23/17 41.60 kg/m  08/14/17 40.82 kg/m  05/15/17 40.62 kg/m  04/09/17 40.43 kg/m   Wt Readings from Last 4 Encounters:  10/23/17 213 lb (96.6 kg)  08/14/17 209 lb (94.8 kg)  05/15/17 208 lb (94.3 kg)  04/09/17 207 lb (93.9 kg)  Psych/Mental status: Alert, oriented x 3 (person,  place, & time)       Eyes: PERLA Respiratory: No evidence of acute respiratory distress  Cervical Spine Exam  Inspection: No masses, redness, or swelling Alignment: Symmetrical Functional ROM: Unrestricted ROM      Stability: No instability detected Muscle strength & Tone: Functionally intact Sensory: Unimpaired Palpation: No palpable anomalies              Upper Extremity (UE) Exam    Side: Right upper extremity  Side: Left upper extremity  Inspection: No masses, redness, swelling, or asymmetry. No contractures  Inspection: No masses, redness, swelling, or asymmetry. No contractures  Functional ROM: Unrestricted ROM          Functional ROM: Unrestricted ROM          Muscle strength & Tone: Functionally intact  Muscle strength & Tone: Functionally intact  Sensory: Unimpaired  Sensory: Unimpaired  Palpation: No palpable anomalies              Palpation: No palpable anomalies              Specialized Test(s): Deferred         Specialized Test(s): Deferred          Thoracic Spine Exam  Inspection: No masses, redness, or swelling Alignment: Symmetrical Functional ROM: Unrestricted ROM Stability: No instability detected Sensory: Unimpaired Muscle strength & Tone: No palpable anomalies  Lumbar Spine Exam  Inspection: No masses, redness, or swelling Alignment: Symmetrical Functional ROM: Unrestricted ROM      Stability: No instability detected Muscle strength & Tone: Functionally intact Sensory: Unimpaired Palpation: No palpable anomalies       Provocative Tests: Lumbar Hyperextension and rotation test: evaluation deferred today       Patrick's Maneuver: Negative                    Gait & Posture Assessment  Ambulation: Patient came in today in a wheel chair Gait: Antalgic Posture: WNL   Lower Extremity Exam    Side: Right lower extremity  Side: Left lower extremity  Inspection: No masses, redness, swelling, or asymmetry. No contractures  Inspection: No masses, redness,  swelling, or asymmetry. No contractures  Functional ROM: Adequate ROM for hip and knee joints  Functional ROM: Adequate ROM for  hip and knee joints  Muscle strength & Tone: Functionally intact  Muscle strength & Tone: Guarding  Sensory: Unimpaired  Sensory: Unimpaired  Palpation: Tender  Palpation: Complains of area being tender to palpation   Assessment  Primary Diagnosis & Pertinent Problem List: Diagnoses of Chronic pain of left lower extremity (Primary Area of Pain), Chronic pain of both hips (Secondary Area of Pain) (R>L), Chronic pain syndrome, Pharmacologic therapy, Disorder of skeletal system, and Problems influencing health status were pertinent to this visit.  Visit Diagnosis: 1. Chronic pain of left lower extremity (Primary Area of Pain)   2. Chronic pain of both hips (Secondary Area of Pain) (R>L)   3. Chronic pain syndrome   4. Pharmacologic therapy   5. Disorder of skeletal system   6. Problems influencing health status    Plan of Care  Initial treatment plan:  Please be advised that as per protocol, today's visit has been an evaluation only. We have not taken over the patient's controlled substance management.  Problem-specific plan: No problem-specific Assessment & Plan notes found for this encounter.  Ordered Lab-work, Procedure(s), Referral(s), & Consult(s): Orders Placed This Encounter  Procedures  . DG HIP UNILAT W OR W/O PELVIS 2-3 VIEWS LEFT  . DG HIP UNILAT W OR W/O PELVIS 2-3 VIEWS RIGHT  . DG Knee 1-2 Views Left  . Compliance Drug Analysis, Ur  . Comp. Metabolic Panel (12)  . Magnesium  . Vitamin B12  . Sedimentation rate  . C-reactive protein   Pharmacotherapy: Medications ordered:  No orders of the defined types were placed in this encounter.  Medications administered during this visit: Bobie H. Aldredge had no medications administered during this visit.   Pharmacotherapy under consideration:  Opioid Analgesics: The patient was informed that  there is no guarantee that she would be a candidate for opioid analgesics. The decision will be made following CDC guidelines. This decision will be based on the results of diagnostic studies, as well as Ms. Provencal's risk profile.  Membrane stabilizer: To be determined at a later time Muscle relaxant: To be determined at a later time NSAID: To be determined at a later time Other analgesic(s): To be determined at a later time   Interventional therapies under consideration: Ms. Brandstetter was informed that there is no guarantee that she would be a candidate for interventional therapies. The decision will be based on the results of diagnostic studies, as well as Ms. Neace's risk profile.  Possible procedure(s): Diagnostic left transforaminal epidural steroid injection Diagnostic bilateral lumbar facet nerve block Possible bilateral lumbar facet radiofrequency ablation Diagnostic left intra-articular hip injection Diagnostic right foraminal femoral and obturator nerve block Diagnostic bilateral intra-articular knee injection Diagnostic bilateral Hyalgan series   Provider-requested follow-up: Return for 2nd Visit, w/ Dr. Dossie Arbour.  Future Appointments  Date Time Provider [place]  11/26/2017  1:00 PM Milinda Pointer, MD [facility] None  12/17/2017 10:30 AM Nida, Marella Chimes, MD [facility] None    Primary Care Physician: Lemmie Evens, MD Location: [facility] Note by:  Date: 10/23/2017; Time: 3:22 PM  Pain Score Disclaimer: We use the NRS-11 scale. This is a self-reported, subjective measurement of pain severity with only modest accuracy. It is used primarily to identify changes within a particular patient. It must be understood that outpatient pain scales are significantly less accurate that those used for research, where they can be applied under ideal controlled circumstances with minimal exposure to variables. In reality, the score is likely to  be  a combination of pain intensity and pain affect, where pain affect describes the degree of emotional arousal or changes in action readiness caused by the sensory experience of pain. Factors such as social and work situation, setting, emotional state, anxiety levels, expectation, and prior pain experience may influence pain perception and show large inter-individual differences that may also be affected by time variables.  Patient instructions provided during this appointment: Patient Instructions   ____________________________________________________________________________________________  Appointment Policy Summary  It is our goal and responsibility to provide the medical community with assistance in the evaluation and management of patients with chronic pain. Unfortunately our resources are limited. Because we do not have an unlimited amount of time, or available appointments, we are required to closely monitor and manage their use. The following rules exist to maximize their use:  Patient's responsibilities: 1. Punctuality:  At what time should I arrive? You should be physically present in our office 30 minutes before your scheduled appointment. Your scheduled appointment is with your assigned healthcare provider. However, it takes 5-10 minutes to be "checked-in", and another 15 minutes for the nurses to do the admission. If you arrive to our office at the time you were given for your appointment, you will end up being at least 20-25 minutes late to your appointment with the provider. 2. Tardiness:  What happens if I arrive only a few minutes after my scheduled appointment time? You will need to reschedule your appointment. The cutoff is your appointment time. This is why it is so important that you arrive at least 30 minutes before that appointment. If you have an appointment scheduled for 10:00 AM and you arrive at 10:01, you will be required to reschedule your appointment.  3. Plan ahead:   Always assume that you will encounter traffic on your way in. Plan for it. If you are dependent on a driver, make sure they understand these rules and the need to arrive early. 4. Other appointments and responsibilities:  Avoid scheduling any other appointments before or after your pain clinic appointments.  5. Be prepared:  Write down everything that you need to discuss with your healthcare provider and give this information to the admitting nurse. Write down the medications that you will need refilled. Bring your pills and bottles (even the empty ones), to all of your appointments, except for those where a procedure is scheduled. 6. No children or pets:  Find someone to take care of them. It is not appropriate to bring them in. 7. Scheduling changes:  We request "advanced notification" of any changes or cancellations. 8. Advanced notification:  Defined as a time period of more than 24 hours prior to the originally scheduled appointment. This allows for the appointment to be offered to other patients. 9. Rescheduling:  When a visit is rescheduled, it will require the cancellation of the original appointment. For this reason they both fall within the category of "Cancellations".  10. Cancellations:  They require advanced notification. Any cancellation less than 24 hours before the  appointment will be recorded as a "No Show". 11. No Show:  Defined as an unkept appointment where the patient failed to notify or declare to the practice their intention or inability to keep the appointment.  Corrective process for repeat offenders:  1. Tardiness: Three (3) episodes of rescheduling due to late arrivals will be recorded as one (1) "No Show". 2. Cancellation or reschedule: Three (3) cancellations or rescheduling will be recorded as one (1) "No Show". 3. "No Shows": Three (3) "  No Shows" within a 12 month period will result in discharge from the  practice. ____________________________________________________________________________________________  ____________________________________________________________________________________________  Pain Scale  Introduction: The pain score used by this practice is the Verbal Numerical Rating Scale (VNRS-11). This is an 11-point scale. It is for adults and children 10 years or older. There are significant differences in how the pain score is reported, used, and applied. Forget everything you learned in the past and learn this scoring system.  General Information: The scale should reflect your current level of pain. Unless you are specifically asked for the level of your worst pain, or your average pain. If you are asked for one of these two, then it should be understood that it is over the past 24 hours.  Basic Activities of Daily Living (ADL): Personal hygiene, dressing, eating, transferring, and using restroom.  Instructions: Most patients tend to report their level of pain as a combination of two factors, their physical pain and their psychosocial pain. This last one is also known as "suffering" and it is reflection of how physical pain affects you socially and psychologically. From now on, report them separately. From this point on, when asked to report your pain level, report only your physical pain. Use the following table for reference.  Pain Clinic Pain Levels (0-5/10)  Pain Level Score  Description  No Pain 0   Mild pain 1 Nagging, annoying, but does not interfere with basic activities of daily living (ADL). Patients are able to eat, bathe, get dressed, toileting (being able to get on and off the toilet and perform personal hygiene functions), transfer (move in and out of bed or a chair without assistance), and maintain continence (able to control bladder and bowel functions). Blood pressure and heart rate are unaffected. A normal heart rate for a healthy adult ranges from 60 to 100 bpm  (beats per minute).   Mild to moderate pain 2 Noticeable and distracting. Impossible to hide from other people. More frequent flare-ups. Still possible to adapt and function close to normal. It can be very annoying and may have occasional stronger flare-ups. With discipline, patients may get used to it and adapt.   Moderate pain 3 Interferes significantly with activities of daily living (ADL). It becomes difficult to feed, bathe, get dressed, get on and off the toilet or to perform personal hygiene functions. Difficult to get in and out of bed or a chair without assistance. Very distracting. With effort, it can be ignored when deeply involved in activities.   Moderately severe pain 4 Impossible to ignore for more than a few minutes. With effort, patients may still be able to manage work or participate in some social activities. Very difficult to concentrate. Signs of autonomic nervous system discharge are evident: dilated pupils (mydriasis); mild sweating (diaphoresis); sleep interference. Heart rate becomes elevated (>115 bpm). Diastolic blood pressure (lower number) rises above 100 mmHg. Patients find relief in laying down and not moving.   Severe pain 5 Intense and extremely unpleasant. Associated with frowning face and frequent crying. Pain overwhelms the senses.  Ability to do any activity or maintain social relationships becomes significantly limited. Conversation becomes difficult. Pacing back and forth is common, as getting into a comfortable position is nearly impossible. Pain wakes you up from deep sleep. Physical signs will be obvious: pupillary dilation; increased sweating; goosebumps; brisk reflexes; cold, clammy hands and feet; nausea, vomiting or dry heaves; loss of appetite; significant sleep disturbance with inability to fall asleep or to remain asleep. When persistent,  significant weight loss is observed due to the complete loss of appetite and sleep deprivation.  Blood pressure and heart  rate becomes significantly elevated. Caution: If elevated blood pressure triggers a pounding headache associated with blurred vision, then the patient should immediately seek attention at an urgent or emergency care unit, as these may be signs of an impending stroke.    Emergency Department Pain Levels (6-10/10)  Emergency Room Pain 6 Severely limiting. Requires emergency care and should not be seen or managed at an outpatient pain management facility. Communication becomes difficult and requires great effort. Assistance to reach the emergency department may be required. Facial flushing and profuse sweating along with potentially dangerous increases in heart rate and blood pressure will be evident.   Distressing pain 7 Self-care is very difficult. Assistance is required to transport, or use restroom. Assistance to reach the emergency department will be required. Tasks requiring coordination, such as bathing and getting dressed become very difficult.   Disabling pain 8 Self-care is no longer possible. At this level, pain is disabling. The individual is unable to do even the most "basic" activities such as walking, eating, bathing, dressing, transferring to a bed, or toileting. Fine motor skills are lost. It is difficult to think clearly.   Incapacitating pain 9 Pain becomes incapacitating. Thought processing is no longer possible. Difficult to remember your own name. Control of movement and coordination are lost.   The worst pain imaginable 10 At this level, most patients pass out from pain. When this level is reached, collapse of the autonomic nervous system occurs, leading to a sudden drop in blood pressure and heart rate. This in turn results in a temporary and dramatic drop in blood flow to the brain, leading to a loss of consciousness. Fainting is one of the body's self defense mechanisms. Passing out puts the brain in a calmed state and causes it to shut down for a while, in order to begin the  healing process.    Summary: 1. Refer to this scale when providing [place] with your pain level. 2. Be accurate and careful when reporting your pain level. This will help with your care. 3. Over-reporting your pain level will lead to loss of credibility. 4. Even a level of 1/10 means that there is pain and will be treated at our facility. 5. High, inaccurate reporting will be documented as "Symptom Exaggeration", leading to loss of credibility and suspicions of possible secondary gains such as obtaining more narcotics, or wanting to appear disabled, for fraudulent reasons. 6. Only pain levels of 5 or below will be seen at our facility. 7. Pain levels of 6 and above will be sent to the Emergency Department and the appointment cancelled. ____________________________________________________________________________________________

## 2017-10-23 NOTE — Patient Instructions (Signed)
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
## 2017-10-24 ENCOUNTER — Encounter: Payer: Self-pay | Admitting: Nurse Practitioner

## 2017-10-26 LAB — COMPLIANCE DRUG ANALYSIS, UR

## 2017-11-25 NOTE — Progress Notes (Signed)
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 exiting left L2 nerve root, although frank neural compression is not evident.  L3-4: Circumferential disc bulging and severe facet arthrosis result in moderate right greater than left  neural foraminal stenosis. Left neural foraminal stenosis has mildly progressed, and disc could affect the left L3 nerve lateral to the foramen. No spinal stenosis.  L4-5: Disc bulging and severe right and moderate left facet arthrosis result in mild right and moderate left neural foraminal stenosis. Left neural foraminal stenosis has progressed due to increased leftward disc bulging/superimposed left foraminal disc protrusion and could affect the exiting left L4 nerve root though frank neural compression is not evident. No spinal stenosis.  L5-S1: Mild disc bulging, mild enlargement of a right foraminal and extraforaminal disc protrusion, and mild facet arthrosis  result in new mild right neural foraminal stenosis without spinal stenosis.  IMPRESSION: 1. Lumbar spondylosis and advanced facet arthrosis without spinal stenosis. 2. Moderate left neural foraminal stenosis at L3-4 and L4-5, both mildly progressed from 2013. 3. Unchanged moderate left neural foraminal stenosis at L2-3. 4.  Aortic Atherosclerosis (ICD10-I70.0).   Electronically Signed   By: Logan Bores M.D.   On: 07/23/2017 12:07    Lumbar DG (Complete) 4+V:  Results for orders placed during the hospital encounter of 03/15/06  DG Lumbar Spine Complete   Narrative Clinical data: Back pain. Bilateral leg weakness and knee pain.   LUMBAR SPINE - 4 VIEW:  Comparison: 03/24/03.   Findings: Five lumbar vertebrae. Diffuse osteoporosis. No fracture or subluxation. No spondylolysis or bone destruction. Right hip prosthesis noted.   IMPRESSION:  Osteoporosis. No acute bony abnormalities. No significant interval change.  Provider: Briscoe Burns   Lumbar DG Myelogram Lumbosacral:  Results for orders placed during the hospital encounter of 07/23/17  DG MYELOGRAPHY LUMBAR INJ LUMBOSACRAL   Narrative CLINICAL DATA:  Posterior left leg pain. Low back pain improved after spinal stimulator placement.  EXAM: LUMBAR MYELOGRAM  FLUOROSCOPY TIME:  Radiation Exposure Index (as provided by the fluoroscopic device): 221.06 microGray*m^2  Fluoroscopy Time (in minutes and seconds):  15 seconds  PROCEDURE: After thorough discussion of risks and benefits of the procedure including bleeding, infection, injury to nerves, blood vessels, adjacent structures as well as headache and CSF leak, written and oral informed consent was obtained. Consent was obtained by Dr. Logan Bores. Time out form was completed.  Patient was positioned prone on the fluoroscopy table. Local anesthesia was provided with 1% lidocaine without epinephrine after prepped and draped in the usual sterile fashion. Puncture  was performed at L3-4 using a 3 1/2 inch 22-gauge spinal needle via a right interlaminar approach. Using a single pass through the dura, the needle was placed within the thecal sac, with return of clear CSF. 15 mL of Isovue M-200 was injected into the thecal sac, with normal opacification of the nerve roots and cauda equina consistent with free flow within the subarachnoid space.  I personally performed the lumbar puncture and administered the intrathecal contrast. I also personally supervised acquisition of the myelogram images.  TECHNIQUE: Contiguous axial images were obtained through the Lumbar spine after the intrathecal infusion of infusion. Coronal and sagittal reconstructions were obtained of the axial image sets.  COMPARISON:  Lumbar MRI 05/17/2011  FINDINGS: LUMBAR MYELOGRAM FINDINGS:  L5 is mildly transitional with an enlarged left-sided transverse process which demonstrates a pseudoarticulation with the sacrum. There is slight anterolisthesis of L2 on L3, L3 on L4, and L4 on L5, greatest at L3-4. These do not significantly worsen with flexion, and each appears to partially reduce with extension. Ventral extradural defects are present throughout the lumbar spine, most notable at L1-2 and L3-4. No high-grade spinal stenosis or nerve root  sleeve cut off is identified. A spinal cord stimulator and hip arthroplasty are partially visualized.  CT LUMBAR MYELOGRAM FINDINGS:  Minimal anterolisthesis of L2 on L3, L3 on L4, and L4 on L5 is similar to the prior MRI. No fracture or suspicious osseous lesion is identified. A spinal cord stimulator enters the dorsal epidural space at the T12-L1 level and courses superiorly into the thoracic spine. The conus medullaris terminates at the upper L2 level. Aortoiliac atherosclerosis is noted without aneurysm.  T12-L1: Mild disc bulging and right greater than left facet hypertrophy without stenosis.  L1-2: Circumferential disc  bulging and mild facet hypertrophy result in minimal right neural foraminal stenosis without spinal stenosis, unchanged.  L2-3: Disc bulging, left foraminal and extraforaminal disc protrusion, and moderate facet arthrosis result in mild right and moderate left neural foraminal stenosis without spinal stenosis, unchanged. Disc could affect the exiting left L2 nerve root, although frank neural compression is not evident.  L3-4: Circumferential disc bulging and severe facet arthrosis result in moderate right greater than left neural foraminal stenosis. Left neural foraminal stenosis has mildly progressed, and disc could affect the left L3 nerve lateral to the foramen. No spinal stenosis.  L4-5: Disc bulging and severe right and moderate left facet arthrosis result in mild right and moderate left neural foraminal stenosis. Left neural foraminal stenosis has progressed due to increased leftward disc bulging/superimposed left foraminal disc protrusion and could affect the exiting left L4 nerve root though frank neural compression is not evident. No spinal stenosis.  L5-S1: Mild disc bulging, mild enlargement of a right foraminal and extraforaminal disc protrusion, and mild facet arthrosis result in new mild right neural foraminal stenosis without spinal stenosis.  IMPRESSION: 1. Lumbar spondylosis and advanced facet arthrosis without spinal stenosis. 2. Moderate left neural foraminal stenosis at L3-4 and L4-5, both mildly progressed from 2013. 3. Unchanged moderate left neural foraminal stenosis at L2-3. 4.  Aortic Atherosclerosis (ICD10-I70.0).   Electronically Signed   By: Logan Bores M.D.   On: 07/23/2017 12:07    Knee Imaging:. Knee-R MR wo contrast:  Results for orders placed during the hospital encounter of 11/14/10  MR Knee Right Wo Contrast   Narrative *RADIOLOGY REPORT*  Clinical Data: Right knee pain and instability for several years. No recent injury or given  history of prior relevant surgery.  MRI OF THE RIGHT KNEE WITHOUT CONTRAST  Technique:  Multiplanar, multisequence MR imaging was performed. No intravenous contrast was administered.  Comparison: Radiographs 03/15/2006 and MRI 05/12/2000.  Findings: Examination is mildly motion degraded despite repeating several sequences.  There is a small knee joint effusion and a small Baker's cyst.  The extensor mechanism is intact.  Patellar cartilage is mildly thinned at the apex without focal chondral defect.  Trochlear cartilage is preserved.  The weightbearing articular cartilage appears well preserved for age.  There is no evidence of fracture or osteochondral lesion. Both menisci appear normal.  The cruciate and collateral ligaments are intact.  IMPRESSION:  1.  Stable very mild degenerative changes for age.  No acute osteochondral findings. 2.  No evidence of meniscal or ligamentous tear. 3.  Small Baker's cyst.  Original Report Authenticated By: Vivia Ewing, M.D.   Knee-L DG 4 views:  Results for orders placed during the hospital encounter of 04/30/15  DG Knee Complete 4 Views Left   Narrative CLINICAL DATA:  Chronic posterior left knee pain without known injury; known arthritis in both knees.  EXAM: LEFT KNEE - COMPLETE 4+ VIEW  COMPARISON:  Left knee series of March 13, 2013  FINDINGS: The bones are adequately mineralized. There is mild narrowing of the lateral joint compartment. There is mild beaking of the medial tibial spine. The lateral and patellofemoral joint compartments are well maintained. There tiny spurs arising from the articular margins of the patella. No joint effusion is observed. There is no acute fracture nor dislocation.  IMPRESSION: There are mild to moderate osteoarthritic changes of the left knee centered on the lateral and patellofemoral compartments. There is no acute bony abnormality.   Electronically Signed   By: David  Martinique  M.D.   On: 04/30/2015 15:25    Foot Imaging: Foot-R DG Complete:  Results for orders placed during the hospital encounter of 03/03/14  DG Foot Complete Right   Narrative CLINICAL DATA:  Bilateral foot swelling, right more than left. Severe pain in the first and second toes.  EXAM: RIGHT FOOT COMPLETE - 3+ VIEW  COMPARISON:  None.  FINDINGS: There is osteoarthritis of the first metatarsal phalangeal joint with slight hallux valgus deformity and bunion formation. The second toe appears normal.  Small posterior and plantar calcaneal spurs. Minimal osteophytes on the distal tibia.  IMPRESSION: No acute abnormalities. Osteoarthritis of the first metatarsal phalangeal joint.   Electronically Signed   By: Rozetta Nunnery M.D.   On: 03/03/2014 10:53    Complexity Note: Imaging results reviewed. Results shared with Ms. Aline Brochure, using Layman's terms.                         Meds   Current Outpatient Medications:  .  Cholecalciferol (VITAMIN D3) 5000 units CAPS, Take 5,000 Units by mouth daily., Disp: , Rfl:  .  dicyclomine (BENTYL) 10 MG capsule, Take 1 capsule 2-3 times daily 30 minutes before a meal for abdominal cramping and loose stools. (Patient taking differently: Take 10 mg by mouth 3 (three) times daily as needed for spasms. ), Disp: 270 capsule, Rfl: 1 .  doxepin (SINEQUAN) 25 MG capsule, Take 75 mg by mouth at bedtime. , Disp: , Rfl:  .  Fluticasone-Salmeterol (ADVAIR) 250-50 MCG/DOSE AEPB, Inhale 1 puff into the lungs 2 (two) times daily as needed (for shortness of breath)., Disp: , Rfl:  .  gabapentin (NEURONTIN) 100 MG capsule, TAKE ONE CAPSULE BY MOUTH 2 TIMES A DAY AS NEEDED FOR PAIN., Disp: 60 capsule, Rfl: 0 .  glipiZIDE (GLUCOTROL) 5 MG tablet, Take 1 tablet (5 mg total) by mouth daily before breakfast., Disp: 90 tablet, Rfl: 1 .  guaiFENesin (MUCINEX) 600 MG 12 hr tablet, Take 600 mg by mouth 2 (two) times daily as needed for cough or to loosen phlegm., Disp: ,  Rfl:  .  levothyroxine (SYNTHROID, LEVOTHROID) 75 MCG tablet, Take 1 tablet (75 mcg total) by mouth daily., Disp: 90 tablet, Rfl: 0 .  omeprazole (PRILOSEC) 20 MG capsule, Take 20 mg by mouth 2 (two) times daily. , Disp: , Rfl:  .  PROAIR HFA 108 (90 Base) MCG/ACT inhaler, Inhale 2 puffs into the lungs every 6 (six) hours as needed for wheezing. , Disp: , Rfl:  .  TRADJENTA 5 MG TABS tablet, TAKE 1 TABLET BY MOUTH  DAILY, Disp: 90 tablet, Rfl: 0 No current facility-administered medications for this visit.   Facility-Administered Medications Ordered in Other Visits:  .  vancomycin (VANCOCIN) IVPB 1000 mg/200 mL premix, 1,000 mg, Intravenous, 60 min Pre-Op, Marlou Sa, Tonna Corner, MD  ROS  Constitutional: Denies any fever or  chills Gastrointestinal: No reported hemesis, hematochezia, vomiting, or acute GI distress Musculoskeletal: Denies any acute onset joint swelling, redness, loss of ROM, or weakness Neurological: No reported episodes of acute onset apraxia, aphasia, dysarthria, agnosia, amnesia, paralysis, loss of coordination, or loss of consciousness  Allergies  Ms. Alling is allergic to oxycodone; codeine; hydrocodone; and naproxen.  [place]  Drug: Ms. Tatum  reports that Shelby does not use drugs. Alcohol:  reports that Shelby does not drink alcohol. Tobacco:  reports that Shelby has quit smoking. Her smoking use included cigarettes. Shelby has never used smokeless tobacco. 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 Surgical: Ms. Weinheimer  has a past surgical history that includes Vesicovaginal fistula closure w/ TAH; Carpal tunnel release - bilateral (1992); Total hip replacement - right (2002); Stomach surgery gastropexy for gastric volvulus (2009); Tubular Adenoma; Shoulder surgery; Spinal cord stimulator insertion; Esophagogastroduodenoscopy (08/12/2007); Colonoscopy (N/A, 03/04/2013); Abdominal hysterectomy; Colonoscopy (06/14/2006); Cerebral aneurysm repair (2004); Eye surgery; Cataract extraction w/ intraocular lens implant (Bilateral); Cardiac catheterization; Tubal ligation; and Total shoulder arthroplasty (Left, 01/30/2017). Family: family history includes Asthma in her unknown relative; Cancer in her brother, brother, mother, and sister; Diabetes in her brother; Heart attack in her sister; Heart disease in her mother; Lung disease in her unknown relative.  Constitutional Exam  General appearance: Well nourished, well developed, and well hydrated. In no apparent acute distress Vitals:   11/26/17 1257  BP: (!) 150/98  Pulse: 81  Resp: 20  Temp: 98.1 F (36.7 C)  TempSrc: Oral  SpO2: 100%  Weight: 215 lb (97.5 kg)  Height: 5' (1.524 m)   BMI Assessment: Estimated body mass index is 41.99 kg/m as calculated from the following:   Height as of this encounter: 5' (1.524 m).   Weight as of this encounter: 215 lb (97.5 kg).  BMI interpretation table: BMI level Category Range association with higher incidence of chronic pain  <18 kg/m2 Underweight   18.5-24.9 kg/m2 Ideal body weight   25-29.9 kg/m2 Overweight Increased incidence by 20%  30-34.9 kg/m2 Obese (Class I) Increased incidence by 68%  35-39.9 kg/m2 Severe obesity (Class II) Increased incidence by 136%  >40 kg/m2 Extreme obesity (Class III) Increased incidence by 254%   Patient's current BMI Ideal Body weight  Body mass index is 41.99 kg/m. Ideal body weight: 45.5 kg (100 lb 4.9 oz) Adjusted ideal body  weight: 66.3 kg (146 lb 3 oz)   BMI Readings from Last 4 Encounters:  11/26/17 41.99 kg/m  10/23/17 41.60 kg/m  08/14/17 40.82 kg/m  05/15/17 40.62 kg/m   Wt Readings from Last 4 Encounters:  11/26/17 215 lb (97.5 kg)  10/23/17 213 lb (96.6 kg)  08/14/17 209 lb (94.8 kg)  05/15/17 208 lb (94.3 kg)  Psych/Mental status: Alert, oriented x 3 (person, place, & time)       Eyes: PERLA Respiratory: No evidence of acute respiratory distress  Cervical Spine Area Exam  Skin & Axial Inspection: No masses, redness, edema, swelling, or associated skin lesions Alignment: Symmetrical Functional ROM: Unrestricted ROM      Stability: No instability detected Muscle Tone/Strength: Functionally intact.  No obvious neuro-muscular anomalies detected. Sensory (Neurological): Unimpaired Palpation: No palpable anomalies              Upper Extremity (UE) Exam    Side: Right upper extremity  Side: Left upper extremity  Skin & Extremity Inspection: Skin color, temperature, and hair growth are WNL. No peripheral edema or cyanosis. No masses, redness, swelling, asymmetry, or associated skin lesions. No contractures.  Skin & Extremity Inspection: Skin color, temperature, and hair growth are WNL. No peripheral edema or cyanosis. No masses, redness, swelling, asymmetry, or associated skin lesions. No contractures.  Functional ROM: Unrestricted ROM          Functional ROM: Unrestricted ROM          Muscle Tone/Strength: Functionally intact. No obvious neuro-muscular anomalies detected.  Muscle Tone/Strength: Functionally intact. No obvious neuro-muscular anomalies detected.  Sensory (Neurological): Unimpaired          Sensory (Neurological): Unimpaired          Palpation: No palpable anomalies              Palpation: No palpable anomalies              Provocative Test(s):  Phalen's test: deferred Tinel's test: deferred Apley's scratch test (touch opposite shoulder):  Action 1 (Across chest): deferred Action  2 (Overhead): deferred Action 3 (LB reach): deferred   Provocative Test(s):  Phalen's test: deferred Tinel's test: deferred Apley's scratch test (touch opposite shoulder):  Action 1 (Across chest): deferred Action 2 (Overhead): deferred Action 3 (LB reach): deferred    Thoracic Spine Area Exam  Skin & Axial Inspection: No masses, redness, or swelling Alignment: Symmetrical Functional ROM: Unrestricted ROM Stability: No instability detected Muscle Tone/Strength: Functionally intact. No obvious neuro-muscular anomalies detected. Sensory (Neurological): Unimpaired Muscle strength & Tone: No palpable anomalies  Lumbar Spine Area Exam  Skin & Axial Inspection: No masses, redness, or swelling Alignment: Symmetrical Functional ROM: Decreased ROM       Stability: No instability detected Muscle Tone/Strength: Increased muscle tone over affected area Sensory (Neurological): Movement-associated pain Palpation: Complains of area being tender to palpation       Provocative Tests: Hyperextension/rotation test: Equivocal       Lumbar quadrant test (Kemp's test): deferred today       Lateral bending test: deferred today       Patrick's Maneuver: (+) for left hip arthralgia             FABER test: (+) for left hip arthralgia             S-I anterior distraction/compression test: deferred today         S-I lateral compression test: deferred today         S-I Thigh-thrust test: deferred today         S-I Gaenslen's test: deferred today          Gait & Posture Assessment  Ambulation: Patient ambulates using a cane Gait: Modified gait pattern (slower gait speed, wider stride width, and longer stance duration) associated with morbid obesity Posture: Difficulty standing up straight, due to pain   Lower Extremity Exam    Side: Right lower extremity  Side: Left lower extremity  Stability: No instability observed          Stability: No instability observed          Skin & Extremity Inspection:  Skin color, temperature, and hair growth are WNL. No peripheral edema or cyanosis. No masses, redness,  swelling, asymmetry, or associated skin lesions. No contractures.  Skin & Extremity Inspection: Skin color, temperature, and hair growth are WNL. No peripheral edema or cyanosis. No masses, redness, swelling, asymmetry, or associated skin lesions. No contractures.  Functional ROM: Decreased ROM for hip and knee joints          Functional ROM: Decreased ROM for hip and knee joints          Muscle Tone/Strength: Functionally intact. No obvious neuro-muscular anomalies detected.  Muscle Tone/Strength: Functionally intact. No obvious neuro-muscular anomalies detected.  Sensory (Neurological): Unimpaired  Sensory (Neurological): Unimpaired  Palpation: No palpable anomalies  Palpation: No palpable anomalies   Assessment & Plan  Primary Diagnosis & Pertinent Problem List: The primary encounter diagnosis was Chronic pain of left lower extremity (Primary Area of Pain). Diagnoses of Chronic pain of both hips (Secondary Area of Pain) (R>L), Spinal cord stimulator status, S/P hip replacement, right, Chronic hip pain, left, Primary osteoarthritis of left hip, History of attempted suicide (w/ Medications), Chronic hip pain after total replacement (Right), and Lumbar Grade 1 Anterolisthesis (67m) of L3 over L4 were also pertinent to this visit.  Visit Diagnosis: 1. Chronic pain of left lower extremity (Primary Area of Pain)   2. Chronic pain of both hips (Secondary Area of Pain) (R>L)   3. Spinal cord stimulator status   4. S/P hip replacement, right   5. Chronic hip pain, left   6. Primary osteoarthritis of left hip   7. History of attempted suicide (w/ Medications)   8. Chronic hip pain after total replacement (Right)   9. Lumbar Grade 1 Anterolisthesis (573m of L3 over L4    Problems updated and reviewed during this visit: Problem  Chronic hip pain after total replacement (Right)  Lumbar Grade 1  Anterolisthesis (4m63mof L3 over L4   5 mm anterolisthesis of L3 on L4   S/P hip replacement (Right)  Chronic hip pain (Left)  Osteoarthritis of hip (Left)  Chronic lower extremity pain (Primary Area of Pain) (Left)  Chronic hip pain (Secondary Area of Pain) (Bilateral) (R>L)  Chronic knee pain (Tertiary Area of Pain) (Bilateral) (L>R)  Osteoarthritis of knee (Bilateral)  History of attempted suicide (w/ Medications)  Spinal cord stimulator implant   Time Note: Greater than 50% of the 40 minute(s) of face-to-face time spent with Ms. HarOsentoskias spent in counseling/coordination of care regarding: the appropriate use of the pain scale, Ms. Birkeland's primary cause of pain, the results of her recent test(s), the treatment plan, treatment alternatives, the risks and possible complications of proposed treatment, realistic expectations, the goals of pain management (increased in functionality), the need to bring and keep the BMI below 30 and the need to collect and read the AVS material.  Plan of Care  Pharmacotherapy (Medications Ordered): No orders of the defined types were placed in this encounter.   Procedure Orders     HIP INJECTION Lab Orders  No laboratory test(s) ordered today    Imaging Orders     DG Lumbar Spine Complete W/Bend     DG Thoracic Spine 2 View Referral Orders  No referral(s) requested today    Pharmacological management options:  Opioid Analgesics: We'll take over management today. See above orders Membrane stabilizer: We have discussed the possibility of optimizing this mode of therapy, if tolerated Muscle relaxant: We have discussed the possibility of a trial NSAID: We have discussed the possibility of a trial Other analgesic(s): To be determined at a later time  Interventional management options: Planned, scheduled, and/or pending:    Diagnostic left IA Hip Blk #1  Bring [employer] to check her programming.    Considering:   Diagnostic left  transforaminal epidural steroid injection Diagnostic bilateral lumbar facet nerve block Possible bilateral lumbar facet radiofrequency ablation Diagnostic left intra-articular hip injection Diagnostic right foraminal femoral and obturator nerve block Diagnostic bilateral intra-articular knee injection Diagnostic bilateral Hyalgan series   PRN Procedures:   None at this time   Provider-requested follow-up: Return for Procedure (w/ sedation): (L) Hip BLK #1.  Future Appointments  Date Time Provider [place]  12/11/2017  1:00 PM Milinda Pointer, MD [facility] None  12/17/2017 10:30 AM Nida, Marella Chimes, MD [facility] None    Primary Care Physician: Lemmie Evens, MD Location: [facility] Note by: Gaspar Cola, MD Date: 11/26/2017; Time: 2:27 PM

## 2017-11-26 ENCOUNTER — Other Ambulatory Visit: Payer: Self-pay

## 2017-11-26 ENCOUNTER — Encounter: Payer: Self-pay | Admitting: Pain Medicine

## 2017-11-26 ENCOUNTER — Ambulatory Visit
Admission: RE | Admit: 2017-11-26 | Discharge: 2017-11-26 | Disposition: A | Discharge status: Home / Self Care | Payer: Medicare Other | Source: Ambulatory Visit | Attending: Pain Medicine | Admitting: Pain Medicine

## 2017-11-26 ENCOUNTER — Ambulatory Visit
Admission: RE | Admit: 2017-11-26 | Discharge: 2017-11-26 | Disposition: A | Discharge status: Home / Self Care | Payer: Medicare Other | Source: Ambulatory Visit | Attending: Nurse Practitioner | Admitting: Nurse Practitioner

## 2017-11-26 ENCOUNTER — Ambulatory Visit: Payer: Medicare Other | Attending: Pain Medicine | Admitting: Pain Medicine

## 2017-11-26 VITALS — BP 150/98 | HR 81 | Temp 98.1°F | Resp 20 | Ht 60.0 in | Wt 215.0 lb

## 2017-11-26 DIAGNOSIS — K589 Irritable bowel syndrome without diarrhea: Secondary | ICD-10-CM | POA: Insufficient documentation

## 2017-11-26 DIAGNOSIS — F329 Major depressive disorder, single episode, unspecified: Secondary | ICD-10-CM | POA: Diagnosis not present

## 2017-11-26 DIAGNOSIS — M1712 Unilateral primary osteoarthritis, left knee: Secondary | ICD-10-CM | POA: Diagnosis not present

## 2017-11-26 DIAGNOSIS — M79605 Pain in left leg: Secondary | ICD-10-CM

## 2017-11-26 DIAGNOSIS — M170 Bilateral primary osteoarthritis of knee: Secondary | ICD-10-CM | POA: Diagnosis not present

## 2017-11-26 DIAGNOSIS — Z9689 Presence of other specified functional implants: Secondary | ICD-10-CM | POA: Diagnosis present

## 2017-11-26 DIAGNOSIS — M4310 Spondylolisthesis, site unspecified: Secondary | ICD-10-CM

## 2017-11-26 DIAGNOSIS — M1612 Unilateral primary osteoarthritis, left hip: Secondary | ICD-10-CM | POA: Diagnosis not present

## 2017-11-26 DIAGNOSIS — J45909 Unspecified asthma, uncomplicated: Secondary | ICD-10-CM | POA: Diagnosis not present

## 2017-11-26 DIAGNOSIS — G8929 Other chronic pain: Secondary | ICD-10-CM

## 2017-11-26 DIAGNOSIS — M25552 Pain in left hip: Secondary | ICD-10-CM | POA: Diagnosis present

## 2017-11-26 DIAGNOSIS — M160 Bilateral primary osteoarthritis of hip: Secondary | ICD-10-CM | POA: Insufficient documentation

## 2017-11-26 DIAGNOSIS — K219 Gastro-esophageal reflux disease without esophagitis: Secondary | ICD-10-CM | POA: Insufficient documentation

## 2017-11-26 DIAGNOSIS — M546 Pain in thoracic spine: Secondary | ICD-10-CM | POA: Diagnosis not present

## 2017-11-26 DIAGNOSIS — I129 Hypertensive chronic kidney disease with stage 1 through stage 4 chronic kidney disease, or unspecified chronic kidney disease: Secondary | ICD-10-CM | POA: Diagnosis not present

## 2017-11-26 DIAGNOSIS — G894 Chronic pain syndrome: Secondary | ICD-10-CM | POA: Insufficient documentation

## 2017-11-26 DIAGNOSIS — E1122 Type 2 diabetes mellitus with diabetic chronic kidney disease: Secondary | ICD-10-CM | POA: Diagnosis not present

## 2017-11-26 DIAGNOSIS — E039 Hypothyroidism, unspecified: Secondary | ICD-10-CM | POA: Insufficient documentation

## 2017-11-26 DIAGNOSIS — N183 Chronic kidney disease, stage 3 (moderate): Secondary | ICD-10-CM | POA: Insufficient documentation

## 2017-11-26 DIAGNOSIS — M4316 Spondylolisthesis, lumbar region: Secondary | ICD-10-CM | POA: Diagnosis not present

## 2017-11-26 DIAGNOSIS — M545 Low back pain: Secondary | ICD-10-CM | POA: Diagnosis not present

## 2017-11-26 DIAGNOSIS — Z96641 Presence of right artificial hip joint: Secondary | ICD-10-CM | POA: Insufficient documentation

## 2017-11-26 DIAGNOSIS — Z9151 Personal history of suicidal behavior: Secondary | ICD-10-CM

## 2017-11-26 DIAGNOSIS — M25551 Pain in right hip: Secondary | ICD-10-CM

## 2017-11-26 DIAGNOSIS — Z6841 Body Mass Index (BMI) 40.0 and over, adult: Secondary | ICD-10-CM | POA: Diagnosis not present

## 2017-11-26 DIAGNOSIS — Z915 Personal history of self-harm: Secondary | ICD-10-CM

## 2017-11-26 NOTE — Patient Instructions (Signed)
____________________________________________________________________________________________  Preparing for Procedure with Sedation  Instructions: . Oral Intake: Do not eat or drink anything for at least 8 hours prior to your procedure. . Transportation: Public transportation is not allowed. Bring an adult driver. The driver must be physically present in our waiting room before any procedure can be started. . Physical Assistance: Bring an adult physically capable of assisting you, in the event you need help. This adult should keep you company at home for at least 6 hours after the procedure. . Blood Pressure Medicine: Take your blood pressure medicine with a sip of water the morning of the procedure. . Blood thinners: Notify our staff if you are taking any blood thinners. Depending on which one you take, there will be specific instructions on how and when to stop it. . Diabetics on insulin: Notify the staff so that you can be scheduled 1st case in the morning. If your diabetes requires high dose insulin, take only  of your normal insulin dose the morning of the procedure and notify the staff that you have done so. . Preventing infections: Shower with an antibacterial soap the morning of your procedure. . Build-up your immune system: Take 1000 mg of Vitamin C with every meal (3 times a day) the day prior to your procedure. . Antibiotics: Inform the staff if you have a condition or reason that requires you to take antibiotics before dental procedures. . Pregnancy: If you are pregnant, call and cancel the procedure. . Sickness: If you have a cold, fever, or any active infections, call and cancel the procedure. . Arrival: You must be in the facility at least 30 minutes prior to your scheduled procedure. . Children: Do not bring children with you. . Dress appropriately: Bring dark clothing that you would not mind if they get stained. . Valuables: Do not bring any jewelry or valuables.  Procedure  appointments are reserved for interventional treatments only. . No Prescription Refills. . No medication changes will be discussed during procedure appointments. . No disability issues will be discussed.  Reasons to call and reschedule or cancel your procedure: (Following these recommendations will minimize the risk of a serious complication.) . Surgeries: Avoid having procedures within 2 weeks of any surgery. (Avoid for 2 weeks before or after any surgery). . Flu Shots: Avoid having procedures within 2 weeks of a flu shots or . (Avoid for 2 weeks before or after immunizations). . Barium: Avoid having a procedure within 7-10 days after having had a radiological study involving the use of radiological contrast. (Myelograms, Barium swallow or enema study). . Heart attacks: Avoid any elective procedures or surgeries for the initial 6 months after a "Myocardial Infarction" (Heart Attack). . Blood thinners: It is imperative that you stop these medications before procedures. Let us know if you if you take any blood thinner.  . Infection: Avoid procedures during or within two weeks of an infection (including chest colds or gastrointestinal problems). Symptoms associated with infections include: Localized redness, fever, chills, night sweats or profuse sweating, burning sensation when voiding, cough, congestion, stuffiness, runny nose, sore throat, diarrhea, nausea, vomiting, cold or Flu symptoms, recent or current infections. It is specially important if the infection is over the area that we intend to treat. . Heart and lung problems: Symptoms that may suggest an active cardiopulmonary problem include: cough, chest pain, breathing difficulties or shortness of breath, dizziness, ankle swelling, uncontrolled high or unusually low blood pressure, and/or palpitations. If you are experiencing any of these symptoms, cancel   your procedure and contact your primary care physician for an evaluation.  Remember:   Regular Business hours are:  Monday to Thursday 8:00 AM to 4:00 PM  Provider's Schedule: Briann Sarchet, MD:  Procedure days: Tuesday and Thursday 7:30 AM to 4:00 PM  Bilal Lateef, MD:  Procedure days: Monday and Wednesday 7:30 AM to 4:00 PM ____________________________________________________________________________________________    

## 2017-11-27 NOTE — Progress Notes (Signed)
Results were reviewed and found to be: mildly abnormal  No acute injury or pathology identified  Review would suggest interventional pain management techniques may be of benefit 

## 2017-11-28 DIAGNOSIS — M4310 Spondylolisthesis, site unspecified: Secondary | ICD-10-CM | POA: Insufficient documentation

## 2017-11-28 DIAGNOSIS — Z915 Personal history of self-harm: Secondary | ICD-10-CM | POA: Insufficient documentation

## 2017-11-28 DIAGNOSIS — Z96641 Presence of right artificial hip joint: Secondary | ICD-10-CM

## 2017-11-28 DIAGNOSIS — Z9151 Personal history of suicidal behavior: Secondary | ICD-10-CM | POA: Insufficient documentation

## 2017-11-28 DIAGNOSIS — G8929 Other chronic pain: Secondary | ICD-10-CM | POA: Insufficient documentation

## 2017-11-28 DIAGNOSIS — M25551 Pain in right hip: Secondary | ICD-10-CM

## 2017-12-04 ENCOUNTER — Telehealth: Payer: Self-pay | Admitting: "Endocrinology

## 2017-12-04 NOTE — Telephone Encounter (Signed)
[name] is calling stating that the TRADJENTA 5 MG TABS tablet and Jardiance is going to cost her $400 out of pocket and she can not afford it and asking for it to be changed, please advise?

## 2017-12-04 NOTE — Telephone Encounter (Signed)
I did not prescribe Jardiance for her and I do not want her to be on Jardiance.  If [name] is costing her that much, she can stay off of it.  She can stay only on the glipizide.  We will evaluate her next options during her next visit.

## 2017-12-05 NOTE — Telephone Encounter (Signed)
Pt.notified

## 2017-12-11 ENCOUNTER — Ambulatory Visit: Payer: Medicare Other | Admitting: Pain Medicine

## 2017-12-11 LAB — COMPLETE METABOLIC PANEL WITH GFR
AG RATIO: 1.1 (calc) (ref 1.0–2.5)
ALT: 19 U/L (ref 6–29)
AST: 24 U/L (ref 10–35)
Albumin: 3.9 g/dL (ref 3.6–5.1)
Alkaline phosphatase (APISO): 109 U/L (ref 33–130)
BUN/Creatinine Ratio: 5 (calc) — ABNORMAL LOW (ref 6–22)
BUN: 8 mg/dL (ref 7–25)
CALCIUM: 9 mg/dL (ref 8.6–10.4)
CO2: 27 mmol/L (ref 20–32)
Chloride: 104 mmol/L (ref 98–110)
Creat: 1.61 mg/dL — ABNORMAL HIGH (ref 0.60–0.93)
GFR, EST AFRICAN AMERICAN: 35 mL/min/{1.73_m2} — AB (ref >=60)
GFR, EST NON AFRICAN AMERICAN: 30 mL/min/{1.73_m2} — AB (ref >=60)
GLOBULIN: 3.4 g/dL (ref 1.9–3.7)
Glucose, Bld: 111 mg/dL — ABNORMAL HIGH (ref 65–99)
Potassium: 5.1 mmol/L (ref 3.5–5.3)
SODIUM: 139 mmol/L (ref 135–146)
TOTAL PROTEIN: 7.3 g/dL (ref 6.1–8.1)
Total Bilirubin: 0.3 mg/dL (ref 0.2–1.2)

## 2017-12-11 LAB — T4, FREE: Free T4: 1 ng/dL (ref 0.8–1.8)

## 2017-12-11 LAB — TSH: TSH: 1.19 mIU/L (ref 0.40–4.50)

## 2017-12-11 LAB — HEMOGLOBIN A1C
Hgb A1c MFr Bld: 7 % of total Hgb — ABNORMAL HIGH (ref <5.7)
Mean Plasma Glucose: 154 (calc)
eAG (mmol/L): 8.5 (calc)

## 2017-12-11 NOTE — Progress Notes (Deleted)
Patient's Name: [name]  MRN: [id number]  Referring Provider: Gareth Morgan, MD  DOB: 1940/08/11  PCP: Gareth Morgan, MD  DOS: 12/11/2017  Note by: Oswaldo Done, MD  Service setting: Ambulatory outpatient  Specialty: Interventional Pain Management  Patient type: Established  Location: [facility] (AMB) Pain Management Facility  Visit type: Interventional Procedure   Primary Reason for Visit: Interventional Pain Management Treatment. CC: No chief complaint on file.  Procedure:          Anesthesia, Analgesia, Anxiolysis:  Type: Intra-Articular Hip Injection          Primary Purpose: Diagnostic Region: Posterolateral hip joint area. Level: Lower pelvic and hip joint level. Target Area: Superior aspect of the hip joint cavity, going thru the superior portion of the capsular ligament. Approach: Posterolateral approach. Laterality: Left-Sided  Type: Moderate (Conscious) Sedation combined with Local Anesthesia Indication(s): Analgesia and Anxiety Route: Intravenous (IV) IV Access: Secured Sedation: Meaningful verbal contact was maintained at all times during the procedure  Local Anesthetic: Lidocaine 1-2%  Position: Right Lateral Decubitus. Prepped Area: Entire Posterolateral hip area. Prepping solution: ChloraPrep (2% chlorhexidine gluconate and 70% isopropyl alcohol)   Indications: 1. Osteoarthritis of hip (Left)   2. Chronic hip pain (Left)    Pain Score: Pre-procedure:  /10 Post-procedure:  /10  Pre-op Assessment:  Ms. Galeno is a 77 y.o. (year old), female patient, seen today for interventional treatment. She  has a past surgical history that includes Vesicovaginal fistula closure w/ TAH; Carpal tunnel release - bilateral (1992); Total hip replacement - right (2002); Stomach surgery gastropexy for gastric volvulus (2009); Tubular Adenoma; Shoulder surgery; Spinal cord stimulator insertion; Esophagogastroduodenoscopy (08/12/2007); Colonoscopy (N/A, 03/04/2013); Abdominal  hysterectomy; Colonoscopy (06/14/2006); Cerebral aneurysm repair (2004); Eye surgery; Cataract extraction w/ intraocular lens implant (Bilateral); Cardiac catheterization; Tubal ligation; and Total shoulder arthroplasty (Left, 01/30/2017). Ms. Kernan has a current medication list which includes the following prescription(s): vitamin d3, dicyclomine, doxepin, fluticasone-salmeterol, gabapentin, glipizide, guaifenesin, levothyroxine, omeprazole, proair hfa, and tradjenta, and the following Facility-Administered Medications: vancomycin. Her primarily concern today is the No chief complaint on file.  Initial Vital Signs:  Pulse/HCG Rate:    Temp:   Resp:   BP:   SpO2:    BMI: Estimated body mass index is 41.99 kg/m as calculated from the following:   Height as of 11/26/17: 5' (1.524 m).   Weight as of 11/26/17: 215 lb (97.5 kg).  Risk Assessment: Allergies: Reviewed. She is allergic to oxycodone; codeine; hydrocodone; and naproxen.  Allergy Precautions: None required Coagulopathies: Reviewed. None identified.  Blood-thinner therapy: None at this time Active Infection(s): Reviewed. None identified. Ms. Palleschi is afebrile  Site Confirmation: Ms. Egelston was asked to confirm the procedure and laterality before marking the site Procedure checklist: Completed Consent: Before the procedure and under the influence of no sedative(s), amnesic(s), or anxiolytics, the patient was informed of the treatment options, risks and possible complications. To fulfill our ethical and legal obligations, as recommended by the American Medical Association's Code of Ethics, I have informed the patient of my clinical impression; the nature and purpose of the treatment or procedure; the risks, benefits, and possible complications of the intervention; the alternatives, including doing nothing; the risk(s) and benefit(s) of the alternative treatment(s) or procedure(s); and the risk(s) and benefit(s) of doing nothing. The  patient was provided information about the general risks and possible complications associated with the procedure. These may include, but are not limited to: failure to achieve desired goals, infection, bleeding, organ or  nerve damage, allergic reactions, paralysis, and death. In addition, the patient was informed of those risks and complications associated to the procedure, such as failure to decrease pain; infection; bleeding; organ or nerve damage with subsequent damage to sensory, motor, and/or autonomic systems, resulting in permanent pain, numbness, and/or weakness of one or several areas of the body; allergic reactions; (i.e.: anaphylactic reaction); and/or death. Furthermore, the patient was informed of those risks and complications associated with the medications. These include, but are not limited to: allergic reactions (i.e.: anaphylactic or anaphylactoid reaction(s)); adrenal axis suppression; blood sugar elevation that in diabetics may result in ketoacidosis or comma; water retention that in patients with history of congestive heart failure may result in shortness of breath, pulmonary edema, and decompensation with resultant heart failure; weight gain; swelling or edema; medication-induced neural toxicity; particulate matter embolism and blood vessel occlusion with resultant organ, and/or nervous system infarction; and/or aseptic necrosis of one or more joints. Finally, the patient was informed that Medicine is not an exact science; therefore, there is also the possibility of unforeseen or unpredictable risks and/or possible complications that may result in a catastrophic outcome. The patient indicated having understood very clearly. We have given the patient no guarantees and we have made no promises. Enough time was given to the patient to ask questions, all of which were answered to the patient's satisfaction. Ms. Buechele has indicated that she wanted to continue with the procedure. Attestation:  I, the ordering provider, attest that I have discussed with the patient the benefits, risks, side-effects, alternatives, likelihood of achieving goals, and potential problems during recovery for the procedure that I have provided informed consent. Date  Time: {CHL [facility]-PAIN TIME CHOICES:21018001}  Pre-Procedure Preparation:  Monitoring: As per clinic protocol. Respiration, ETCO2, SpO2, BP, heart rate and rhythm monitor placed and checked for adequate function Safety Precautions: Patient was assessed for positional comfort and pressure points before starting the procedure. Time-out: I initiated and conducted the "Time-out" before starting the procedure, as per protocol. The patient was asked to participate by confirming the accuracy of the "Time Out" information. Verification of the correct person, site, and procedure were performed and confirmed by me, the nursing staff, and the patient. "Time-out" conducted as per Joint Commission's Universal Protocol (UP.01.01.01). Time:    Description of Procedure:          Safety Precautions: Aspiration looking for blood return was conducted prior to all injections. At no point did we inject any substances, as a needle was being advanced. No attempts were made at seeking any paresthesias. Safe injection practices and needle disposal techniques used. Medications properly checked for expiration dates. SDV (single dose vial) medications used. Description of the Procedure: Protocol guidelines were followed. The patient was placed in position over the fluoroscopy table. The target area was identified and the area prepped in the usual manner. Skin & deeper tissues infiltrated with local anesthetic. Appropriate amount of time allowed to pass for local anesthetics to take effect. The procedure needles were then advanced to the target area. Proper needle placement secured. Negative aspiration confirmed. Solution injected in intermittent fashion, asking for systemic symptoms  every 0.5cc of injectate. The needles were then removed and the area cleansed, making sure to leave some of the prepping solution back to take advantage of its long term bactericidal properties. There were no vitals filed for this visit.  Start Time:   hrs. End Time:   hrs. Materials:  Needle(s) Type: Regular needle Gauge: 22G Length: 5-in Medication(s):  Please see orders for medications and dosing details.  Imaging Guidance (Non-Spinal):          Type of Imaging Technique: Fluoroscopy Guidance (Non-Spinal) Indication(s): Assistance in needle guidance and placement for procedures requiring needle placement in or near specific anatomical locations not easily accessible without such assistance. Exposure Time: Please see nurses notes. Contrast: Before injecting any contrast, we confirmed that the patient did not have an allergy to iodine, shellfish, or radiological contrast. Once satisfactory needle placement was completed at the desired level, radiological contrast was injected. Contrast injected under live fluoroscopy. No contrast complications. See chart for type and volume of contrast used. Fluoroscopic Guidance: I was personally present during the use of fluoroscopy. "Tunnel Vision Technique" used to obtain the best possible view of the target area. Parallax error corrected before commencing the procedure. "Direction-depth-direction" technique used to introduce the needle under continuous pulsed fluoroscopy. Once target was reached, antero-posterior, oblique, and lateral fluoroscopic projection used confirm needle placement in all planes. Images permanently stored in EMR. Interpretation: I personally interpreted the imaging intraoperatively. Adequate needle placement confirmed in multiple planes. Appropriate spread of contrast into desired area was observed. No evidence of afferent or efferent intravascular uptake. Permanent images saved into the patient's record.  Antibiotic Prophylaxis:    Anti-infectives (From admission, onward)   None     Indication(s): None identified  Post-operative Assessment:  Post-procedure Vital Signs:  Pulse/HCG Rate:    Temp:   Resp:   BP:   SpO2:    EBL: None  Complications: No immediate post-treatment complications observed by team, or reported by patient.  Note: The patient tolerated the entire procedure well. A repeat set of vitals were taken after the procedure and the patient was kept under observation following institutional policy, for this type of procedure. Post-procedural neurological assessment was performed, showing return to baseline, prior to discharge. The patient was provided with post-procedure discharge instructions, including a section on how to identify potential problems. Should any problems arise concerning this procedure, the patient was given instructions to immediately contact us, at any time, without hesitation. In any case, we plan to contact the patient by telephone for a follow-up status report regarding this interventional procedure.  Comments:  No additional relevant information.  Plan of Care   Imaging Orders  No imaging studies ordered today   Procedure Orders    No procedure(s) ordered today    Medications ordered for procedure: No orders of the defined types were placed in this encounter.  Medications administered: Dynasty H. Tulp had no medications administered during this visit.  See the medical record for exact dosing, route, and time of administration.  New Prescriptions   No medications on file   Disposition: Discharge home  Discharge Date & Time: 12/11/2017;   hrs.   Physician-requested Follow-up: No follow-ups on file.  Future Appointments  Date Time Provider Department Center  12/11/2017  1:00 PM Delano Metz, MD [facility] None  12/17/2017 10:30 AM Nida, Denman George, MD [facility] None   Primary Care Physician: Gareth Morgan, MD Location: [facility] Outpatient [facility]  Facility Note by: Oswaldo Done, MD Date: 12/11/2017; Time: 6:38 AM  Disclaimer:  Medicine is not an exact science. The only guarantee in medicine is that nothing is guaranteed. It is important to note that the decision to proceed with this intervention was based on the information collected from the patient. The Data and conclusions were drawn from the patient's questionnaire, the interview, and the physical examination. Because the information  was provided in large part by the patient, it cannot be guaranteed that it has not been purposely or unconsciously manipulated. Every effort has been made to obtain as much relevant data as possible for this evaluation. It is important to note that the conclusions that lead to this procedure are derived in large part from the available data. Always take into account that the treatment will also be dependent on availability of resources and existing treatment guidelines, considered by other Pain Management Practitioners as being common knowledge and practice, at the time of the intervention. For Medico-Legal purposes, it is also important to point out that variation in procedural techniques and pharmacological choices are the acceptable norm. The indications, contraindications, technique, and results of the above procedure should only be interpreted and judged by a Board-Certified Interventional Pain Specialist with extensive familiarity and expertise in the same exact procedure and technique.

## 2017-12-13 ENCOUNTER — Other Ambulatory Visit: Payer: Self-pay

## 2017-12-13 ENCOUNTER — Ambulatory Visit
Admission: RE | Admit: 2017-12-13 | Discharge: 2017-12-13 | Disposition: A | Discharge status: Home / Self Care | Payer: Medicare Other | Source: Ambulatory Visit | Attending: Pain Medicine | Admitting: Pain Medicine

## 2017-12-13 ENCOUNTER — Encounter: Payer: Self-pay | Admitting: Pain Medicine

## 2017-12-13 ENCOUNTER — Ambulatory Visit (HOSPITAL_BASED_OUTPATIENT_CLINIC_OR_DEPARTMENT_OTHER): Payer: Medicare Other | Admitting: Pain Medicine

## 2017-12-13 VITALS — BP 150/69 | HR 65 | Temp 96.7°F | Resp 14 | Ht 60.0 in | Wt 215.0 lb

## 2017-12-13 DIAGNOSIS — Z90710 Acquired absence of both cervix and uterus: Secondary | ICD-10-CM | POA: Insufficient documentation

## 2017-12-13 DIAGNOSIS — M25552 Pain in left hip: Secondary | ICD-10-CM | POA: Diagnosis not present

## 2017-12-13 DIAGNOSIS — Z961 Presence of intraocular lens: Secondary | ICD-10-CM | POA: Diagnosis not present

## 2017-12-13 DIAGNOSIS — Z7951 Long term (current) use of inhaled steroids: Secondary | ICD-10-CM | POA: Insufficient documentation

## 2017-12-13 DIAGNOSIS — Z9841 Cataract extraction status, right eye: Secondary | ICD-10-CM | POA: Diagnosis not present

## 2017-12-13 DIAGNOSIS — Z79890 Hormone replacement therapy (postmenopausal): Secondary | ICD-10-CM | POA: Insufficient documentation

## 2017-12-13 DIAGNOSIS — Z96641 Presence of right artificial hip joint: Secondary | ICD-10-CM | POA: Diagnosis not present

## 2017-12-13 DIAGNOSIS — G8929 Other chronic pain: Secondary | ICD-10-CM

## 2017-12-13 DIAGNOSIS — Z886 Allergy status to analgesic agent status: Secondary | ICD-10-CM | POA: Diagnosis not present

## 2017-12-13 DIAGNOSIS — Z79899 Other long term (current) drug therapy: Secondary | ICD-10-CM | POA: Diagnosis not present

## 2017-12-13 DIAGNOSIS — M79605 Pain in left leg: Secondary | ICD-10-CM | POA: Diagnosis present

## 2017-12-13 DIAGNOSIS — Z7984 Long term (current) use of oral hypoglycemic drugs: Secondary | ICD-10-CM | POA: Diagnosis not present

## 2017-12-13 DIAGNOSIS — Z9842 Cataract extraction status, left eye: Secondary | ICD-10-CM | POA: Insufficient documentation

## 2017-12-13 DIAGNOSIS — Z885 Allergy status to narcotic agent status: Secondary | ICD-10-CM | POA: Diagnosis not present

## 2017-12-13 DIAGNOSIS — M1612 Unilateral primary osteoarthritis, left hip: Secondary | ICD-10-CM | POA: Insufficient documentation

## 2017-12-13 MED ORDER — ROPIVACAINE HCL 2 MG/ML IJ SOLN
4.0000 mL | Freq: Once | INTRAMUSCULAR | Status: AC
  Administered 2017-12-13: 4 mL via INTRA_ARTICULAR
  Filled 2017-12-13: qty 10

## 2017-12-13 MED ORDER — FENTANYL CITRATE (PF) 100 MCG/2ML IJ SOLN
25.0000 ug | INTRAMUSCULAR | Status: DC | PRN
  Administered 2017-12-13: 100 ug via INTRAVENOUS
  Filled 2017-12-13: qty 2

## 2017-12-13 MED ORDER — MIDAZOLAM HCL 5 MG/5ML IJ SOLN
1.0000 mg | INTRAMUSCULAR | Status: DC | PRN
  Administered 2017-12-13: 2 mg via INTRAVENOUS
  Filled 2017-12-13: qty 5

## 2017-12-13 MED ORDER — LACTATED RINGERS IV SOLN
1000.0000 mL | Freq: Once | INTRAVENOUS | Status: AC
  Administered 2017-12-13: 1000 mL via INTRAVENOUS

## 2017-12-13 MED ORDER — LIDOCAINE HCL 2 % IJ SOLN
20.0000 mL | Freq: Once | INTRAMUSCULAR | Status: AC
  Administered 2017-12-13: 400 mg
  Filled 2017-12-13: qty 40

## 2017-12-13 MED ORDER — METHYLPREDNISOLONE ACETATE 80 MG/ML IJ SUSP
80.0000 mg | Freq: Once | INTRAMUSCULAR | Status: AC
  Administered 2017-12-13: 80 mg via INTRA_ARTICULAR
  Filled 2017-12-13: qty 1

## 2017-12-13 MED ORDER — IOPAMIDOL (ISOVUE-M 200) INJECTION 41%
10.0000 mL | Freq: Once | INTRAMUSCULAR | Status: AC
  Administered 2017-12-13: 10 mL via EPIDURAL
  Filled 2017-12-13: qty 10

## 2017-12-13 NOTE — Patient Instructions (Signed)

## 2017-12-13 NOTE — Progress Notes (Signed)
Safety precautions to be maintained throughout the outpatient stay will include: orient to surroundings, keep bed in low position, maintain call bell within reach at all times, provide assistance with transfer out of bed and ambulation.  

## 2017-12-13 NOTE — Progress Notes (Signed)
Patient's Name: [name]  MRN: [id number]  Referring Provider: Gareth MorganKnowlton, Steve, MD  DOB: 07/02/1940  PCP: Shelby MorganKnowlton, Steve, MD  DOS: 12/13/2017  Note by: Shelby DoneFrancisco A Sahar Ryback, MD  Service setting: Ambulatory outpatient  Specialty: Interventional Pain Management  Patient type: Established  Location: [facility] (AMB) Pain Management Facility  Visit type: Interventional Procedure   Primary Reason for Visit: Interventional Pain Management Treatment. CC: Leg Pain (left)  Procedure:          Anesthesia, Analgesia, Anxiolysis:  Type: Intra-Articular Hip Injection #1  Primary Purpose: Diagnostic Region: Posterolateral hip joint area. Level: Lower pelvic and hip joint level. Target Area: Superior aspect of the hip joint cavity, going thru the superior portion of the capsular ligament. Approach: Posterolateral approach. Laterality: Left-Sided  Type: Moderate (Conscious) Sedation combined with Local Anesthesia Indication(s): Analgesia and Anxiety Route: Intravenous (IV) IV Access: Secured Sedation: Meaningful verbal contact was maintained at all times during the procedure  Local Anesthetic: Lidocaine 1-2%  Position: Lateral Decubitus with bad side up Prepped Area: Entire Posterolateral hip area. Prepping solution: ChloraPrep (2% chlorhexidine gluconate and 70% isopropyl alcohol)   Indications: 1. Osteoarthritis of hip (Left)   2. Chronic hip pain (Left)    Pain Score: Pre-procedure: 6 /10 Post-procedure: 0-No pain/10  Pre-op Assessment:  [name] is a 77 y.o. (year old), female patient, seen today for interventional treatment. She  has a past surgical history that includes Vesicovaginal fistula closure w/ TAH; Carpal tunnel release - bilateral (1992); Total hip replacement - right (2002); Stomach surgery gastropexy for gastric volvulus (2009); Tubular Adenoma; Shoulder surgery; Spinal cord stimulator insertion; Esophagogastroduodenoscopy (08/12/2007); Colonoscopy (N/A, 03/04/2013); Abdominal  hysterectomy; Colonoscopy (06/14/2006); Cerebral aneurysm repair (2004); Eye surgery; Cataract extraction w/ intraocular lens implant (Bilateral); Cardiac catheterization; Tubal ligation; and Total shoulder arthroplasty (Left, 01/30/2017). [name] has a current medication list which includes the following prescription(s): vitamin d3, dicyclomine, doxepin, fluticasone-salmeterol, gabapentin, glipizide, guaifenesin, levothyroxine, omeprazole, proair hfa, and tradjenta, and the following Facility-Administered Medications: fentanyl, midazolam, and vancomycin. Her primarily concern today is the Leg Pain (left)  Initial Vital Signs:  Pulse/HCG Rate: 71ECG Heart Rate: 67 Temp: 98.5 F (36.9 C) Resp: 16 BP: (!) 149/82 SpO2: 100 %  BMI: Estimated body mass index is 41.99 kg/m as calculated from the following:   Height as of this encounter: 5' (1.524 m).   Weight as of this encounter: 215 lb (97.5 kg).  Risk Assessment: Allergies: Reviewed. She is allergic to oxycodone; codeine; hydrocodone; and naproxen.  Allergy Precautions: None required Coagulopathies: Reviewed. None identified.  Blood-thinner therapy: None at this time Active Infection(s): Reviewed. None identified. [name] is afebrile  Site Confirmation: [name] was asked to confirm the procedure and laterality before marking the site Procedure checklist: Completed Consent: Before the procedure and under the influence of no sedative(s), amnesic(s), or anxiolytics, the patient was informed of the treatment options, risks and possible complications. To fulfill our ethical and legal obligations, as recommended by the American Medical Association's Code of Ethics, I have informed the patient of my clinical impression; the nature and purpose of the treatment or procedure; the risks, benefits, and possible complications of the intervention; the alternatives, including doing nothing; the risk(s) and benefit(s) of the alternative  treatment(s) or procedure(s); and the risk(s) and benefit(s) of doing nothing. The patient was provided information about the general risks and possible complications associated with the procedure. These may include, but are not limited to: failure to achieve desired goals, infection, bleeding, organ or nerve  damage, allergic reactions, paralysis, and death. In addition, the patient was informed of those risks and complications associated to the procedure, such as failure to decrease pain; infection; bleeding; organ or nerve damage with subsequent damage to sensory, motor, and/or autonomic systems, resulting in permanent pain, numbness, and/or weakness of one or several areas of the body; allergic reactions; (i.e.: anaphylactic reaction); and/or death. Furthermore, the patient was informed of those risks and complications associated with the medications. These include, but are not limited to: allergic reactions (i.e.: anaphylactic or anaphylactoid reaction(s)); adrenal axis suppression; blood sugar elevation that in diabetics may result in ketoacidosis or comma; water retention that in patients with history of congestive heart failure may result in shortness of breath, pulmonary edema, and decompensation with resultant heart failure; weight gain; swelling or edema; medication-induced neural toxicity; particulate matter embolism and blood vessel occlusion with resultant organ, and/or nervous system infarction; and/or aseptic necrosis of one or more joints. Finally, the patient was informed that Medicine is not an exact science; therefore, there is also the possibility of unforeseen or unpredictable risks and/or possible complications that may result in a catastrophic outcome. The patient indicated having understood very clearly. We have given the patient no guarantees and we have made no promises. Enough time was given to the patient to ask questions, all of which were answered to the patient's satisfaction. Ms.  [name] has indicated that she wanted to continue with the procedure. Attestation: I, the ordering provider, attest that I have discussed with the patient the benefits, risks, side-effects, alternatives, likelihood of achieving goals, and potential problems during recovery for the procedure that I have provided informed consent. Date  Time: 12/13/2017 12:40 PM  Pre-Procedure Preparation:  Monitoring: As per clinic protocol. Respiration, ETCO2, SpO2, BP, heart rate and rhythm monitor placed and checked for adequate function Safety Precautions: Patient was assessed for positional comfort and pressure points before starting the procedure. Time-out: I initiated and conducted the "Time-out" before starting the procedure, as per protocol. The patient was asked to participate by confirming the accuracy of the "Time Out" information. Verification of the correct person, site, and procedure were performed and confirmed by me, the nursing staff, and the patient. "Time-out" conducted as per Joint Commission's Universal Protocol (UP.01.01.01). Time: 1321  Description of Procedure:          Safety Precautions: Aspiration looking for blood return was conducted prior to all injections. At no point did we inject any substances, as a needle was being advanced. No attempts were made at seeking any paresthesias. Safe injection practices and needle disposal techniques used. Medications properly checked for expiration dates. SDV (single dose vial) medications used. Description of the Procedure: Protocol guidelines were followed. The patient was placed in position over the fluoroscopy table. The target area was identified and the area prepped in the usual manner. Skin & deeper tissues infiltrated with local anesthetic. Appropriate amount of time allowed to pass for local anesthetics to take effect. The procedure needles were then advanced to the target area. Proper needle placement secured. Negative aspiration confirmed.  Solution injected in intermittent fashion, asking for systemic symptoms every 0.5cc of injectate. The needles were then removed and the area cleansed, making sure to leave some of the prepping solution back to take advantage of its long term bactericidal properties. Vitals:   12/13/17 1330 12/13/17 1338 12/13/17 1348 12/13/17 1358  BP: 132/71 138/65 138/61 (!) 150/69  Pulse: 65     Resp: 13 16 13 14   Temp:  Marland Kitchen)  96.8 F (36 C)  (!) 96.7 F (35.9 C)  TempSrc:  Temporal  Temporal  SpO2: 96% 97% 94% 95%  Weight:      Height:        Start Time: 1321 hrs. End Time: 1328 hrs. Materials:  Needle(s) Type: Spinal Needle Gauge: 22G Length: 5.0-in Medication(s): Please see orders for medications and dosing details. Imaging Guidance (Non-Spinal):          Type of Imaging Technique: Fluoroscopy Guidance (Non-Spinal) Indication(s): Assistance in needle guidance and placement for procedures requiring needle placement in or near specific anatomical locations not easily accessible without such assistance. Exposure Time: Please see nurses notes. Contrast: Before injecting any contrast, we confirmed that the patient did not have an allergy to iodine, shellfish, or radiological contrast. Once satisfactory needle placement was completed at the desired level, radiological contrast was injected. Contrast injected under live fluoroscopy. No contrast complications. See chart for type and volume of contrast used. Fluoroscopic Guidance: I was personally present during the use of fluoroscopy. "Tunnel Vision Technique" used to obtain the best possible view of the target area. Parallax error corrected before commencing the procedure. "Direction-depth-direction" technique used to introduce the needle under continuous pulsed fluoroscopy. Once target was reached, antero-posterior, oblique, and lateral fluoroscopic projection used confirm needle placement in all planes. Images permanently stored in EMR. Interpretation: I  personally interpreted the imaging intraoperatively. Adequate needle placement confirmed in multiple planes. Appropriate spread of contrast into desired area was observed. No evidence of afferent or efferent intravascular uptake. Permanent images saved into the patient's record.  Antibiotic Prophylaxis:   Anti-infectives (From admission, onward)   None     Indication(s): None identified  Post-operative Assessment:  Post-procedure Vital Signs:  Pulse/HCG Rate: 6570 Temp: (!) 96.7 F (35.9 C) Resp: 14 BP: (!) 150/69 SpO2: 95 %  EBL: None  Complications: No immediate post-treatment complications observed by team, or reported by patient.  Note: The patient tolerated the entire procedure well. A repeat set of vitals were taken after the procedure and the patient was kept under observation following institutional policy, for this type of procedure. Post-procedural neurological assessment was performed, showing return to baseline, prior to discharge. The patient was provided with post-procedure discharge instructions, including a section on how to identify potential problems. Should any problems arise concerning this procedure, the patient was given instructions to immediately contact us, at any time, without hesitation. In any case, we plan to contact the patient by telephone for a follow-up status report regarding this interventional procedure.  Comments:  No additional relevant information.  Plan of Care    Imaging Orders     DG C-Arm 1-60 Min-No Report  Procedure Orders     HIP INJECTION  Medications ordered for procedure: Meds ordered this encounter  Medications  . iopamidol (ISOVUE-M) 41 % intrathecal injection 10 mL    Must be Myelogram-compatible. If not available, you may substitute with a water-soluble, non-ionic, hypoallergenic, myelogram-compatible radiological contrast medium.  Marland Kitchen lidocaine (XYLOCAINE) 2 % (with pres) injection 400 mg  . midazolam (VERSED) 5 MG/5ML  injection 1-2 mg    Make sure Flumazenil is available in the pyxis when using this medication. If oversedation occurs, administer 0.2 mg IV over 15 sec. If after 45 sec no response, administer 0.2 mg again over 1 min; may repeat at 1 min intervals; not to exceed 4 doses (1 mg)  . fentaNYL (SUBLIMAZE) injection 25-50 mcg    Make sure Narcan is available in the pyxis when  using this medication. In the event of respiratory depression (RR< 8/min): Titrate NARCAN (naloxone) in increments of 0.1 to 0.2 mg IV at 2-3 minute intervals, until desired degree of reversal.  . lactated ringers infusion 1,000 mL  . ropivacaine (PF) 2 mg/mL (0.2%) (NAROPIN) injection 4 mL  . methylPREDNISolone acetate (DEPO-MEDROL) injection 80 mg   Medications administered: We administered iopamidol, lidocaine, midazolam, fentaNYL, lactated ringers, ropivacaine (PF) 2 mg/mL (0.2%), and methylPREDNISolone acetate.  See the medical record for exact dosing, route, and time of administration.  New Prescriptions   No medications on file   Disposition: Discharge home  Discharge Date & Time: 12/13/2017; 1405 hrs.   Physician-requested Follow-up: Return for post-procedure eval (2 wks), w/ Dr. Laban Emperor.  Future Appointments  Date Time Provider Department Center  12/17/2017 10:30 AM Roma Kayser, MD [facility] None  01/02/2018 11:15 AM Delano Metz, MD [facility] None   Primary Care Physician: Shelby Morgan, MD Location: [facility] Note by: Shelby Done, MD Date: 12/13/2017; Time: 2:21 PM  Disclaimer:  Medicine is not an Visual merchandiser. The only guarantee in medicine is that nothing is guaranteed. It is important to note that the decision to proceed with this intervention was based on the information collected from the patient. The Data and conclusions were drawn from the patient's questionnaire, the interview, and the physical examination. Because the information was provided in  large part by the patient, it cannot be guaranteed that it has not been purposely or unconsciously manipulated. Every effort has been made to obtain as much relevant data as possible for this evaluation. It is important to note that the conclusions that lead to this procedure are derived in large part from the available data. Always take into account that the treatment will also be dependent on availability of resources and existing treatment guidelines, considered by other Pain Management Practitioners as being common knowledge and practice, at the time of the intervention. For Medico-Legal purposes, it is also important to point out that variation in procedural techniques and pharmacological choices are the acceptable norm. The indications, contraindications, technique, and results of the above procedure should only be interpreted and judged by a Board-Certified Interventional Pain Specialist with extensive familiarity and expertise in the same exact procedure and technique.

## 2017-12-14 ENCOUNTER — Telehealth: Payer: Self-pay

## 2017-12-14 NOTE — Telephone Encounter (Signed)
Patient was called, no problems reported. 

## 2017-12-17 ENCOUNTER — Ambulatory Visit: Payer: Medicare Other | Admitting: "Endocrinology

## 2017-12-17 ENCOUNTER — Encounter: Payer: Self-pay | Admitting: "Endocrinology

## 2017-12-17 VITALS — BP 148/87 | HR 73 | Ht 60.0 in | Wt 213.0 lb

## 2017-12-17 DIAGNOSIS — N183 Chronic kidney disease, stage 3 unspecified: Secondary | ICD-10-CM

## 2017-12-17 DIAGNOSIS — I10 Essential (primary) hypertension: Secondary | ICD-10-CM

## 2017-12-17 DIAGNOSIS — E039 Hypothyroidism, unspecified: Secondary | ICD-10-CM | POA: Diagnosis not present

## 2017-12-17 DIAGNOSIS — E1122 Type 2 diabetes mellitus with diabetic chronic kidney disease: Secondary | ICD-10-CM

## 2017-12-17 MED ORDER — INSULIN PEN NEEDLE 31G X 8 MM MISC: 1.0000 | 3 refills | Status: AC

## 2017-12-17 MED ORDER — SEMAGLUTIDE(0.25 OR 0.5MG/DOS) 2 MG/1.5ML ~~LOC~~ SOPN: 0.5000 mg | PEN_INJECTOR | SUBCUTANEOUS | 3 refills | Status: DC

## 2017-12-17 NOTE — Progress Notes (Signed)
Follow-up note       12/17/2017, 1:25 PM   Subjective:    Patient ID: [name], female    DOB: Jan 14, 1941.  [name] is being seen in follow-up for management of currently uncontrolled symptomatic type 2 diabetes comp located by stage  3-4 renal insufficiency, hyperlipidemia, hypertension. PMD :  Gareth Morgan, MD.   Past Medical History:  Diagnosis Date  . Anxiety   . Arthritis   . Asthma   . Carotid artery aneurysm [facility])    Right s/p endovascular treatment 2004  . Carpal tunnel syndrome   . Cataract   . CKD (chronic kidney disease) stage 3, GFR 30-59 ml/min (HCC)   . Depression   . Essential hypertension, benign   . GERD (gastroesophageal reflux disease)   . Glaucoma   . History of recurrent UTIs   . Hx of colonic polyps   . Hx of migraines   . Hypothyroidism   . IBS (irritable bowel syndrome)   . Low back pain   . Morbid obesity (HCC)   . Neurogenic bladder   . Osteoporosis   . Pneumonia   . Sleep apnea    Intolerant to CPAP   . Stroke (HCC)   . Type 2 diabetes mellitus with diabetic neuropathy [facility])    Past Surgical History:  Procedure Laterality Date  . ABDOMINAL HYSTERECTOMY    . CARDIAC CATHETERIZATION     1980 or 1990  . Carpal tunnel release - bilateral  1992  . CATARACT EXTRACTION W/ INTRAOCULAR LENS IMPLANT Bilateral   . CEREBRAL ANEURYSM REPAIR  2004   Coil   . COLONOSCOPY N/A 03/04/2013   Dr. Darrick Penna: moderate diverticulosis, moderate internal hemorrhoids. Next colonoscopy in 5-10 years with history of simple adenomas.  . COLONOSCOPY  06/14/2006   ZOX:WRUEAV ADENOMAS(2), pTICS, Ivy lipoma  . ESOPHAGOGASTRODUODENOSCOPY  08/12/2007   WUJ:[id number] esophagus without evidence of Barrett's,mass, erosion ulceration or stricture.  The GE junction was at 40 cm.  The Bravo  capsule was placed at 34 cm/ Unable to appreciate a hiatal hernia in the retroflexed viewNormal duodenal  bulb and second portion of the duodenum. mild gastritis.   Marland Kitchen EYE SURGERY    . SHOULDER SURGERY     Rotator cuff repair, 2012  . SPINAL CORD STIMULATOR INSERTION    . Stomach surgery gastropexy for gastric volvulus  2009  . Total hip replacement - right  2002  . TOTAL SHOULDER ARTHROPLASTY Left 01/30/2017   Procedure: LEFT TOTAL SHOULDER ARTHROPLASTY;  Surgeon: Cammy Copa, MD;  Location: [facility] OR;  Service: Orthopedics;  Laterality: Left;  . TUBAL LIGATION    . Tubular Adenoma    . VESICOVAGINAL FISTULA CLOSURE W/ TAH     Social History   Socioeconomic History  . Marital status: Legally Separated    Spouse name: Not on file  . Number of children: Not on file  . Years of education: 12th grade  . Highest education level: Not on file  Occupational History  . Occupation: Retired    Comment: Building surveyor: RETIRED  Social Needs  . Financial resource strain: Not on file  . Food  insecurity:    Worry: Not on file    Inability: Not on file  . Transportation needs:    Medical: Not on file    Non-medical: Not on file  Tobacco Use  . Smoking status: Former Smoker    Types: Cigarettes  . Smokeless tobacco: Never Used  Substance and Sexual Activity  . Alcohol use: No  . Drug use: No  . Sexual activity: Not on file  Lifestyle  . Physical activity:    Days per week: Not on file    Minutes per session: Not on file  . Stress: Not on file  Relationships  . Social connections:    Talks on phone: Not on file    Gets together: Not on file    Attends religious service: Not on file    Active member of club or organization: Not on file    Attends meetings of clubs or organizations: Not on file    Relationship status: Not on file  Other Topics Concern  . Not on file  Social History Narrative  . Not on file   Outpatient Encounter Medications as of 12/17/2017  Medication Sig  . Cholecalciferol (VITAMIN D3) 5000 units CAPS Take 5,000 Units by mouth daily.  Marland Kitchen.  dicyclomine (BENTYL) 10 MG capsule Take 1 capsule 2-3 times daily 30 minutes before a meal for abdominal cramping and loose stools. (Patient taking differently: Take 10 mg by mouth 3 (three) times daily as needed for spasms. )  . doxepin (SINEQUAN) 25 MG capsule Take 75 mg by mouth at bedtime.   . Fluticasone-Salmeterol (ADVAIR) 250-50 MCG/DOSE AEPB Inhale 1 puff into the lungs 2 (two) times daily as needed (for shortness of breath).  . gabapentin (NEURONTIN) 100 MG capsule TAKE ONE CAPSULE BY MOUTH 2 TIMES A DAY AS NEEDED FOR PAIN.  . glipiZIDE (GLUCOTROL) 5 MG tablet Take 1 tablet (5 mg total) by mouth daily before breakfast.  . guaiFENesin (MUCINEX) 600 MG 12 hr tablet Take 600 mg by mouth 2 (two) times daily as needed for cough or to loosen phlegm.  . Insulin Pen Needle (B-D ULTRAFINE III SHORT PEN) 31G X 8 MM MISC 1 each by Does not apply route as directed.  Marland Kitchen. levothyroxine (SYNTHROID, LEVOTHROID) 75 MCG tablet Take 1 tablet (75 mcg total) by mouth daily.  Marland Kitchen. omeprazole (PRILOSEC) 20 MG capsule Take 20 mg by mouth 2 (two) times daily.   Marland Kitchen. PROAIR HFA 108 (90 Base) MCG/ACT inhaler Inhale 2 puffs into the lungs every 6 (six) hours as needed for wheezing.   . Semaglutide,0.25 or 0.5MG /DOS, (OZEMPIC, 0.25 OR 0.5 MG/DOSE,) 2 MG/1.5ML SOPN Inject 0.5 mg into the skin once a week.  . [DISCONTINUED] TRADJENTA 5 MG TABS tablet TAKE 1 TABLET BY MOUTH  DAILY   Facility-Administered Encounter Medications as of 12/17/2017  Medication  . vancomycin (VANCOCIN) IVPB 1000 mg/200 mL premix    ALLERGIES: Allergies  Allergen Reactions  . Oxycodone Anaphylaxis and Itching  . Codeine Rash  . Hydrocodone Itching and Nausea And Vomiting  . Naproxen Nausea And Vomiting and Other (See Comments)    Hallucinations    VACCINATION STATUS: Immunization History  Administered Date(s) Administered  . Influenza Whole 12/25/2007  . Pneumococcal Polysaccharide-23 12/25/2007    Diabetes  She presents for her  follow-up diabetic visit. She has type 2 diabetes mellitus. Onset time: She was diagnosed at approximate age of 77 years.. Her disease course has been improving. There are no hypoglycemic associated symptoms. Pertinent negatives for  hypoglycemia include no confusion, headaches, pallor or seizures. Associated symptoms include fatigue. Pertinent negatives for diabetes include no chest pain, no polydipsia, no polyphagia and no polyuria. There are no hypoglycemic complications. Symptoms are improving. Diabetic complications include nephropathy. Risk factors for coronary artery disease include diabetes mellitus, dyslipidemia, family history, hypertension, obesity, sedentary lifestyle and tobacco exposure. Current diabetic treatment includes oral agent (monotherapy). Her weight is fluctuating minimally. She is following a generally unhealthy diet. When asked about meal planning, she reported none. She has not had a previous visit with a dietitian. She never participates in exercise. Her breakfast blood glucose range is generally 130-140 mg/dl. Her bedtime blood glucose range is generally 130-140 mg/dl. Her overall blood glucose range is 130-140 mg/dl. An ACE inhibitor/angiotensin II receptor blocker is being taken. Eye exam is current.  Hyperlipidemia  This is a chronic problem. The current episode started more than 1 year ago. The problem is uncontrolled. Recent lipid tests were reviewed and are high. Exacerbating diseases include diabetes, hypothyroidism and obesity. Pertinent negatives include no chest pain, myalgias or shortness of breath. She is currently on no antihyperlipidemic treatment. Risk factors for coronary artery disease include diabetes mellitus, dyslipidemia, hypertension, obesity, a sedentary lifestyle and post-menopausal.  Hypertension  This is a chronic problem. The current episode started more than 1 year ago. The problem is controlled. Pertinent negatives include no chest pain, headaches,  palpitations or shortness of breath. Risk factors for coronary artery disease include diabetes mellitus, dyslipidemia, obesity, sedentary lifestyle, smoking/tobacco exposure and post-menopausal state. Past treatments include angiotensin blockers and diuretics.    Review of Systems  Constitutional: Positive for fatigue. Negative for chills, fever and unexpected weight change.  HENT: Negative for trouble swallowing and voice change.   Eyes: Negative for visual disturbance.  Respiratory: Negative for cough, shortness of breath and wheezing.   Cardiovascular: Negative for chest pain, palpitations and leg swelling.  Gastrointestinal: Negative for diarrhea, nausea and vomiting.  Endocrine: Negative for cold intolerance, heat intolerance, polydipsia, polyphagia and polyuria.  Musculoskeletal: Positive for gait problem. Negative for arthralgias and myalgias.  Skin: Negative for color change, pallor, rash and wound.  Neurological: Negative for seizures and headaches.  Psychiatric/Behavioral: Negative for confusion and suicidal ideas.    Objective:    BP (!) 148/87   Pulse 73   Ht 5' (1.524 m)   Wt 213 lb (96.6 kg)   BMI 41.60 kg/m   Wt Readings from Last 3 Encounters:  12/17/17 213 lb (96.6 kg)  12/13/17 215 lb (97.5 kg)  11/26/17 215 lb (97.5 kg)     Physical Exam  Constitutional: She is oriented to person, place, and time. She appears well-developed.  Uses a cane to walk due to recent hip replacement and diffuse joint arthritis.   HENT:  Head: Normocephalic and atraumatic.  Eyes: EOM are normal.  Neck: Normal range of motion. Neck supple. No tracheal deviation present. No thyromegaly present.  Cardiovascular: Normal rate and regular rhythm.  Pulmonary/Chest: Effort normal and breath sounds normal.  Abdominal: Soft. Bowel sounds are normal. There is no tenderness. There is no guarding.  Musculoskeletal: She exhibits no edema.  Neurological: She is alert and oriented to person,  place, and time. She has normal reflexes. No cranial nerve deficit. Coordination normal.  Skin: Skin is warm and dry. No rash noted. No erythema. No pallor.  Psychiatric: She has a normal mood and affect. Judgment normal.    CMP ( most recent) CMP     Component Value Date/Time  NA 139 12/10/2017 1312   NA 142 07/10/2012 1135   K 5.1 12/10/2017 1312   K 4.4 07/10/2012 1135   CL 104 12/10/2017 1312   CL 111 (H) 07/10/2012 1135   CO2 27 12/10/2017 1312   CO2 25 07/10/2012 1135   GLUCOSE 111 (H) 12/10/2017 1312   GLUCOSE 148 (H) 07/10/2012 1135   BUN 8 12/10/2017 1312   BUN 22 (H) 07/10/2012 1135   CREATININE 1.61 (H) 12/10/2017 1312   CALCIUM 9.0 12/10/2017 1312   CALCIUM 8.8 07/10/2012 1135   PROT 7.3 12/10/2017 1312   ALBUMIN 4.0 10/23/2017 1422   AST 24 12/10/2017 1312   ALT 19 12/10/2017 1312   ALKPHOS 98 10/23/2017 1422   BILITOT 0.3 12/10/2017 1312   GFRNONAA 30 (L) 12/10/2017 1312   GFRAA 35 (L) 12/10/2017 1312     Diabetic Labs (most recent): Lab Results  Component Value Date   HGBA1C 7.0 (H) 12/10/2017   HGBA1C 6.4 (H) 08/08/2017   HGBA1C 6.6 (H) 05/09/2017     Lipid Panel ( most recent) Lipid Panel     Component Value Date/Time   CHOL 171 05/09/2017 1536   TRIG 120 05/09/2017 1536   HDL 62 05/09/2017 1536   CHOLHDL 2.8 05/09/2017 1536   VLDL 22 06/23/2008 2159   LDLCALC 87 05/09/2017 1536      Lab Results  Component Value Date   TSH 1.19 12/10/2017   TSH 0.73 08/08/2017   TSH 0.31 (L) 05/09/2017   TSH 2.886 11/20/2007   TSH 1.589 11/06/2007   FREET4 1.0 12/10/2017   FREET4 1.1 08/08/2017   FREET4 1.1 05/09/2017      Assessment & Plan:   1. Type 2 diabetes mellitus with stage 3 chronic kidney disease, without long-term current use of insulin (HCC)  - [name] has currently uncontrolled symptomatic type 2 DM since  77 years of age. -She came with controlled glycemic profile to target, A1c is stable at 7%, overall improving from  8.1%.    -Recent labs reviewed.  -her diabetes is complicated by stage 3 renal insufficiency, obesity/sedentary life, history of smoking and [name] remains at a high risk for more acute and chronic complications which include CAD, CVA, CKD, retinopathy, and neuropathy. These are all discussed in detail with the patient.  - I have counseled her on diet management and weight loss, by adopting a carbohydrate restricted/protein rich diet.  -  Suggestion is made for her to avoid simple carbohydrates  from her diet including Cakes, Sweet Desserts / Pastries, Ice Cream, Soda (diet and regular), Sweet Tea, Candies, Chips, Cookies, Store Bought Juices, Alcohol in Excess of  1-2 drinks a day, Artificial Sweeteners, and "Sugar-free" Products. This will help patient to have stable blood glucose profile and potentially avoid unintended weight gain.  - I encouraged her to switch to  unprocessed or minimally processed complex starch and increased protein intake (animal or plant source), fruits, and vegetables.  - she is advised to stick to a routine mealtimes to eat 3 meals  a day and avoid unnecessary snacks ( to snack only to correct hypoglycemia).   - she has been scheduled with Norm Salt, RDN, CDE for individualized diabetes education- consult pending.  - I have approached her with the following individualized plan to manage diabetes and patient agrees:   -Based on her presentation with controlled glycemic profile and A1c at 7 %, she will not require insulin treatment at this time.    -  However, patient could not afford the co-pays for Tradjenta and she discontinued.  -Is not a suitable candidate for SGLT2 inhibitors nor metformin therapy.  -She has requested a prescription for Ozempic which is provided to her.  -I discussed and initiated on Ozempic 0.5 mg subcutaneously weekly with a sample from clinic.  -For lack of better options, I advised her to continue glipizide 5 mg p.o. daily with  breakfast. -She is asked to monitor blood glucose 2 times daily -before breakfast and at bedtime.  -Patient is encouraged to call clinic for blood glucose levels less than 70 or above 300 mg /dl.  - She is status post evaluation by nephrologist and urologist.  - Patient specific target  A1c;  LDL, HDL, Triglycerides, and  Waist Circumference were discussed in detail.  2) BP/HTN: Her blood pressure is not controlled to target.   I advised her to continue her current blood pressure medications including Cozaar 25 mg p.o. Daily.  3) Lipids/HPL: Her recent lipid panel showed controlled LDL at 87, proving from 103. -She did not bring her medications for review today.  Her medication list  does not include statin. Patient is advised to bring all her medications next visit.  4)  Weight/Diet: CDE Consult is progress,  exercise, and detailed carbohydrates information provided.  5) Hypothyroidism - Etiology unclear.  Her thyroid function tests are consistent with appropriate replacement.  I advised her to continue levothyroxine 75 mcg p.o. every morning.  - We discussed about correct intake of levothyroxine, at fasting, with water, separated by at least 30 minutes from breakfast, and separated by more than 4 hours from calcium, iron, multivitamins, acid reflux medications (PPIs). -Patient is made aware of the fact that thyroid hormone replacement is needed for life, dose to be adjusted by periodic monitoring of thyroid function tests.  6) Chronic Care/Health Maintenance:  -she  is on ARB   is encouraged to continue to follow up with Ophthalmology, Dentist,  Podiatrist at least yearly or according to recommendations, and advised to   stay away from smoking. I have recommended yearly flu vaccine and pneumonia vaccination at least every 5 years; moderate intensity exercise for up to 150 minutes weekly; and  sleep for at least 7 hours a day.  - I advised patient to maintain close follow up with Gareth Morgan, MD for primary care needs.  - Time spent with the patient: 25 min, of which >50% was spent in reviewing her blood glucose logs , discussing her hypo- and hyper-glycemic episodes, reviewing her current and  previous labs and insulin doses and developing a plan to avoid hypo- and hyper-glycemia. Please refer to Patient Instructions for Blood Glucose Monitoring and Insulin/Medications Dosing Guide"  in media tab for additional information. [name] participated in the discussions, expressed understanding, and voiced agreement with the above plans.  All questions were answered to her satisfaction. she is encouraged to contact clinic should she have any questions or concerns prior to her return visit.\   Follow up plan: - Return in about 4 months (around 04/18/2018) for Follow up with Pre-visit Labs, Meter, and Logs.  Marquis Lunch, MD [facility] [address] [place], [place] [address] Phone: [phone]  Fax: [phone]    12/17/2017, 1:25 PM  This note was partially dictated with voice recognition software. Similar sounding words can be transcribed inadequately or may not  be corrected upon review.

## 2017-12-17 NOTE — Patient Instructions (Signed)

## 2017-12-31 ENCOUNTER — Other Ambulatory Visit: Payer: Self-pay | Admitting: "Endocrinology

## 2018-01-02 ENCOUNTER — Other Ambulatory Visit: Payer: Self-pay

## 2018-01-02 ENCOUNTER — Encounter: Payer: Self-pay | Admitting: Pain Medicine

## 2018-01-02 ENCOUNTER — Ambulatory Visit: Payer: Medicare Other | Attending: Pain Medicine | Admitting: Pain Medicine

## 2018-01-02 VITALS — BP 180/84 | HR 90 | Temp 98.4°F | Ht 60.0 in | Wt 221.0 lb

## 2018-01-02 DIAGNOSIS — M48061 Spinal stenosis, lumbar region without neurogenic claudication: Secondary | ICD-10-CM

## 2018-01-02 DIAGNOSIS — M25562 Pain in left knee: Secondary | ICD-10-CM

## 2018-01-02 DIAGNOSIS — M51379 Other intervertebral disc degeneration, lumbosacral region without mention of lumbar back pain or lower extremity pain: Secondary | ICD-10-CM | POA: Insufficient documentation

## 2018-01-02 DIAGNOSIS — Z79899 Other long term (current) drug therapy: Secondary | ICD-10-CM | POA: Diagnosis not present

## 2018-01-02 DIAGNOSIS — G894 Chronic pain syndrome: Secondary | ICD-10-CM | POA: Insufficient documentation

## 2018-01-02 DIAGNOSIS — M79605 Pain in left leg: Secondary | ICD-10-CM | POA: Diagnosis present

## 2018-01-02 DIAGNOSIS — M47816 Spondylosis without myelopathy or radiculopathy, lumbar region: Secondary | ICD-10-CM | POA: Insufficient documentation

## 2018-01-02 DIAGNOSIS — K589 Irritable bowel syndrome without diarrhea: Secondary | ICD-10-CM | POA: Diagnosis not present

## 2018-01-02 DIAGNOSIS — M5134 Other intervertebral disc degeneration, thoracic region: Secondary | ICD-10-CM | POA: Diagnosis not present

## 2018-01-02 DIAGNOSIS — Z90710 Acquired absence of both cervix and uterus: Secondary | ICD-10-CM | POA: Insufficient documentation

## 2018-01-02 DIAGNOSIS — Z8249 Family history of ischemic heart disease and other diseases of the circulatory system: Secondary | ICD-10-CM | POA: Insufficient documentation

## 2018-01-02 DIAGNOSIS — Z794 Long term (current) use of insulin: Secondary | ICD-10-CM | POA: Diagnosis not present

## 2018-01-02 DIAGNOSIS — Z886 Allergy status to analgesic agent status: Secondary | ICD-10-CM | POA: Insufficient documentation

## 2018-01-02 DIAGNOSIS — G8929 Other chronic pain: Secondary | ICD-10-CM

## 2018-01-02 DIAGNOSIS — M5137 Other intervertebral disc degeneration, lumbosacral region: Secondary | ICD-10-CM | POA: Diagnosis not present

## 2018-01-02 DIAGNOSIS — Z885 Allergy status to narcotic agent status: Secondary | ICD-10-CM | POA: Insufficient documentation

## 2018-01-02 DIAGNOSIS — E1122 Type 2 diabetes mellitus with diabetic chronic kidney disease: Secondary | ICD-10-CM | POA: Diagnosis not present

## 2018-01-02 DIAGNOSIS — M25552 Pain in left hip: Secondary | ICD-10-CM

## 2018-01-02 DIAGNOSIS — R937 Abnormal findings on diagnostic imaging of other parts of musculoskeletal system: Secondary | ICD-10-CM | POA: Insufficient documentation

## 2018-01-02 DIAGNOSIS — M25561 Pain in right knee: Secondary | ICD-10-CM

## 2018-01-02 DIAGNOSIS — M25551 Pain in right hip: Secondary | ICD-10-CM

## 2018-01-02 DIAGNOSIS — M170 Bilateral primary osteoarthritis of knee: Secondary | ICD-10-CM | POA: Diagnosis not present

## 2018-01-02 DIAGNOSIS — F329 Major depressive disorder, single episode, unspecified: Secondary | ICD-10-CM | POA: Insufficient documentation

## 2018-01-02 DIAGNOSIS — I129 Hypertensive chronic kidney disease with stage 1 through stage 4 chronic kidney disease, or unspecified chronic kidney disease: Secondary | ICD-10-CM | POA: Insufficient documentation

## 2018-01-02 DIAGNOSIS — Z8673 Personal history of transient ischemic attack (TIA), and cerebral infarction without residual deficits: Secondary | ICD-10-CM | POA: Diagnosis not present

## 2018-01-02 DIAGNOSIS — N183 Chronic kidney disease, stage 3 (moderate): Secondary | ICD-10-CM | POA: Diagnosis not present

## 2018-01-02 DIAGNOSIS — Z96641 Presence of right artificial hip joint: Secondary | ICD-10-CM | POA: Insufficient documentation

## 2018-01-02 DIAGNOSIS — K219 Gastro-esophageal reflux disease without esophagitis: Secondary | ICD-10-CM | POA: Diagnosis not present

## 2018-01-02 DIAGNOSIS — Z86010 Personal history of colonic polyps: Secondary | ICD-10-CM | POA: Insufficient documentation

## 2018-01-02 DIAGNOSIS — M1612 Unilateral primary osteoarthritis, left hip: Secondary | ICD-10-CM | POA: Diagnosis not present

## 2018-01-02 DIAGNOSIS — G5600 Carpal tunnel syndrome, unspecified upper limb: Secondary | ICD-10-CM | POA: Diagnosis not present

## 2018-01-02 DIAGNOSIS — Z9889 Other specified postprocedural states: Secondary | ICD-10-CM | POA: Diagnosis not present

## 2018-01-02 NOTE — Progress Notes (Signed)
Patient's Name: [name]  MRN: [id number]  Referring Provider: Lemmie Evens, MD  DOB: [birth year]  PCP: Lemmie Evens, MD  DOS: 01/02/2018  Note by: Gaspar Cola, MD  Service setting: Ambulatory outpatient  Specialty: Interventional Pain Management  Location: [facility] (AMB) Pain Management Facility    Patient type: Established   Primary Reason(s) for Visit: Encounter for post-procedure evaluation of chronic illness with mild to moderate exacerbation CC: Leg Pain  HPI  [name] is a 77 y.o. year old, female patient, who comes today for a post-procedure evaluation. She has Type 2 diabetes mellitus with stage 3 chronic kidney disease, without long-term current use of insulin ([place]); OBESITY, MORBID; Anxiety state; Depression; Essential hypertension, benign; ASTHMA; GERD; IBS; CKD (chronic kidney disease) stage 3, GFR 30-59 ml/min (HCC); ARTHRITIS; Abdominal pain; Asthma; PVC's (premature ventricular contractions); Osteoarthritis of knee (Bilateral); Chronic knee pain (Tertiary Area of Pain) (Bilateral) (L>R); Chronic pain; Spinal cord stimulator implant; Shoulder arthritis; AKI (acute kidney injury) ([place]); Hypothyroidism; Arthropathy; Chronic lower extremity pain (Primary Area of Pain) (Left); Chronic hip pain (Secondary Area of Pain) (Bilateral) (R>L); Chronic pain syndrome; Pharmacologic therapy; Disorder of skeletal system; Problems influencing health status; S/P hip replacement (Right); Chronic hip pain (Left); Osteoarthritis of hip (Left); History of attempted suicide (w/ Medications); Chronic hip pain after total replacement (Right); Lumbar Grade 1 Anterolisthesis (44m) of L3 over L4; DDD (degenerative disc disease), thoracic; DDD (degenerative disc disease), lumbosacral; Lumbar facet arthropathy (Bilateral); Lumbar facet syndrome (Bilateral); Abnormal CT/myelogram of  Lumbar spine (07/23/2017); and Lumbar foraminal stenosis (Right: L5-S1) (Bilateral: L2-3, L3-4, L4-5) (L>R) on their  problem list. Her primarily concern today is the Leg Pain  Pain Assessment: Location: Right, Left Leg Radiating: radiaties down left leg Onset: More than a month ago Duration: Chronic pain Quality: Aching, Sharp, Shooting Severity: 6 /10 (subjective, self-reported pain score)  Note: Reported level is compatible with observation.                         When using our objective Pain Scale, levels between 6 and 10/10 are said to belong in an emergency room, as it progressively worsens from a 6/10, described as severely limiting, requiring emergency care not usually available at an outpatient pain management facility. At a 6/10 level, communication becomes difficult and requires great effort. Assistance to reach the emergency department may be required. Facial flushing and profuse sweating along with potentially dangerous increases in heart rate and blood pressure Shelby be evident. Effect on ADL: limits my daily activites Timing: Intermittent Modifying factors: laying and sleeping, medications BP: (!) 180/84  HR: 90  Ms. HSpeascomes in today for post-procedure evaluation after the treatment done on 12/14/2017.  Further details on both, my assessment(s), as well as the proposed treatment plan, please see below.  Post-Procedure Assessment  12/13/2017 Procedure: Diagnostic left intra-articular hip joint injection #1 under fluoroscopic guidance and IV sedation Pre-procedure pain score:  6/10 Post-procedure pain score: 0/10 (100% relief) Influential Factors: BMI: 43.16 kg/m Intra-procedural challenges: None observed.         Assessment challenges: None detected.              Reported side-effects: None.        Post-procedural adverse reactions or complications: None reported         Sedation: Sedation provided. When no sedatives are used, the analgesic levels obtained are directly associated to the effectiveness of the local anesthetics. However, when sedation is provided,  the level of  analgesia obtained during the initial 1 hour following the intervention, is believed to be the result of a combination of factors. These factors may include, but are not limited to: 1. The effectiveness of the local anesthetics used. 2. The effects of the analgesic(s) and/or anxiolytic(s) used. 3. The degree of discomfort experienced by the patient at the time of the procedure. 4. The patients ability and reliability in recalling and recording the events. 5. The presence and influence of possible secondary gains and/or psychosocial factors. Reported result: Relief experienced during the 1st hour after the procedure: 100 % (Ultra-Short Term Relief)            Interpretative annotation: Clinically appropriate result. Analgesia during this period is likely to be Local Anesthetic and/or IV Sedative (Analgesic/Anxiolytic) related.          Effects of local anesthetic: The analgesic effects attained during this period are directly associated to the localized infiltration of local anesthetics and therefore cary significant diagnostic value as to the etiological location, or anatomical origin, of the pain. Expected duration of relief is directly dependent on the pharmacodynamics of the local anesthetic used. Long-acting (4-6 hours) anesthetics used.  Reported result: Relief during the next 4 to 6 hour after the procedure: 100 %(lasted for three daqys) (Short-Term Relief)            Interpretative annotation: Clinically appropriate result. Analgesia during this period is likely to be Local Anesthetic-related.          Long-term benefit: Defined as the period of time past the expected duration of local anesthetics (1 hour for short-acting and 4-6 hours for long-acting). With the possible exception of prolonged sympathetic blockade from the local anesthetics, benefits during this period are typically attributed to, or associated with, other factors such as analgesic sensory neuropraxia, antiinflammatory effects,  or beneficial biochemical changes provided by agents other than the local anesthetics.  Reported result: Extended relief following procedure: 100 % (Long-Term Relief)            Interpretative annotation: Clinically possible results. Good relief. No permanent benefit expected. Inflammation plays a part in the etiology to the pain.          Current benefits: Defined as reported results that persistent at this point in time.   Analgesia: 75-100 % Ms. Fleer reports improvement of arthralgia. Function: Ms. Hetzer reports improvement in function ROM: Ms. Wrobleski reports improvement in ROM Interpretative annotation: Ongoing benefit. Therapeutic benefit observed. Effective therapeutic approach.          Interpretation: Results would suggest a successful diagnostic intervention.                  Plan:  Please see "Plan of Care" for details.                Laboratory Chemistry  Inflammation Markers (CRP: Acute Phase) (ESR: Chronic Phase) Lab Results  Component Value Date   CRP 1.5 (H) 10/23/2017   ESRSEDRATE 45 (H) 10/23/2017   LATICACIDVEN 1.17 11/04/2014                         Renal Markers Lab Results  Component Value Date   BUN 8 12/10/2017   CREATININE 1.61 (H) 12/10/2017   BCR 5 (L) 12/10/2017   GFRAA 35 (L) 12/10/2017   GFRNONAA 30 (L) 12/10/2017  Hepatic Markers Lab Results  Component Value Date   AST 24 12/10/2017   ALT 19 12/10/2017   ALBUMIN 4.0 10/23/2017   HCVAB NEG 02/15/2010                        Neuropathy Markers Lab Results  Component Value Date   VITAMINB12 234 10/23/2017   HGBA1C 7.0 (H) 12/10/2017                        Hematology Parameters Lab Results  Component Value Date   INR 1.01 07/19/2010   LABPROT 13.5 07/19/2010   APTT 29 07/19/2010   PLT 342 02/14/2017   HGB 10.4 (L) 02/14/2017   HCT 33.5 (L) 02/14/2017                        CV Markers Lab Results  Component Value Date   BNP 53.0 02/20/2015                          Note: Lab results reviewed.  Recent Imaging Results   Results for orders placed in visit on 12/13/17  DG C-Arm 1-60 Min-No Report   Narrative Fluoroscopy was utilized by the requesting physician.  No radiographic  interpretation.    Interpretation Report: Fluoroscopy was used during the procedure to assist with needle guidance. The images were interpreted intraoperatively by the requesting physician.  Meds   Current Outpatient Medications:  .  Cholecalciferol (VITAMIN D3) 5000 units CAPS, Take 5,000 Units by mouth daily., Disp: , Rfl:  .  dicyclomine (BENTYL) 10 MG capsule, Take 1 capsule 2-3 times daily 30 minutes before a meal for abdominal cramping and loose stools. (Patient taking differently: Take 10 mg by mouth 3 (three) times daily as needed for spasms. ), Disp: 270 capsule, Rfl: 1 .  doxepin (SINEQUAN) 25 MG capsule, Take 75 mg by mouth at bedtime. , Disp: , Rfl:  .  Fluticasone-Salmeterol (ADVAIR) 250-50 MCG/DOSE AEPB, Inhale 1 puff into the lungs 2 (two) times daily as needed (for shortness of breath)., Disp: , Rfl:  .  gabapentin (NEURONTIN) 100 MG capsule, TAKE ONE CAPSULE BY MOUTH 2 TIMES A DAY AS NEEDED FOR PAIN., Disp: 60 capsule, Rfl: 0 .  glipiZIDE (GLUCOTROL) 5 MG tablet, TAKE 1 TABLET DAILY BEFORE BREAKFAST., Disp: 30 tablet, Rfl: 2 .  guaiFENesin (MUCINEX) 600 MG 12 hr tablet, Take 600 mg by mouth 2 (two) times daily as needed for cough or to loosen phlegm., Disp: , Rfl:  .  Insulin Pen Needle (B-D ULTRAFINE III SHORT PEN) 31G X 8 MM MISC, 1 each by Does not apply route as directed., Disp: 50 each, Rfl: 3 .  levothyroxine (SYNTHROID, LEVOTHROID) 75 MCG tablet, Take 1 tablet (75 mcg total) by mouth daily., Disp: 90 tablet, Rfl: 0 .  omeprazole (PRILOSEC) 20 MG capsule, Take 20 mg by mouth 2 (two) times daily. , Disp: , Rfl:  .  PROAIR HFA 108 (90 Base) MCG/ACT inhaler, Inhale 2 puffs into the lungs every 6 (six) hours as needed for wheezing. , Disp: ,  Rfl:  .  Semaglutide,0.25 or 0.5MG/DOS, (OZEMPIC, 0.25 OR 0.5 MG/DOSE,) 2 MG/1.5ML SOPN, Inject 0.5 mg into the skin once a week., Disp: 1 pen, Rfl: 3 No current facility-administered medications for this visit.   Facility-Administered Medications Ordered in Other Visits:  .  vancomycin (VANCOCIN) IVPB 1000 mg/200  mL premix, 1,000 mg, Intravenous, 60 min Pre-Op, Dean, Tonna Corner, MD  ROS  Constitutional: Denies any fever or chills Gastrointestinal: No reported hemesis, hematochezia, vomiting, or acute GI distress Musculoskeletal: Denies any acute onset joint swelling, redness, loss of ROM, or weakness Neurological: No reported episodes of acute onset apraxia, aphasia, dysarthria, agnosia, amnesia, paralysis, loss of coordination, or loss of consciousness  Allergies  Ms. Christoffersen is allergic to oxycodone; codeine; hydrocodone; and naproxen.  [place]  Drug: Ms. Guion  reports that she does not use drugs. Alcohol:  reports that she does not drink alcohol. Tobacco:  reports that she has quit smoking. Her smoking use included cigarettes. She has never used smokeless tobacco. Medical:  has a past medical history of Anxiety, Arthritis, Asthma, Carotid artery aneurysm ([place]), Carpal tunnel syndrome, Cataract, CKD (chronic kidney disease) stage 3, GFR 30-59 ml/min ([place]), Depression, Essential hypertension, benign, GERD (gastroesophageal reflux disease), Glaucoma, History of recurrent UTIs, colonic polyps, migraines, Hypothyroidism, IBS (irritable bowel syndrome), Low back pain, Morbid obesity ([place]), Neurogenic bladder, Osteoporosis, Pneumonia, Sleep apnea, Stroke ([place]), and Type 2 diabetes mellitus with diabetic neuropathy ([place]). Surgical: Ms. Fort  has a past surgical history that includes Vesicovaginal fistula closure w/ TAH; Carpal tunnel release - bilateral (1992); Total hip replacement - right (2002); Stomach surgery gastropexy for gastric volvulus (2009); Tubular Adenoma; Shoulder surgery;  Spinal cord stimulator insertion; Esophagogastroduodenoscopy (08/12/2007); Colonoscopy (N/A, 03/04/2013); Abdominal hysterectomy; Colonoscopy (06/14/2006); Cerebral aneurysm repair (2004); Eye surgery; Cataract extraction w/ intraocular lens implant (Bilateral); Cardiac catheterization; Tubal ligation; and Total shoulder arthroplasty (Left, 01/30/2017). Family: family history includes Asthma in her unknown relative; Cancer in her brother, brother, mother, and sister; Diabetes in her brother; Heart attack in her sister; Heart disease in her mother; Lung disease in her unknown relative.  Constitutional Exam  General appearance: Well nourished, well developed, and well hydrated. In no apparent acute distress Vitals:   01/02/18 1026  BP: (!) 180/84  Pulse: 90  Temp: 98.4 F (36.9 C)  SpO2: 100%  Weight: 221 lb (100.2 kg)  Height: 5' (1.524 m)   BMI Assessment: Estimated body mass index is 43.16 kg/m as calculated from the following:   Height as of this encounter: 5' (1.524 m).   Weight as of this encounter: 221 lb (100.2 kg).  BMI interpretation table: BMI level Category Range association with higher incidence of chronic pain  <18 kg/m2 Underweight   18.5-24.9 kg/m2 Ideal body weight   25-29.9 kg/m2 Overweight Increased incidence by 20%  30-34.9 kg/m2 Obese (Class I) Increased incidence by 68%  35-39.9 kg/m2 Severe obesity (Class II) Increased incidence by 136%  >40 kg/m2 Extreme obesity (Class III) Increased incidence by 254%   Patient's current BMI Ideal Body weight  Body mass index is 43.16 kg/m. Ideal body weight: 45.5 kg (100 lb 4.9 oz) Adjusted ideal body weight: 67.4 kg (148 lb 9.4 oz)   BMI Readings from Last 4 Encounters:  01/02/18 43.16 kg/m  12/17/17 41.60 kg/m  12/13/17 41.99 kg/m  11/26/17 41.99 kg/m   Wt Readings from Last 4 Encounters:  01/02/18 221 lb (100.2 kg)  12/17/17 213 lb (96.6 kg)  12/13/17 215 lb (97.5 kg)  11/26/17 215 lb (97.5 kg)  Psych/Mental  status: Alert, oriented x 3 (person, place, & time)       Eyes: PERLA Respiratory: No evidence of acute respiratory distress  Cervical Spine Area Exam  Skin & Axial Inspection: No masses, redness, edema, swelling, or associated skin lesions Alignment: Symmetrical Functional ROM: Unrestricted  ROM      Stability: No instability detected Muscle Tone/Strength: Functionally intact. No obvious neuro-muscular anomalies detected. Sensory (Neurological): Unimpaired Palpation: No palpable anomalies              Upper Extremity (UE) Exam    Side: Right upper extremity  Side: Left upper extremity  Skin & Extremity Inspection: Skin color, temperature, and hair growth are WNL. No peripheral edema or cyanosis. No masses, redness, swelling, asymmetry, or associated skin lesions. No contractures.  Skin & Extremity Inspection: Skin color, temperature, and hair growth are WNL. No peripheral edema or cyanosis. No masses, redness, swelling, asymmetry, or associated skin lesions. No contractures.  Functional ROM: Unrestricted ROM          Functional ROM: Unrestricted ROM          Muscle Tone/Strength: Functionally intact. No obvious neuro-muscular anomalies detected.  Muscle Tone/Strength: Functionally intact. No obvious neuro-muscular anomalies detected.  Sensory (Neurological): Unimpaired          Sensory (Neurological): Unimpaired          Palpation: No palpable anomalies              Palpation: No palpable anomalies              Provocative Test(s):  Phalen's test: deferred Tinel's test: deferred Apley's scratch test (touch opposite shoulder):  Action 1 (Across chest): deferred Action 2 (Overhead): deferred Action 3 (LB reach): deferred   Provocative Test(s):  Phalen's test: deferred Tinel's test: deferred Apley's scratch test (touch opposite shoulder):  Action 1 (Across chest): deferred Action 2 (Overhead): deferred Action 3 (LB reach): deferred    Thoracic Spine Area Exam  Skin & Axial  Inspection: No masses, redness, or swelling Alignment: Symmetrical Functional ROM: Unrestricted ROM Stability: No instability detected Muscle Tone/Strength: Functionally intact. No obvious neuro-muscular anomalies detected. Sensory (Neurological): Unimpaired Muscle strength & Tone: No palpable anomalies  Lumbar Spine Area Exam  Skin & Axial Inspection: No masses, redness, or swelling Alignment: Symmetrical Functional ROM: Unrestricted ROM       Stability: No instability detected Muscle Tone/Strength: Functionally intact. No obvious neuro-muscular anomalies detected. Sensory (Neurological): Unimpaired Palpation: No palpable anomalies       Provocative Tests: Hyperextension/rotation test: deferred today       Lumbar quadrant test (Kemp's test): deferred today       Lateral bending test: deferred today       Patrick's Maneuver: deferred today                   FABER test: deferred today                   S-I anterior distraction/compression test: deferred today         S-I lateral compression test: deferred today         S-I Thigh-thrust test: deferred today         S-I Gaenslen's test: deferred today          Gait & Posture Assessment  Ambulation: Unassisted Gait: Relatively normal for age and body habitus Posture: WNL   Lower Extremity Exam    Side: Right lower extremity  Side: Left lower extremity  Stability: No instability observed          Stability: No instability observed          Skin & Extremity Inspection: Skin color, temperature, and hair growth are WNL. No peripheral edema or cyanosis. No masses, redness, swelling, asymmetry, or associated  skin lesions. No contractures.  Skin & Extremity Inspection: Skin color, temperature, and hair growth are WNL. No peripheral edema or cyanosis. No masses, redness, swelling, asymmetry, or associated skin lesions. No contractures.  Functional ROM: Unrestricted ROM                  Functional ROM: Unrestricted ROM                   Muscle Tone/Strength: Functionally intact. No obvious neuro-muscular anomalies detected.  Muscle Tone/Strength: Functionally intact. No obvious neuro-muscular anomalies detected.  Sensory (Neurological): Unimpaired  Sensory (Neurological): Unimpaired  Palpation: No palpable anomalies  Palpation: No palpable anomalies   Assessment  Primary Diagnosis & Pertinent Problem List: The primary encounter diagnosis was Chronic lower extremity pain (Primary Area of Pain) (Left). Diagnoses of DDD (degenerative disc disease), lumbosacral, Lumbar foraminal stenosis (Right: L5-S1) (Bilateral: L2-3, L3-4, L4-5) (L>R), Chronic hip pain (Secondary Area of Pain) (Bilateral) (R>L), Chronic knee pain (Tertiary Area of Pain) (Bilateral) (L>R), Osteoarthritis of knee (Bilateral), DDD (degenerative disc disease), thoracic, Lumbar facet arthropathy (Bilateral), Lumbar facet syndrome (Bilateral), Abnormal CT/myelogram of  Lumbar spine (07/23/2017), and OBESITY, MORBID were also pertinent to this visit.  Status Diagnosis  Controlled Controlled Controlled 1. Chronic lower extremity pain (Primary Area of Pain) (Left)   2. DDD (degenerative disc disease), lumbosacral   3. Lumbar foraminal stenosis (Right: L5-S1) (Bilateral: L2-3, L3-4, L4-5) (L>R)   4. Chronic hip pain (Secondary Area of Pain) (Bilateral) (R>L)   5. Chronic knee pain (Tertiary Area of Pain) (Bilateral) (L>R)   6. Osteoarthritis of knee (Bilateral)   7. DDD (degenerative disc disease), thoracic   8. Lumbar facet arthropathy (Bilateral)   9. Lumbar facet syndrome (Bilateral)   10. Abnormal CT/myelogram of  Lumbar spine (07/23/2017)   11. OBESITY, MORBID     Problems updated and reviewed during this visit: No problems updated. Plan of Care  Pharmacotherapy (Medications Ordered): No orders of the defined types were placed in this encounter.  Medications administered today: Mercy H. Lesnick had no medications administered during this  visit.   Procedure Orders     KNEE INJECTION Lab Orders  No laboratory test(s) ordered today   Imaging Orders  No imaging studies ordered today    Referral Orders     Ambulatory referral to Orthopedic Surgery     Amb ref to Medical Nutrition Therapy-MNT Interventional management options: Planned, scheduled, and/or pending:   Therapeutic bilateral Hyalgan Knee injection #1 under fluoro, no sedation    Considering:   Diagnostic left transforaminal ESI  Diagnosticbilateral lumbar facet nerve block  Possible bilateral lumbar facet RFA  Diagnostic left intra-articular hip injection  Diagnostic right foraminal femoral and obturator nerve block  Diagnostic bilateral intra-articular knee injection  Diagnostic bilateral Hyalgan series    Palliative PRN treatment(s):   None at this time   Provider-requested follow-up: Return for Procedure (no sedation): (B) IA Hyalgan #1.  Future Appointments  Date Time Provider [place]  01/15/2018 12:30 PM Milinda Pointer, MD [facility] None  04/18/2018  1:30 PM Nida, Marella Chimes, MD [facility] None   Primary Care Physician: Lemmie Evens, MD Location: [facility] Outpatient Pain Management Facility Note by: Gaspar Cola, MD Date: 01/02/2018; Time: 12:24 PM

## 2018-01-02 NOTE — Patient Instructions (Addendum)
_____You have been given pre procedure instructions without sedation. _______________________________________________________________________________________  Pain Scale  Introduction: The pain score used by this practice is the Verbal Numerical Rating Scale (VNRS-11). This is an 11-point scale. It is for adults and children 10 years or older. There are significant differences in how the pain score is reported, used, and applied. Forget everything you learned in the past and learn this scoring system.  General Information: The scale should reflect your current level of pain. Unless you are specifically asked for the level of your worst pain, or your average pain. If you are asked for one of these two, then it should be understood that it is over the past 24 hours.  Basic Activities of Daily Living (ADL): Personal hygiene, dressing, eating, transferring, and using restroom.  Instructions: Most patients tend to report their level of pain as a combination of two factors, their physical pain and their psychosocial pain. This last one is also known as "suffering" and it is reflection of how physical pain affects you socially and psychologically. From now on, report them separately. From this point on, when asked to report your pain level, report only your physical pain. Use the following table for reference.  Pain Clinic Pain Levels (0-5/10)  Pain Level Score  Description  No Pain 0   Mild pain 1 Nagging, annoying, but does not interfere with basic activities of daily living (ADL). Patients are able to eat, bathe, get dressed, toileting (being able to get on and off the toilet and perform personal hygiene functions), transfer (move in and out of bed or a chair without assistance), and maintain continence (able to control bladder and bowel functions). Blood pressure and heart rate are unaffected. A normal heart rate for a healthy adult ranges from 60 to 100 bpm (beats per minute).   Mild to moderate pain  2 Noticeable and distracting. Impossible to hide from other people. More frequent flare-ups. Still possible to adapt and function close to normal. It can be very annoying and may have occasional stronger flare-ups. With discipline, patients may get used to it and adapt.   Moderate pain 3 Interferes significantly with activities of daily living (ADL). It becomes difficult to feed, bathe, get dressed, get on and off the toilet or to perform personal hygiene functions. Difficult to get in and out of bed or a chair without assistance. Very distracting. With effort, it can be ignored when deeply involved in activities.   Moderately severe pain 4 Impossible to ignore for more than a few minutes. With effort, patients may still be able to manage work or participate in some social activities. Very difficult to concentrate. Signs of autonomic nervous system discharge are evident: dilated pupils (mydriasis); mild sweating (diaphoresis); sleep interference. Heart rate becomes elevated (>115 bpm). Diastolic blood pressure (lower number) rises above 100 mmHg. Patients find relief in laying down and not moving.   Severe pain 5 Intense and extremely unpleasant. Associated with frowning face and frequent crying. Pain overwhelms the senses.  Ability to do any activity or maintain social relationships becomes significantly limited. Conversation becomes difficult. Pacing back and forth is common, as getting into a comfortable position is nearly impossible. Pain wakes you up from deep sleep. Physical signs will be obvious: pupillary dilation; increased sweating; goosebumps; brisk reflexes; cold, clammy hands and feet; nausea, vomiting or dry heaves; loss of appetite; significant sleep disturbance with inability to fall asleep or to remain asleep. When persistent, significant weight loss is observed due to the  complete loss of appetite and sleep deprivation.  Blood pressure and heart rate becomes significantly elevated. Caution:  If elevated blood pressure triggers a pounding headache associated with blurred vision, then the patient should immediately seek attention at an urgent or emergency care unit, as these may be signs of an impending stroke.    Emergency Department Pain Levels (6-10/10)  Emergency Room Pain 6 Severely limiting. Requires emergency care and should not be seen or managed at an outpatient pain management facility. Communication becomes difficult and requires great effort. Assistance to reach the emergency department may be required. Facial flushing and profuse sweating along with potentially dangerous increases in heart rate and blood pressure will be evident.   Distressing pain 7 Self-care is very difficult. Assistance is required to transport, or use restroom. Assistance to reach the emergency department will be required. Tasks requiring coordination, such as bathing and getting dressed become very difficult.   Disabling pain 8 Self-care is no longer possible. At this level, pain is disabling. The individual is unable to do even the most "basic" activities such as walking, eating, bathing, dressing, transferring to a bed, or toileting. Fine motor skills are lost. It is difficult to think clearly.   Incapacitating pain 9 Pain becomes incapacitating. Thought processing is no longer possible. Difficult to remember your own name. Control of movement and coordination are lost.   The worst pain imaginable 10 At this level, most patients pass out from pain. When this level is reached, collapse of the autonomic nervous system occurs, leading to a sudden drop in blood pressure and heart rate. This in turn results in a temporary and dramatic drop in blood flow to the brain, leading to a loss of consciousness. Fainting is one of the body's self defense mechanisms. Passing out puts the brain in a calmed state and causes it to shut down for a while, in order to begin the healing process.    Summary: 1. Refer to this  scale when providing [place] with your pain level. 2. Be accurate and careful when reporting your pain level. This will help with your care. 3. Over-reporting your pain level will lead to loss of credibility. 4. Even a level of 1/10 means that there is pain and will be treated at our facility. 5. High, inaccurate reporting will be documented as "Symptom Exaggeration", leading to loss of credibility and suspicions of possible secondary gains such as obtaining more narcotics, or wanting to appear disabled, for fraudulent reasons. 6. Only pain levels of 5 or below will be seen at our facility. 7. Pain levels of 6 and above will be sent to the Emergency Department and the appointment cancelled. ____________________________________________________________________________________________   ____________________________________________________________________________________________  Preparing for your procedure (without sedation)  Instructions: . Oral Intake: Do not eat or drink anything for at least 3 hours prior to your procedure. . Transportation: Unless otherwise stated by your physician, you may drive yourself after the procedure. . Blood Pressure Medicine: Take your blood pressure medicine with a sip of water the morning of the procedure. . Blood thinners: Notify our staff if you are taking any blood thinners. Depending on which one you take, there will be specific instructions on how and when to stop it. . Diabetics on insulin: Notify the staff so that you can be scheduled 1st case in the morning. If your diabetes requires high dose insulin, take only  of your normal insulin dose the morning of the procedure and notify the staff that you have done so. Marland Kitchen  Preventing infections: Shower with an antibacterial soap the morning of your procedure.  . Build-up your immune system: Take 1000 mg of Vitamin C with every meal (3 times a day) the day prior to your procedure. Marland Kitchen Antibiotics: Inform the staff if you  have a condition or reason that requires you to take antibiotics before dental procedures. . Pregnancy: If you are pregnant, call and cancel the procedure. . Sickness: If you have a cold, fever, or any active infections, call and cancel the procedure. . Arrival: You must be in the facility at least 30 minutes prior to your scheduled procedure. . Children: Do not bring any children with you. . Dress appropriately: Bring dark clothing that you would not mind if they get stained. . Valuables: Do not bring any jewelry or valuables.  Procedure appointments are reserved for interventional treatments only. Marland Kitchen No Prescription Refills. . No medication changes will be discussed during procedure appointments. . No disability issues will be discussed.  Reasons to call and reschedule or cancel your procedure: (Following these recommendations will minimize the risk of a serious complication.) . Surgeries: Avoid having procedures within 2 weeks of any surgery. (Avoid for 2 weeks before or after any surgery). . Flu Shots: Avoid having procedures within 2 weeks of a flu shots or . (Avoid for 2 weeks before or after immunizations). . Barium: Avoid having a procedure within 7-10 days after having had a radiological study involving the use of radiological contrast. (Myelograms, Barium swallow or enema study). . Heart attacks: Avoid any elective procedures or surgeries for the initial 6 months after a "Myocardial Infarction" (Heart Attack). . Blood thinners: It is imperative that you stop these medications before procedures. Let us know if you if you take any blood thinner.  . Infection: Avoid procedures during or within two weeks of an infection (including chest colds or gastrointestinal problems). Symptoms associated with infections include: Localized redness, fever, chills, night sweats or profuse sweating, burning sensation when voiding, cough, congestion, stuffiness, runny nose, sore throat, diarrhea, nausea,  vomiting, cold or Flu symptoms, recent or current infections. It is specially important if the infection is over the area that we intend to treat. Marland Kitchen Heart and lung problems: Symptoms that may suggest an active cardiopulmonary problem include: cough, chest pain, breathing difficulties or shortness of breath, dizziness, ankle swelling, uncontrolled high or unusually low blood pressure, and/or palpitations. If you are experiencing any of these symptoms, cancel your procedure and contact your primary care physician for an evaluation.  Remember:  Regular Business hours are:  Monday to Thursday 8:00 AM to 4:00 PM  Provider's Schedule: Delano Metz, MD:  Procedure days: Tuesday and Thursday 7:30 AM to 4:00 PM  Edward Jolly, MD:  Procedure days: Monday and Wednesday 7:30 AM to 4:00 PM ____________________________________________________________________________________________   ____________________________________________________________________________________________  Preparing for your procedure (without sedation)  Instructions: . Oral Intake: Do not eat or drink anything for at least 3 hours prior to your procedure. . Transportation: Unless otherwise stated by your physician, you may drive yourself after the procedure. . Blood Pressure Medicine: Take your blood pressure medicine with a sip of water the morning of the procedure. . Blood thinners: Notify our staff if you are taking any blood thinners. Depending on which one you take, there will be specific instructions on how and when to stop it. . Diabetics on insulin: Notify the staff so that you can be scheduled 1st case in the morning. If your diabetes requires high dose insulin, take only  of your normal insulin dose the morning of the procedure and notify the staff that you have done so. . Preventing infections: Shower with an antibacterial soap the morning of your procedure.  . Build-up your immune system: Take 1000 mg of Vitamin C  with every meal (3 times a day) the day prior to your procedure. Marland Kitchen Antibiotics: Inform the staff if you have a condition or reason that requires you to take antibiotics before dental procedures. . Pregnancy: If you are pregnant, call and cancel the procedure. . Sickness: If you have a cold, fever, or any active infections, call and cancel the procedure. . Arrival: You must be in the facility at least 30 minutes prior to your scheduled procedure. . Children: Do not bring any children with you. . Dress appropriately: Bring dark clothing that you would not mind if they get stained. . Valuables: Do not bring any jewelry or valuables.  Procedure appointments are reserved for interventional treatments only. Marland Kitchen No Prescription Refills. . No medication changes will be discussed during procedure appointments. . No disability issues will be discussed.  Reasons to call and reschedule or cancel your procedure: (Following these recommendations will minimize the risk of a serious complication.) . Surgeries: Avoid having procedures within 2 weeks of any surgery. (Avoid for 2 weeks before or after any surgery). . Flu Shots: Avoid having procedures within 2 weeks of a flu shots or . (Avoid for 2 weeks before or after immunizations). . Barium: Avoid having a procedure within 7-10 days after having had a radiological study involving the use of radiological contrast. (Myelograms, Barium swallow or enema study). . Heart attacks: Avoid any elective procedures or surgeries for the initial 6 months after a "Myocardial Infarction" (Heart Attack). . Blood thinners: It is imperative that you stop these medications before procedures. Let us know if you if you take any blood thinner.  . Infection: Avoid procedures during or within two weeks of an infection (including chest colds or gastrointestinal problems). Symptoms associated with infections include: Localized redness, fever, chills, night sweats or profuse sweating,  burning sensation when voiding, cough, congestion, stuffiness, runny nose, sore throat, diarrhea, nausea, vomiting, cold or Flu symptoms, recent or current infections. It is specially important if the infection is over the area that we intend to treat. Marland Kitchen Heart and lung problems: Symptoms that may suggest an active cardiopulmonary problem include: cough, chest pain, breathing difficulties or shortness of breath, dizziness, ankle swelling, uncontrolled high or unusually low blood pressure, and/or palpitations. If you are experiencing any of these symptoms, cancel your procedure and contact your primary care physician for an evaluation.  Remember:  Regular Business hours are:  Monday to Thursday 8:00 AM to 4:00 PM  Provider's Schedule: Delano Metz, MD:  Procedure days: Tuesday and Thursday 7:30 AM to 4:00 PM  Edward Jolly, MD:  Procedure days: Monday and Wednesday 7:30 AM to 4:00 PM ____________________________________________________________________________________________

## 2018-01-07 ENCOUNTER — Telehealth: Payer: Self-pay

## 2018-01-07 NOTE — Telephone Encounter (Signed)
The patient called and said she does not want to go to [facility] she wants to go to [place] [facility] [place] instead.Marland Kitchen

## 2018-01-15 ENCOUNTER — Ambulatory Visit: Payer: Medicare Other | Admitting: Pain Medicine

## 2018-01-15 NOTE — Progress Notes (Deleted)
Patient's Name: [name]  MRN: [id number]  Referring Provider: Gareth Morgan, MD  DOB: [birth year]  PCP: Gareth Morgan, MD  DOS: 01/15/2018  Note by: Oswaldo Done, MD  Service setting: Ambulatory outpatient  Specialty: Interventional Pain Management  Patient type: Established  Location: [facility] (AMB) Pain Management Facility  Visit type: Interventional Procedure   Primary Reason for Visit: Interventional Pain Management Treatment. CC: No chief complaint on file.  Procedure:          Anesthesia, Analgesia, Anxiolysis:  Type: Therapeutic Intra-Articular Hyalgan Knee Injection #1  Region: Lateral infrapatellar Knee Region Level: Knee Joint Laterality: Bilateral  Type: Local Anesthesia Indication(s): Analgesia         Local Anesthetic: Lidocaine 1-2% Route: Infiltration (Apollo/IM) IV Access: Declined Sedation: Declined   Position: Sitting   Indications: 1. Osteoarthritis of knee (Bilateral)   2. Chronic knee pain [facility] Area of Pain) (Bilateral) (L>R)    Pain Score: Pre-procedure:  /10 Post-procedure:  /10  Pre-op Assessment:  [name] is a 77 y.o. (year old), female patient, seen today for interventional treatment. She  has a past surgical history that includes Vesicovaginal fistula closure w/ TAH; Carpal tunnel release - bilateral (1992); Total hip replacement - right (2002); Stomach surgery gastropexy for gastric volvulus (2009); Tubular Adenoma; Shoulder surgery; Spinal cord stimulator insertion; Esophagogastroduodenoscopy (08/12/2007); Colonoscopy (N/A, 03/04/2013); Abdominal hysterectomy; Colonoscopy (06/14/2006); Cerebral aneurysm repair (2004); Eye surgery; Cataract extraction w/ intraocular lens implant (Bilateral); Cardiac catheterization; Tubal ligation; and Total shoulder arthroplasty (Left, 01/30/2017). [name] has a current medication list which includes the following prescription(s): vitamin d3, dicyclomine, doxepin, fluticasone-salmeterol, gabapentin,  glipizide, guaifenesin, insulin pen needle, levothyroxine, omeprazole, proair hfa, and semaglutide(0.25 or 0.5mg /dos), and the following Facility-Administered Medications: vancomycin. Her primarily concern today is the No chief complaint on file.  Initial Vital Signs:  Pulse/HCG Rate:    Temp:   Resp:   BP:   SpO2:    BMI: Estimated body mass index is 43.16 kg/m as calculated from the following:   Height as of 01/02/18: 5' (1.524 m).   Weight as of 01/02/18: 221 lb (100.2 kg).  Risk Assessment: Allergies: Reviewed. She is allergic to oxycodone; codeine; hydrocodone; and naproxen.  Allergy Precautions: None required Coagulopathies: Reviewed. None identified.  Blood-thinner therapy: None at this time Active Infection(s): Reviewed. None identified. [name] is afebrile  Site Confirmation: [name] was asked to confirm the procedure and laterality before marking the site Procedure checklist: Completed Consent: Before the procedure and under the influence of no sedative(s), amnesic(s), or anxiolytics, the patient was informed of the treatment options, risks and possible complications. To fulfill our ethical and legal obligations, as recommended by the American Medical Association's Code of Ethics, I have informed the patient of my clinical impression; the nature and purpose of the treatment or procedure; the risks, benefits, and possible complications of the intervention; the alternatives, including doing nothing; the risk(s) and benefit(s) of the alternative treatment(s) or procedure(s); and the risk(s) and benefit(s) of doing nothing. The patient was provided information about the general risks and possible complications associated with the procedure. These may include, but are not limited to: failure to achieve desired goals, infection, bleeding, organ or nerve damage, allergic reactions, paralysis, and death. In addition, the patient was informed of those risks and complications  associated to the procedure, such as failure to decrease pain; infection; bleeding; organ or nerve damage with subsequent damage to sensory, motor, and/or autonomic systems, resulting in permanent pain, numbness, and/or weakness  of one or several areas of the body; allergic reactions; (i.e.: anaphylactic reaction); and/or death. Furthermore, the patient was informed of those risks and complications associated with the medications. These include, but are not limited to: allergic reactions (i.e.: anaphylactic or anaphylactoid reaction(s)); adrenal axis suppression; blood sugar elevation that in diabetics may result in ketoacidosis or comma; water retention that in patients with history of congestive heart failure may result in shortness of breath, pulmonary edema, and decompensation with resultant heart failure; weight gain; swelling or edema; medication-induced neural toxicity; particulate matter embolism and blood vessel occlusion with resultant organ, and/or nervous system infarction; and/or aseptic necrosis of one or more joints. Finally, the patient was informed that Medicine is not an exact science; therefore, there is also the possibility of unforeseen or unpredictable risks and/or possible complications that may result in a catastrophic outcome. The patient indicated having understood very clearly. We have given the patient no guarantees and we have made no promises. Enough time was given to the patient to ask questions, all of which were answered to the patient's satisfaction. [name] has indicated that she wanted to continue with the procedure. Attestation: I, the ordering provider, attest that I have discussed with the patient the benefits, risks, side-effects, alternatives, likelihood of achieving goals, and potential problems during recovery for the procedure that I have provided informed consent. Date  Time: {CHL [facility]-PAIN TIME CHOICES:21018001}  Pre-Procedure Preparation:  Monitoring: As  per clinic protocol. Respiration, ETCO2, SpO2, BP, heart rate and rhythm monitor placed and checked for adequate function Safety Precautions: Patient was assessed for positional comfort and pressure points before starting the procedure. Time-out: I initiated and conducted the "Time-out" before starting the procedure, as per protocol. The patient was asked to participate by confirming the accuracy of the "Time Out" information. Verification of the correct person, site, and procedure were performed and confirmed by me, the nursing staff, and the patient. "Time-out" conducted as per Joint Commission's Universal Protocol (UP.01.01.01). Time:    Description of Procedure:          Target Area: Knee Joint Approach: Just above the Lateral tibial plateau, lateral to the infrapatellar tendon. Area Prepped: Entire knee area, from the mid-thigh to the mid-shin. Prepping solution: ChloraPrep (2% chlorhexidine gluconate and 70% isopropyl alcohol) Safety Precautions: Aspiration looking for blood return was conducted prior to all injections. At no point did we inject any substances, as a needle was being advanced. No attempts were made at seeking any paresthesias. Safe injection practices and needle disposal techniques used. Medications properly checked for expiration dates. SDV (single dose vial) medications used. Description of the Procedure: Protocol guidelines were followed. The patient was placed in position over the fluoroscopy table. The target area was identified and the area prepped in the usual manner. Skin & deeper tissues infiltrated with local anesthetic. Appropriate amount of time allowed to pass for local anesthetics to take effect. The procedure needles were then advanced to the target area. Proper needle placement secured. Negative aspiration confirmed. Solution injected in intermittent fashion, asking for systemic symptoms every 0.5cc of injectate. The needles were then removed and the area cleansed,  making sure to leave some of the prepping solution back to take advantage of its long term bactericidal properties. There were no vitals filed for this visit.  Start Time:   hrs. End Time:   hrs. Materials:  Needle(s) Type: Regular needle Gauge: 25G Length: 1.5-in Medication(s): Please see orders for medications and dosing details.  Imaging Guidance:  Type of Imaging Technique: None used Indication(s): N/A Exposure Time: No patient exposure Contrast: None used. Fluoroscopic Guidance: N/A Ultrasound Guidance: N/A Interpretation: N/A  Antibiotic Prophylaxis:   Anti-infectives (From admission, onward)   None     Indication(s): None identified  Post-operative Assessment:  Post-procedure Vital Signs:  Pulse/HCG Rate:    Temp:   Resp:   BP:   SpO2:    EBL: None  Complications: No immediate post-treatment complications observed by team, or reported by patient.  Note: The patient tolerated the entire procedure well. A repeat set of vitals were taken after the procedure and the patient was kept under observation following institutional policy, for this type of procedure. Post-procedural neurological assessment was performed, showing return to baseline, prior to discharge. The patient was provided with post-procedure discharge instructions, including a section on how to identify potential problems. Should any problems arise concerning this procedure, the patient was given instructions to immediately contact us, at any time, without hesitation. In any case, we plan to contact the patient by telephone for a follow-up status report regarding this interventional procedure.  Comments:  No additional relevant information.  Plan of Care   Imaging Orders  No imaging studies ordered today   Procedure Orders    No procedure(s) ordered today    Medications ordered for procedure: No orders of the defined types were placed in this encounter.  Medications administered: Teofila H.  Steptoe had no medications administered during this visit.  See the medical record for exact dosing, route, and time of administration.  Disposition: Discharge home  Discharge Date & Time: 01/15/2018;   hrs.   Physician-requested Follow-up: No follow-ups on file.  Future Appointments  Date Time Provider Department Center  01/15/2018 12:30 PM Delano Metz, MD [facility] None  04/18/2018  1:30 PM Nida, Denman George, MD [facility] None   Primary Care Physician: Gareth Morgan, MD Location: [facility] Facility Note by: Oswaldo Done, MD Date: 01/15/2018; Time: 6:37 AM  Disclaimer:  Medicine is not an exact science. The only guarantee in medicine is that nothing is guaranteed. It is important to note that the decision to proceed with this intervention was based on the information collected from the patient. The Data and conclusions were drawn from the patient's questionnaire, the interview, and the physical examination. Because the information was provided in large part by the patient, it cannot be guaranteed that it has not been purposely or unconsciously manipulated. Every effort has been made to obtain as much relevant data as possible for this evaluation. It is important to note that the conclusions that lead to this procedure are derived in large part from the available data. Always take into account that the treatment will also be dependent on availability of resources and existing treatment guidelines, considered by other Pain Management Practitioners as being common knowledge and practice, at the time of the intervention. For Medico-Legal purposes, it is also important to point out that variation in procedural techniques and pharmacological choices are the acceptable norm. The indications, contraindications, technique, and results of the above procedure should only be interpreted and judged by a Board-Certified Interventional Pain Specialist with extensive  familiarity and expertise in the same exact procedure and technique.

## 2018-01-16 ENCOUNTER — Encounter: Payer: Self-pay | Admitting: Gastroenterology

## 2018-01-23 DIAGNOSIS — J189 Pneumonia, unspecified organism: Secondary | ICD-10-CM | POA: Insufficient documentation

## 2018-01-24 DIAGNOSIS — Z8709 Personal history of other diseases of the respiratory system: Secondary | ICD-10-CM | POA: Insufficient documentation

## 2018-01-25 DIAGNOSIS — B348 Other viral infections of unspecified site: Secondary | ICD-10-CM | POA: Insufficient documentation

## 2018-01-29 ENCOUNTER — Ambulatory Visit: Payer: Medicare Other | Admitting: Dietician

## 2018-03-21 ENCOUNTER — Ambulatory Visit: Payer: Medicare Other | Attending: Pain Medicine | Admitting: Pain Medicine

## 2018-03-21 ENCOUNTER — Encounter: Payer: Self-pay | Admitting: Pain Medicine

## 2018-03-21 ENCOUNTER — Ambulatory Visit: Payer: Medicare Other | Admitting: Pain Medicine

## 2018-03-21 ENCOUNTER — Other Ambulatory Visit: Payer: Self-pay

## 2018-03-21 VITALS — BP 122/88 | HR 80 | Temp 97.9°F | Resp 18 | Ht 60.0 in | Wt 225.0 lb

## 2018-03-21 DIAGNOSIS — M170 Bilateral primary osteoarthritis of knee: Secondary | ICD-10-CM | POA: Insufficient documentation

## 2018-03-21 DIAGNOSIS — G8929 Other chronic pain: Secondary | ICD-10-CM | POA: Diagnosis not present

## 2018-03-21 DIAGNOSIS — Z886 Allergy status to analgesic agent status: Secondary | ICD-10-CM | POA: Diagnosis not present

## 2018-03-21 DIAGNOSIS — Z96641 Presence of right artificial hip joint: Secondary | ICD-10-CM | POA: Insufficient documentation

## 2018-03-21 DIAGNOSIS — Z7984 Long term (current) use of oral hypoglycemic drugs: Secondary | ICD-10-CM | POA: Diagnosis not present

## 2018-03-21 DIAGNOSIS — Z79890 Hormone replacement therapy (postmenopausal): Secondary | ICD-10-CM | POA: Insufficient documentation

## 2018-03-21 DIAGNOSIS — Z885 Allergy status to narcotic agent status: Secondary | ICD-10-CM | POA: Diagnosis not present

## 2018-03-21 DIAGNOSIS — Z79899 Other long term (current) drug therapy: Secondary | ICD-10-CM | POA: Diagnosis not present

## 2018-03-21 DIAGNOSIS — M25562 Pain in left knee: Secondary | ICD-10-CM

## 2018-03-21 DIAGNOSIS — M25561 Pain in right knee: Secondary | ICD-10-CM

## 2018-03-21 MED ORDER — SODIUM HYALURONATE (VISCOSUP) 20 MG/2ML IX SOSY
2.0000 mL | PREFILLED_SYRINGE | Freq: Once | INTRA_ARTICULAR | Status: AC
  Administered 2018-03-21: 2 mL via INTRA_ARTICULAR

## 2018-03-21 MED ORDER — ROPIVACAINE HCL 2 MG/ML IJ SOLN
4.0000 mL | Freq: Once | INTRAMUSCULAR | Status: AC
  Administered 2018-03-21: 4 mL via INTRA_ARTICULAR
  Filled 2018-03-21: qty 10

## 2018-03-21 MED ORDER — LIDOCAINE HCL (PF) 1 % IJ SOLN
4.0000 mL | Freq: Once | INTRAMUSCULAR | Status: AC
  Administered 2018-03-21: 4 mL
  Filled 2018-03-21: qty 5

## 2018-03-21 NOTE — Patient Instructions (Addendum)
____________________________________________________________________________________________  Post-Procedure Discharge Instructions  Instructions:  Apply ice: Fill a plastic sandwich bag with crushed ice. Cover it with a small towel and apply to injection site. Apply for 15 minutes then remove x 15 minutes. Repeat sequence on day of procedure, until you go to bed. The purpose is to minimize swelling and discomfort after procedure.  Apply heat: Apply heat to procedure site starting the day following the procedure. The purpose is to treat any soreness and discomfort from the procedure.  Food intake: Start with clear liquids (like water) and advance to regular food, as tolerated.   Physical activities: Keep activities to a minimum for the first 8 hours after the procedure.   Driving: If you have received any sedation, you are not allowed to drive for 24 hours after your procedure.  Blood thinner: Restart your blood thinner 6 hours after your procedure. (Only for those taking blood thinners)  Insulin: As soon as you can eat, you may resume your normal dosing schedule. (Only for those taking insulin)  Infection prevention: Keep procedure site clean and dry.  Post-procedure Pain Diary: Extremely important that this be done correctly and accurately. Recorded information will be used to determine the next step in treatment.  Pain evaluated is that of treated area only. Do not include pain from an untreated area.  Complete every hour, on the hour, for the initial 8 hours. Set an alarm to help you do this part accurately.  Do not go to sleep and have it completed later. It will not be accurate.  Follow-up appointment: Keep your follow-up appointment after the procedure. Usually 2 weeks for most procedures. (6 weeks in the case of radiofrequency.) Bring you pain diary.   Expect:  From numbing medicine (AKA: Local Anesthetics): Numbness or decrease in pain.  Onset: Full effect within 15  minutes of injected.  Duration: It will depend on the type of local anesthetic used. On the average, 1 to 8 hours.   From steroids: Decrease in swelling or inflammation. Once inflammation is improved, relief of the pain will follow.  Onset of benefits: Depends on the amount of swelling present. The more swelling, the longer it will take for the benefits to be seen. In some cases, up to 10 days.  Duration: Steroids will stay in the system x 2 weeks. Duration of benefits will depend on multiple posibilities including persistent irritating factors.  Occasional side-effects: Facial flushing (red, warm cheeks) , cramps (if present, drink Gatorade and take over-the-counter Magnesium 450-500 mg once to twice a day).  From procedure: Some discomfort is to be expected once the numbing medicine wears off. This should be minimal if ice and heat are applied as instructed.  Call if:  You experience numbness and weakness that gets worse with time, as opposed to wearing off.  New onset bowel or bladder incontinence. (This applies to Spinal procedures only)  Emergency Numbers:  Durning business hours (Monday - Thursday, 8:00 AM - 4:00 PM) (Friday, 9:00 AM - 12:00 Noon): [phone]  After hours: [phone] ____________________________________________________________________________________________   ____________________________________________________________________________________________  Preparing for your procedure (without sedation)  Instructions: . Oral Intake: Do not eat or drink anything for at least 3 hours prior to your procedure. . Transportation: Unless otherwise stated by your physician, you may drive yourself after the procedure. . Blood Pressure Medicine: Take your blood pressure medicine with a sip of water the morning of the procedure. . Blood thinners: Notify our staff if you are taking any blood   thinners. Depending on which one you take, there will be specific  instructions on how and when to stop it. . Diabetics on insulin: Notify the staff so that you can be scheduled 1st case in the morning. If your diabetes requires high dose insulin, take only  of your normal insulin dose the morning of the procedure and notify the staff that you have done so. . Preventing infections: Shower with an antibacterial soap the morning of your procedure.  . Build-up your immune system: Take 1000 mg of Vitamin C with every meal (3 times a day) the day prior to your procedure. Marland Kitchen. Antibiotics: Inform the staff if you have a condition or reason that requires you to take antibiotics before dental procedures. . Pregnancy: If you are pregnant, call and cancel the procedure. . Sickness: If you have a cold, fever, or any active infections, call and cancel the procedure. . Arrival: You must be in the facility at least 30 minutes prior to your scheduled procedure. . Children: Do not bring any children with you. . Dress appropriately: Bring dark clothing that you would not mind if they get stained. . Valuables: Do not bring any jewelry or valuables.  Procedure appointments are reserved for interventional treatments only. Marland Kitchen. No Prescription Refills. . No medication changes will be discussed during procedure appointments. . No disability issues will be discussed.  Reasons to call and reschedule or cancel your procedure: (Following these recommendations will minimize the risk of a serious complication.) . Surgeries: Avoid having procedures within 2 weeks of any surgery. (Avoid for 2 weeks before or after any surgery). . Flu Shots: Avoid having procedures within 2 weeks of a flu shots or . (Avoid for 2 weeks before or after immunizations). . Barium: Avoid having a procedure within 7-10 days after having had a radiological study involving the use of radiological contrast. (Myelograms, Barium swallow or enema study). . Heart attacks: Avoid any elective procedures or surgeries for the  initial 6 months after a "Myocardial Infarction" (Heart Attack). . Blood thinners: It is imperative that you stop these medications before procedures. Let us know if you if you take any blood thinner.  . Infection: Avoid procedures during or within two weeks of an infection (including chest colds or gastrointestinal problems). Symptoms associated with infections include: Localized redness, fever, chills, night sweats or profuse sweating, burning sensation when voiding, cough, congestion, stuffiness, runny nose, sore throat, diarrhea, nausea, vomiting, cold or Flu symptoms, recent or current infections. It is specially important if the infection is over the area that we intend to treat. Marland Kitchen. Heart and lung problems: Symptoms that may suggest an active cardiopulmonary problem include: cough, chest pain, breathing difficulties or shortness of breath, dizziness, ankle swelling, uncontrolled high or unusually low blood pressure, and/or palpitations. If you are experiencing any of these symptoms, cancel your procedure and contact your primary care physician for an evaluation.  Remember:  Regular Business hours are:  Monday to Thursday 8:00 AM to 4:00 PM  Provider's Schedule: Delano MetzFrancisco Canyon Lohr, MD:  Procedure days: Tuesday and Thursday 7:30 AM to 4:00 PM  Edward JollyBilal Lateef, MD:  Procedure days: Monday and Wednesday 7:30 AM to 4:00 PM ____________________________________________________________________________________________   Pain Management Discharge Instructions  General Discharge Instructions :  If you need to reach your doctor call: Monday-Friday 8:00 am - 4:00 pm at [phone] or toll free [phone].  After clinic hours [phone] to have operator reach doctor.  Bring all of your medication bottles to all your appointments in the  pain clinic.  To cancel or reschedule your appointment with Pain Management please remember to call 24 hours in advance to avoid a fee.  Refer to the educational  materials which you have been given on: General Risks, I had my Procedure. Discharge Instructions, Post Sedation.  Post Procedure Instructions:  The drugs you were given will stay in your system until tomorrow, so for the next 24 hours you should not drive, make any legal decisions or drink any alcoholic beverages.  You may eat anything you prefer, but it is better to start with liquids then soups and crackers, and gradually work up to solid foods.  Please notify your doctor immediately if you have any unusual bleeding, trouble breathing or pain that is not related to your normal pain.  Depending on the type of procedure that was done, some parts of your body may feel week and/or numb.  This usually clears up by tonight or the next day.  Walk with the use of an assistive device or accompanied by an adult for the 24 hours.  You may use ice on the affected area for the first 24 hours.  Put ice in a Ziploc bag and cover with a towel and place against area 15 minutes on 15 minutes off.  You may switch to heat after 24 hours.

## 2018-03-21 NOTE — Progress Notes (Signed)
Patient's Name: [name] H Henry  MRN: [id number]  Referring Provider: Gareth MorganKnowlton, Steve, MD  DOB: 06/19/1940  PCP: Gareth MorganKnowlton, Steve, MD  DOS: 03/21/2018  Note by: Oswaldo DoneFrancisco A Buel Molder, MD  Service setting: Ambulatory outpatient  Specialty: Interventional Pain Management  Patient type: Established  Location: [facility] (AMB) Pain Management Facility  Visit type: Interventional Procedure   Primary Reason for Visit: Interventional Pain Management Treatment. CC: Knee Pain (bilateral)  Procedure:          Anesthesia, Analgesia, Anxiolysis:  Type: Therapeutic Intra-Articular Hyalgan Knee Injection #1  Region: Lateral infrapatellar Knee Region Level: Knee Joint Laterality: Bilateral  Type: Local Anesthesia Indication(s): Analgesia         Local Anesthetic: Lidocaine 1-2% Route: Infiltration (Hand/IM) IV Access: Declined Sedation: Declined   Position: Sitting   Indications: 1. Osteoarthritis of knee (Bilateral)   2. Chronic knee pain (Tertiary Area of Pain) (Bilateral) (L>R)    Pain Score: Pre-procedure: 7 /10 Post-procedure: 2 /10  Pre-op Assessment:  [name] is a 77 y.o. (year old), female patient, seen today for interventional treatment. She  has a past surgical history that includes Vesicovaginal fistula closure w/ TAH; Carpal tunnel release - bilateral (1992); Total hip replacement - right (2002); Stomach surgery gastropexy for gastric volvulus (2009); Tubular Adenoma; Shoulder surgery; Spinal cord stimulator insertion; Esophagogastroduodenoscopy (08/12/2007); Colonoscopy (N/A, 03/04/2013); Abdominal hysterectomy; Colonoscopy (06/14/2006); Cerebral aneurysm repair (2004); Eye surgery; Cataract extraction w/ intraocular lens implant (Bilateral); Cardiac catheterization; Tubal ligation; and Total shoulder arthroplasty (Left, 01/30/2017). [name] has a current medication list which includes the following prescription(s): vitamin d3, dicyclomine, doxepin, fluticasone-salmeterol, gabapentin,  glipizide, guaifenesin, insulin pen needle, levothyroxine, omeprazole, proair hfa, and semaglutide(0.25 or 0.5mg /dos), and the following Facility-Administered Medications: vancomycin. Her primarily concern today is the Knee Pain (bilateral)  Initial Vital Signs:  Pulse/HCG Rate: 82  Temp: 97.9 F (36.6 C) Resp: 18 BP: 123/90 SpO2: 100 %  BMI: Estimated body mass index is 43.94 kg/m as calculated from the following:   Height as of this encounter: 5' (1.524 m).   Weight as of this encounter: 225 lb (102.1 kg).  Risk Assessment: Allergies: Reviewed. She is allergic to oxycodone; codeine; hydrocodone; and naproxen.  Allergy Precautions: None required Coagulopathies: Reviewed. None identified.  Blood-thinner therapy: None at this time Active Infection(s): Reviewed. None identified. [name] is afebrile  Site Confirmation: [name] was asked to confirm the procedure and laterality before marking the site Procedure checklist: Completed Consent: Before the procedure and under the influence of no sedative(s), amnesic(s), or anxiolytics, the patient was informed of the treatment options, risks and possible complications. To fulfill our ethical and legal obligations, as recommended by the American Medical Association's Code of Ethics, I have informed the patient of my clinical impression; the nature and purpose of the treatment or procedure; the risks, benefits, and possible complications of the intervention; the alternatives, including doing nothing; the risk(s) and benefit(s) of the alternative treatment(s) or procedure(s); and the risk(s) and benefit(s) of doing nothing. The patient was provided information about the general risks and possible complications associated with the procedure. These may include, but are not limited to: failure to achieve desired goals, infection, bleeding, organ or nerve damage, allergic reactions, paralysis, and death. In addition, the patient was informed of  those risks and complications associated to the procedure, such as failure to decrease pain; infection; bleeding; organ or nerve damage with subsequent damage to sensory, motor, and/or autonomic systems, resulting in permanent pain, numbness, and/or weakness of one or  several areas of the body; allergic reactions; (i.e.: anaphylactic reaction); and/or death. Furthermore, the patient was informed of those risks and complications associated with the medications. These include, but are not limited to: allergic reactions (i.e.: anaphylactic or anaphylactoid reaction(s)); adrenal axis suppression; blood sugar elevation that in diabetics may result in ketoacidosis or comma; water retention that in patients with history of congestive heart failure may result in shortness of breath, pulmonary edema, and decompensation with resultant heart failure; weight gain; swelling or edema; medication-induced neural toxicity; particulate matter embolism and blood vessel occlusion with resultant organ, and/or nervous system infarction; and/or aseptic necrosis of one or more joints. Finally, the patient was informed that Medicine is not an exact science; therefore, there is also the possibility of unforeseen or unpredictable risks and/or possible complications that may result in a catastrophic outcome. The patient indicated having understood very clearly. We have given the patient no guarantees and we have made no promises. Enough time was given to the patient to ask questions, all of which were answered to the patient's satisfaction. Ms. Placido has indicated that she wanted to continue with the procedure. Attestation: I, the ordering provider, attest that I have discussed with the patient the benefits, risks, side-effects, alternatives, likelihood of achieving goals, and potential problems during recovery for the procedure that I have provided informed consent. Date  Time:   Pre-Procedure Preparation:  Monitoring: As per  clinic protocol. Respiration, ETCO2, SpO2, BP, heart rate and rhythm monitor placed and checked for adequate function Safety Precautions: Patient was assessed for positional comfort and pressure points before starting the procedure. Time-out: I initiated and conducted the "Time-out" before starting the procedure, as per protocol. The patient was asked to participate by confirming the accuracy of the "Time Out" information. Verification of the correct person, site, and procedure were performed and confirmed by me, the nursing staff, and the patient. "Time-out" conducted as per Joint Commission's Universal Protocol (UP.01.01.01). Time: 1303  Description of Procedure:          Target Area: Knee Joint Approach: Just above the Lateral tibial plateau, lateral to the infrapatellar tendon. Area Prepped: Entire knee area, from the mid-thigh to the mid-shin. Prepping solution: ChloraPrep (2% chlorhexidine gluconate and 70% isopropyl alcohol) Safety Precautions: Aspiration looking for blood return was conducted prior to all injections. At no point did we inject any substances, as a needle was being advanced. No attempts were made at seeking any paresthesias. Safe injection practices and needle disposal techniques used. Medications properly checked for expiration dates. SDV (single dose vial) medications used. Description of the Procedure: Protocol guidelines were followed. The patient was placed in position over the fluoroscopy table. The target area was identified and the area prepped in the usual manner. Skin & deeper tissues infiltrated with local anesthetic. Appropriate amount of time allowed to pass for local anesthetics to take effect. The procedure needles were then advanced to the target area. Proper needle placement secured. Negative aspiration confirmed. Solution injected in intermittent fashion, asking for systemic symptoms every 0.5cc of injectate. The needles were then removed and the area cleansed,  making sure to leave some of the prepping solution back to take advantage of its long term bactericidal properties. Vitals:   03/21/18 1236 03/21/18 1310  BP: 123/90 122/88  Pulse: 82 80  Resp: 18 18  Temp: 97.9 F (36.6 C)   TempSrc: Oral   SpO2: 100% 99%  Weight: 225 lb (102.1 kg)   Height: 5' (1.524 m)  Start Time: 1303 hrs. End Time: 1306 hrs. Materials:  Needle(s) Type: Regular needle Gauge: 25G Length: 1.5-in Medication(s): Please see orders for medications and dosing details.  Imaging Guidance:          Type of Imaging Technique: None used Indication(s): N/A Exposure Time: No patient exposure Contrast: None used. Fluoroscopic Guidance: N/A Ultrasound Guidance: N/A Interpretation: N/A  Antibiotic Prophylaxis:   Anti-infectives (From admission, onward)   None     Indication(s): None identified  Post-operative Assessment:  Post-procedure Vital Signs:  Pulse/HCG Rate: 80  Temp: 97.9 F (36.6 C) Resp: 18 BP: 122/88 SpO2: 99 %  EBL: None  Complications: No immediate post-treatment complications observed by team, or reported by patient.  Note: The patient tolerated the entire procedure well. A repeat set of vitals were taken after the procedure and the patient was kept under observation following institutional policy, for this type of procedure. Post-procedural neurological assessment was performed, showing return to baseline, prior to discharge. The patient was provided with post-procedure discharge instructions, including a section on how to identify potential problems. Should any problems arise concerning this procedure, the patient was given instructions to immediately contact us, at any time, without hesitation. In any case, we plan to contact the patient by telephone for a follow-up status report regarding this interventional procedure.  Comments:  No additional relevant information.  Plan of Care   Imaging Orders  No imaging studies ordered today     Procedure Orders     KNEE INJECTION     KNEE INJECTION  Medications ordered for procedure: Meds ordered this encounter  Medications  . lidocaine (PF) (XYLOCAINE) 1 % injection 4 mL  . ropivacaine (PF) 2 mg/mL (0.2%) (NAROPIN) injection 4 mL  . Sodium Hyaluronate SOSY 2 mL  . Sodium Hyaluronate SOSY 2 mL   Medications administered: We administered lidocaine (PF), ropivacaine (PF) 2 mg/mL (0.2%), Sodium Hyaluronate, and Sodium Hyaluronate.  See the medical record for exact dosing, route, and time of administration.  Disposition: Discharge home  Discharge Date & Time: 03/21/2018; 1315 hrs.   Physician-requested Follow-up: Return for Procedure (no sedation): (B) Hyalgan #2.  Future Appointments  Date Time Provider Department Center  04/15/2018 11:45 AM Delano MetzNaveira, Ephrata Verville, MD [facility] None  04/18/2018  1:30 PM Nida, Denman GeorgeGebreselassie W, MD [facility] None   Primary Care Physician: Gareth MorganKnowlton, Steve, MD [facility] Note by: Oswaldo DoneFrancisco A Mikey Maffett, MD Date: 03/21/2018; Time: 1:26 PM  Disclaimer:  Medicine is not an Visual merchandiserexact science. The only guarantee in medicine is that nothing is guaranteed. It is important to note that the decision to proceed with this intervention was based on the information collected from the patient. The Data and conclusions were drawn from the patient's questionnaire, the interview, and the physical examination. Because the information was provided in large part by the patient, it cannot be guaranteed that it has not been purposely or unconsciously manipulated. Every effort has been made to obtain as much relevant data as possible for this evaluation. It is important to note that the conclusions that lead to this procedure are derived in large part from the available data. Always take into account that the treatment will also be dependent on availability of resources and existing treatment guidelines, considered by other Pain Management  Practitioners as being common knowledge and practice, at the time of the intervention. For Medico-Legal purposes, it is also important to point out that variation in procedural techniques and pharmacological choices are the acceptable norm. The  indications, contraindications, technique, and results of the above procedure should only be interpreted and judged by a Board-Certified Interventional Pain Specialist with extensive familiarity and expertise in the same exact procedure and technique.

## 2018-03-22 ENCOUNTER — Telehealth: Payer: Self-pay

## 2018-03-22 NOTE — Telephone Encounter (Signed)
Post procedure phone call.  Patient states she is doing well.  

## 2018-04-09 ENCOUNTER — Ambulatory Visit: Payer: Medicare Other | Admitting: Pain Medicine

## 2018-04-14 NOTE — Progress Notes (Addendum)
Patient's Name: [name] H Roseland  MRN: [id number]  Referring Provider: Gareth MorganKnowlton, Steve, MD  DOB: 06/23/1940  PCP: Gareth MorganKnowlton, Steve, MD  DOS: 04/15/2018  Note by: Oswaldo DoneFrancisco A Kaydi Kley, MD  Service setting: Ambulatory outpatient  Specialty: Interventional Pain Management  Patient type: Established  Location: [facility] (AMB) Pain Management Facility  Visit type: Interventional Procedure   Primary Reason for Visit: Interventional Pain Management Treatment. CC: Follow-up (following hyalgan knee injection ) and Knee Pain  Procedure:          Anesthesia, Analgesia, Anxiolysis:  Type: Therapeutic Intra-Articular Hyalgan Knee Injection #2  Region: Lateral infrapatellar Knee Region Level: Knee Joint Laterality: Bilateral  Type: Local Anesthesia Indication(s): Analgesia         Local Anesthetic: Lidocaine 1-2% Route: Infiltration (Follett/IM) IV Access: Declined Sedation: Declined   Position: Sitting   Indications: 1. Chronic knee pain (Tertiary Area of Pain) (Bilateral) (L>R)   2. Osteoarthritis of knee (Bilateral)   3. Chronic pain syndrome    Pain Score: Pre-procedure: 0-No pain/10 Post-procedure: 0-No pain/10  Pre-op Assessment:  [name] is a 78 y.o. (year old), female patient, seen today for interventional treatment. She  has a past surgical history that includes Vesicovaginal fistula closure w/ TAH; Carpal tunnel release - bilateral (1992); Total hip replacement - right (2002); Stomach surgery gastropexy for gastric volvulus (2009); Tubular Adenoma; Shoulder surgery; Spinal cord stimulator insertion; Esophagogastroduodenoscopy (08/12/2007); Colonoscopy (N/A, 03/04/2013); Abdominal hysterectomy; Colonoscopy (06/14/2006); Cerebral aneurysm repair (2004); Eye surgery; Cataract extraction w/ intraocular lens implant (Bilateral); Cardiac catheterization; Tubal ligation; and Total shoulder arthroplasty (Left, 01/30/2017). [name] has a current medication list which includes the following  prescription(s): vitamin d3, dicyclomine, doxepin, fluticasone-salmeterol, gabapentin, glipizide, guaifenesin, insulin pen needle, levothyroxine, omeprazole, proair hfa, and semaglutide(0.25 or 0.5mg /dos), and the following Facility-Administered Medications: vancomycin. Her primarily concern today is the Follow-up (following hyalgan knee injection ) and Knee Pain  Initial Vital Signs:  Pulse/HCG Rate: 77  Temp: 98.5 F (36.9 C) Resp: 18 BP: 136/81 SpO2: 100 %  BMI: Estimated body mass index is 41.01 kg/m as calculated from the following:   Height as of this encounter: 5' (1.524 m).   Weight as of this encounter: 210 lb (95.3 kg).  Risk Assessment: Allergies: Reviewed. She is allergic to oxycodone; codeine; hydrocodone; and naproxen.  Allergy Precautions: None required Coagulopathies: Reviewed. None identified.  Blood-thinner therapy: None at this time Active Infection(s): Reviewed. None identified. [name] is afebrile  Site Confirmation: [name] was asked to confirm the procedure and laterality before marking the site Procedure checklist: Completed Consent: Before the procedure and under the influence of no sedative(s), amnesic(s), or anxiolytics, the patient was informed of the treatment options, risks and possible complications. To fulfill our ethical and legal obligations, as recommended by the American Medical Association's Code of Ethics, I have informed the patient of my clinical impression; the nature and purpose of the treatment or procedure; the risks, benefits, and possible complications of the intervention; the alternatives, including doing nothing; the risk(s) and benefit(s) of the alternative treatment(s) or procedure(s); and the risk(s) and benefit(s) of doing nothing. The patient was provided information about the general risks and possible complications associated with the procedure. These may include, but are not limited to: failure to achieve desired goals,  infection, bleeding, organ or nerve damage, allergic reactions, paralysis, and death. In addition, the patient was informed of those risks and complications associated to the procedure, such as failure to decrease pain; infection; bleeding; organ or nerve damage with  subsequent damage to sensory, motor, and/or autonomic systems, resulting in permanent pain, numbness, and/or weakness of one or several areas of the body; allergic reactions; (i.e.: anaphylactic reaction); and/or death. Furthermore, the patient was informed of those risks and complications associated with the medications. These include, but are not limited to: allergic reactions (i.e.: anaphylactic or anaphylactoid reaction(s)); adrenal axis suppression; blood sugar elevation that in diabetics may result in ketoacidosis or comma; water retention that in patients with history of congestive heart failure may result in shortness of breath, pulmonary edema, and decompensation with resultant heart failure; weight gain; swelling or edema; medication-induced neural toxicity; particulate matter embolism and blood vessel occlusion with resultant organ, and/or nervous system infarction; and/or aseptic necrosis of one or more joints. Finally, the patient was informed that Medicine is not an exact science; therefore, there is also the possibility of unforeseen or unpredictable risks and/or possible complications that may result in a catastrophic outcome. The patient indicated having understood very clearly. We have given the patient no guarantees and we have made no promises. Enough time was given to the patient to ask questions, all of which were answered to the patient's satisfaction. Ms. Fukui has indicated that she wanted to continue with the procedure. Attestation: I, the ordering provider, attest that I have discussed with the patient the benefits, risks, side-effects, alternatives, likelihood of achieving goals, and potential problems during recovery  for the procedure that I have provided informed consent. Date  Time: 04/15/2018 10:37 AM  Pre-Procedure Preparation:  Monitoring: As per clinic protocol. Respiration, ETCO2, SpO2, BP, heart rate and rhythm monitor placed and checked for adequate function Safety Precautions: Patient was assessed for positional comfort and pressure points before starting the procedure. Time-out: I initiated and conducted the "Time-out" before starting the procedure, as per protocol. The patient was asked to participate by confirming the accuracy of the "Time Out" information. Verification of the correct person, site, and procedure were performed and confirmed by me, the nursing staff, and the patient. "Time-out" conducted as per Joint Commission's Universal Protocol (UP.01.01.01). Time: 1119  Description of Procedure:          Target Area: Knee Joint Approach: Just above the Lateral tibial plateau, lateral to the infrapatellar tendon. Area Prepped: Entire knee area, from the mid-thigh to the mid-shin. Prepping solution: ChloraPrep (2% chlorhexidine gluconate and 70% isopropyl alcohol) Safety Precautions: Aspiration looking for blood return was conducted prior to all injections. At no point did we inject any substances, as a needle was being advanced. No attempts were made at seeking any paresthesias. Safe injection practices and needle disposal techniques used. Medications properly checked for expiration dates. SDV (single dose vial) medications used. Description of the Procedure: Protocol guidelines were followed. The patient was placed in position over the fluoroscopy table. The target area was identified and the area prepped in the usual manner. Skin & deeper tissues infiltrated with local anesthetic. Appropriate amount of time allowed to pass for local anesthetics to take effect. The procedure needles were then advanced to the target area. Proper needle placement secured. Negative aspiration confirmed. Solution  injected in intermittent fashion, asking for systemic symptoms every 0.5cc of injectate. The needles were then removed and the area cleansed, making sure to leave some of the prepping solution back to take advantage of its long term bactericidal properties. Vitals:   04/15/18 1033 04/15/18 1039  BP: 136/81 131/90  Pulse: 77 68  Resp: 18   Temp: 98.5 F (36.9 C)   TempSrc: Oral  SpO2: 100%   Weight: 210 lb (95.3 kg)   Height: 5' (1.524 m)     Start Time: 1120 hrs. End Time: 1122 hrs. Materials:  Needle(s) Type: Regular needle Gauge: 22G Length: 3.5-in Medication(s): Please see orders for medications and dosing details.  Imaging Guidance:          Type of Imaging Technique: None used Indication(s): N/A Exposure Time: No patient exposure Contrast: None used. Fluoroscopic Guidance: N/A Ultrasound Guidance: N/A Interpretation: N/A  Antibiotic Prophylaxis:   Anti-infectives (From admission, onward)   None     Indication(s): None identified  Post-operative Assessment:  Post-procedure Vital Signs:  Pulse/HCG Rate: 68  Temp: 98.5 F (36.9 C) Resp: 18 BP: 131/90 SpO2: 100 %  EBL: None  Complications: No immediate post-treatment complications observed by team, or reported by patient.  Note: The patient tolerated the entire procedure well. A repeat set of vitals were taken after the procedure and the patient was kept under observation following institutional policy, for this type of procedure. Post-procedural neurological assessment was performed, showing return to baseline, prior to discharge. The patient was provided with post-procedure discharge instructions, including a section on how to identify potential problems. Should any problems arise concerning this procedure, the patient was given instructions to immediately contact us, at any time, without hesitation. In any case, we plan to contact the patient by telephone for a follow-up status report regarding this  interventional procedure.  Comments:  No additional relevant information.  Plan of Care   Imaging Orders  No imaging studies ordered today    Procedure Orders     KNEE INJECTION     KNEE INJECTION  Medications ordered for procedure: Meds ordered this encounter  Medications  . lidocaine (PF) (XYLOCAINE) 1 % injection 4 mL  . ropivacaine (PF) 2 mg/mL (0.2%) (NAROPIN) injection 4 mL  . Sodium Hyaluronate SOSY 2 mL  . Sodium Hyaluronate SOSY 2 mL   Medications administered: We administered lidocaine (PF), ropivacaine (PF) 2 mg/mL (0.2%), Sodium Hyaluronate, and Sodium Hyaluronate.  See the medical record for exact dosing, route, and time of administration.  Disposition: Discharge home  Discharge Date & Time: 04/15/2018; 1130 hrs.   Physician-requested Follow-up: Return in about 2 weeks (around 04/29/2018) for Procedure (no sedation): (B) Hyalgan #3.  Future Appointments  Date Time Provider Department Center  04/18/2018  1:30 PM Roma Kayser, MD [facility] None  04/30/2018  9:00 AM Delano Metz, MD [facility] None   Primary Care Physician: Gareth Morgan, MD Location: [facility] Outpatient [facility] Management Facility Note by: Oswaldo Done, MD Date: 04/15/2018; Time: 1:41 PM  Disclaimer:  Medicine is not an Visual merchandiser. The only guarantee in medicine is that nothing is guaranteed. It is important to note that the decision to proceed with this intervention was based on the information collected from the patient. The Data and conclusions were drawn from the patient's questionnaire, the interview, and the physical examination. Because the information was provided in large part by the patient, it cannot be guaranteed that it has not been purposely or unconsciously manipulated. Every effort has been made to obtain as much relevant data as possible for this evaluation. It is important to note that the conclusions that lead to this procedure are derived in large part from the  available data. Always take into account that the treatment will also be dependent on availability of resources and existing treatment guidelines, considered by other Pain Management Practitioners as being common knowledge and practice, at the time  of the intervention. For Medico-Legal purposes, it is also important to point out that variation in procedural techniques and pharmacological choices are the acceptable norm. The indications, contraindications, technique, and results of the above procedure should only be interpreted and judged by a Board-Certified Interventional Pain Specialist with extensive familiarity and expertise in the same exact procedure and technique.

## 2018-04-15 ENCOUNTER — Encounter: Payer: Self-pay | Admitting: Pain Medicine

## 2018-04-15 ENCOUNTER — Ambulatory Visit: Payer: Medicare Other | Attending: Pain Medicine | Admitting: Pain Medicine

## 2018-04-15 ENCOUNTER — Other Ambulatory Visit: Payer: Self-pay

## 2018-04-15 VITALS — BP 131/90 | HR 68 | Temp 98.5°F | Resp 18 | Ht 60.0 in | Wt 210.0 lb

## 2018-04-15 DIAGNOSIS — G894 Chronic pain syndrome: Secondary | ICD-10-CM

## 2018-04-15 DIAGNOSIS — M25562 Pain in left knee: Secondary | ICD-10-CM | POA: Diagnosis present

## 2018-04-15 DIAGNOSIS — M25561 Pain in right knee: Secondary | ICD-10-CM | POA: Diagnosis not present

## 2018-04-15 DIAGNOSIS — M170 Bilateral primary osteoarthritis of knee: Secondary | ICD-10-CM

## 2018-04-15 DIAGNOSIS — G8929 Other chronic pain: Secondary | ICD-10-CM | POA: Diagnosis not present

## 2018-04-15 MED ORDER — SODIUM HYALURONATE (VISCOSUP) 20 MG/2ML IX SOSY
2.0000 mL | PREFILLED_SYRINGE | Freq: Once | INTRA_ARTICULAR | Status: AC
  Administered 2018-04-15: 2 mL via INTRA_ARTICULAR

## 2018-04-15 MED ORDER — LIDOCAINE HCL (PF) 1 % IJ SOLN
4.0000 mL | Freq: Once | INTRAMUSCULAR | Status: AC
  Administered 2018-04-15: 4 mL

## 2018-04-15 MED ORDER — ROPIVACAINE HCL 2 MG/ML IJ SOLN
4.0000 mL | Freq: Once | INTRAMUSCULAR | Status: AC
  Administered 2018-04-15: 4 mL via INTRA_ARTICULAR

## 2018-04-15 MED ORDER — LIDOCAINE HCL (PF) 1 % IJ SOLN
INTRAMUSCULAR | Status: AC
  Filled 2018-04-15: qty 5

## 2018-04-15 MED ORDER — ROPIVACAINE HCL 2 MG/ML IJ SOLN
INTRAMUSCULAR | Status: AC
  Filled 2018-04-15: qty 10

## 2018-04-15 NOTE — Progress Notes (Signed)
Safety precautions to be maintained throughout the outpatient stay will include: orient to surroundings, keep bed in low position, maintain call bell within reach at all times, provide assistance with transfer out of bed and ambulation.  

## 2018-04-15 NOTE — Patient Instructions (Signed)
____________________________________________________________________________________________  Post-Procedure Discharge Instructions  Instructions:  Apply ice: Fill a plastic sandwich bag with crushed ice. Cover it with a small towel and apply to injection site. Apply for 15 minutes then remove x 15 minutes. Repeat sequence on day of procedure, until you go to bed. The purpose is to minimize swelling and discomfort after procedure.  Apply heat: Apply heat to procedure site starting the day following the procedure. The purpose is to treat any soreness and discomfort from the procedure.  Food intake: Start with clear liquids (like water) and advance to regular food, as tolerated.   Physical activities: Keep activities to a minimum for the first 8 hours after the procedure.   Driving: If you have received any sedation, you are not allowed to drive for 24 hours after your procedure.  Blood thinner: Restart your blood thinner 6 hours after your procedure. (Only for those taking blood thinners)  Insulin: As soon as you can eat, you may resume your normal dosing schedule. (Only for those taking insulin)  Infection prevention: Keep procedure site clean and dry.  Post-procedure Pain Diary: Extremely important that this be done correctly and accurately. Recorded information will be used to determine the next step in treatment.  Pain evaluated is that of treated area only. Do not include pain from an untreated area.  Complete every hour, on the hour, for the initial 8 hours. Set an alarm to help you do this part accurately.  Do not go to sleep and have it completed later. It will not be accurate.  Follow-up appointment: Keep your follow-up appointment after the procedure. Usually 2 weeks for most procedures. (6 weeks in the case of radiofrequency.) Bring you pain diary.   Expect:  From numbing medicine (AKA: Local Anesthetics): Numbness or decrease in pain.  Onset: Full effect within 15  minutes of injected.  Duration: It will depend on the type of local anesthetic used. On the average, 1 to 8 hours.   From steroids: Decrease in swelling or inflammation. Once inflammation is improved, relief of the pain will follow.  Onset of benefits: Depends on the amount of swelling present. The more swelling, the longer it will take for the benefits to be seen. In some cases, up to 10 days.  Duration: Steroids will stay in the system x 2 weeks. Duration of benefits will depend on multiple posibilities including persistent irritating factors.  Occasional side-effects: Facial flushing (red, warm cheeks) , cramps (if present, drink Gatorade and take over-the-counter Magnesium 450-500 mg once to twice a day).  From procedure: Some discomfort is to be expected once the numbing medicine wears off. This should be minimal if ice and heat are applied as instructed.  Call if:  You experience numbness and weakness that gets worse with time, as opposed to wearing off.  New onset bowel or bladder incontinence. (This applies to Spinal procedures only)  Emergency Numbers:  Durning business hours (Monday - Thursday, 8:00 AM - 4:00 PM) (Friday, 9:00 AM - 12:00 Noon): [phone]  After hours: [phone] ____________________________________________________________________________________________   ____________________________________________________________________________________________  Preparing for your procedure (without sedation)  Instructions: . Oral Intake: Do not eat or drink anything for at least 3 hours prior to your procedure. . Transportation: Unless otherwise stated by your physician, you may drive yourself after the procedure. . Blood Pressure Medicine: Take your blood pressure medicine with a sip of water the morning of the procedure. . Blood thinners: Notify our staff if you are taking any blood   thinners. Depending on which one you take, there will be specific  instructions on how and when to stop it. . Diabetics on insulin: Notify the staff so that you can be scheduled 1st case in the morning. If your diabetes requires high dose insulin, take only  of your normal insulin dose the morning of the procedure and notify the staff that you have done so. . Preventing infections: Shower with an antibacterial soap the morning of your procedure.  . Build-up your immune system: Take 1000 mg of Vitamin C with every meal (3 times a day) the day prior to your procedure. . Antibiotics: Inform the staff if you have a condition or reason that requires you to take antibiotics before dental procedures. . Pregnancy: If you are pregnant, call and cancel the procedure. . Sickness: If you have a cold, fever, or any active infections, call and cancel the procedure. . Arrival: You must be in the facility at least 30 minutes prior to your scheduled procedure. . Children: Do not bring any children with you. . Dress appropriately: Bring dark clothing that you would not mind if they get stained. . Valuables: Do not bring any jewelry or valuables.  Procedure appointments are reserved for interventional treatments only. . No Prescription Refills. . No medication changes will be discussed during procedure appointments. . No disability issues will be discussed.  Reasons to call and reschedule or cancel your procedure: (Following these recommendations will minimize the risk of a serious complication.) . Surgeries: Avoid having procedures within 2 weeks of any surgery. (Avoid for 2 weeks before or after any surgery). . Flu Shots: Avoid having procedures within 2 weeks of a flu shots or . (Avoid for 2 weeks before or after immunizations). . Barium: Avoid having a procedure within 7-10 days after having had a radiological study involving the use of radiological contrast. (Myelograms, Barium swallow or enema study). . Heart attacks: Avoid any elective procedures or surgeries for the  initial 6 months after a "Myocardial Infarction" (Heart Attack). . Blood thinners: It is imperative that you stop these medications before procedures. Let us know if you if you take any blood thinner.  . Infection: Avoid procedures during or within two weeks of an infection (including chest colds or gastrointestinal problems). Symptoms associated with infections include: Localized redness, fever, chills, night sweats or profuse sweating, burning sensation when voiding, cough, congestion, stuffiness, runny nose, sore throat, diarrhea, nausea, vomiting, cold or Flu symptoms, recent or current infections. It is specially important if the infection is over the area that we intend to treat. . Heart and lung problems: Symptoms that may suggest an active cardiopulmonary problem include: cough, chest pain, breathing difficulties or shortness of breath, dizziness, ankle swelling, uncontrolled high or unusually low blood pressure, and/or palpitations. If you are experiencing any of these symptoms, cancel your procedure and contact your primary care physician for an evaluation.  Remember:  Regular Business hours are:  Monday to Thursday 8:00 AM to 4:00 PM  Provider's Schedule: Mikaili Flippin, MD:  Procedure days: Tuesday and Thursday 7:30 AM to 4:00 PM  Bilal Lateef, MD:  Procedure days: Monday and Wednesday 7:30 AM to 4:00 PM ____________________________________________________________________________________________    

## 2018-04-15 NOTE — Addendum Note (Signed)
Addended by: Delano Metz A on: 04/15/2018 01:41 PM   Modules accepted: Orders

## 2018-04-18 ENCOUNTER — Ambulatory Visit: Payer: Medicare Other | Admitting: "Endocrinology

## 2018-04-19 LAB — HEMOGLOBIN A1C
HEMOGLOBIN A1C: 6.4 %{Hb} — AB (ref <5.7)
Mean Plasma Glucose: 137 (calc)
eAG (mmol/L): 7.6 (calc)

## 2018-04-19 LAB — COMPLETE METABOLIC PANEL WITH GFR
AG Ratio: 1.2 (calc) (ref 1.0–2.5)
ALBUMIN MSPROF: 4.1 g/dL (ref 3.6–5.1)
ALKALINE PHOSPHATASE (APISO): 84 U/L (ref 33–130)
ALT: 12 U/L (ref 6–29)
AST: 20 U/L (ref 10–35)
BILIRUBIN TOTAL: 0.7 mg/dL (ref 0.2–1.2)
BUN/Creatinine Ratio: 10 (calc) (ref 6–22)
BUN: 16 mg/dL (ref 7–25)
CHLORIDE: 102 mmol/L (ref 98–110)
CO2: 26 mmol/L (ref 20–32)
Calcium: 9.4 mg/dL (ref 8.6–10.4)
Creat: 1.6 mg/dL — ABNORMAL HIGH (ref 0.60–0.93)
GFR, Est African American: 35 mL/min/{1.73_m2} — ABNORMAL LOW (ref >=60)
GFR, Est Non African American: 30 mL/min/{1.73_m2} — ABNORMAL LOW (ref >=60)
GLOBULIN: 3.5 g/dL (ref 1.9–3.7)
Glucose, Bld: 205 mg/dL — ABNORMAL HIGH (ref 65–139)
POTASSIUM: 4.2 mmol/L (ref 3.5–5.3)
SODIUM: 138 mmol/L (ref 135–146)
Total Protein: 7.6 g/dL (ref 6.1–8.1)

## 2018-04-19 LAB — TSH: TSH: 0.72 mIU/L (ref 0.40–4.50)

## 2018-04-19 LAB — T4, FREE: FREE T4: 1.3 ng/dL (ref 0.8–1.8)

## 2018-04-30 ENCOUNTER — Ambulatory Visit: Payer: Medicare Other | Admitting: Pain Medicine

## 2018-04-30 ENCOUNTER — Ambulatory Visit
Admission: RE | Admit: 2018-04-30 | Discharge: 2018-04-30 | Disposition: A | Discharge status: Home / Self Care | Payer: Medicare Other | Source: Ambulatory Visit | Attending: Anesthesiology | Admitting: Anesthesiology

## 2018-04-30 ENCOUNTER — Other Ambulatory Visit: Payer: Self-pay | Admitting: Anesthesiology

## 2018-04-30 DIAGNOSIS — R52 Pain, unspecified: Secondary | ICD-10-CM

## 2018-04-30 NOTE — Progress Notes (Deleted)
Patient's Name: [name]  MRN: [id number]  Referring Provider: Gareth Morgan, MD  DOB: 04-22-1940  PCP: Gareth Morgan, MD  DOS: 04/30/2018  Note by: Oswaldo Done, MD  Service setting: Ambulatory outpatient  Specialty: Interventional Pain Management  Patient type: Established  Location: [facility] (AMB) Pain Management Facility  Visit type: Interventional Procedure   Primary Reason for Visit: Interventional Pain Management Treatment. CC: No chief complaint on file.  Procedure:          Anesthesia, Analgesia, Anxiolysis:  Type: Therapeutic Intra-Articular Hyalgan Knee Injection #3  Region: Lateral infrapatellar Knee Region Level: Knee Joint Laterality: Bilateral  Type: Local Anesthesia Indication(s): Analgesia         Local Anesthetic: Lidocaine 1-2% Route: Infiltration ([place]/IM) IV Access: Declined Sedation: Declined   Position: Sitting   Indications: 1. Osteoarthritis of knee (Bilateral)   2. Chronic knee pain [facility] Area of Pain) (Bilateral) (L>R)    Pain Score: Pre-procedure:  /10 Post-procedure:  /10  Pre-op Assessment:  [name] is a 78 y.o. (year old), female patient, seen today for interventional treatment. She  has a past surgical history that includes Vesicovaginal fistula closure w/ TAH; Carpal tunnel release - bilateral (1992); Total hip replacement - right (2002); Stomach surgery gastropexy for gastric volvulus (2009); Tubular Adenoma; Shoulder surgery; Spinal cord stimulator insertion; Esophagogastroduodenoscopy (08/12/2007); Colonoscopy (N/A, 03/04/2013); Abdominal hysterectomy; Colonoscopy (06/14/2006); Cerebral aneurysm repair (2004); Eye surgery; Cataract extraction w/ intraocular lens implant (Bilateral); Cardiac catheterization; Tubal ligation; and Total shoulder arthroplasty (Left, 01/30/2017). [name] has a current medication list which includes the following prescription(s): vitamin d3, dicyclomine, doxepin, fluticasone-salmeterol, gabapentin,  glipizide, guaifenesin, insulin pen needle, levothyroxine, omeprazole, proair hfa, and semaglutide(0.25 or 0.5mg /dos), and the following Facility-Administered Medications: vancomycin. Her primarily concern today is the No chief complaint on file.  Initial Vital Signs:  Pulse/HCG Rate:    Temp:   Resp:   BP:   SpO2:    BMI: Estimated body mass index is 41.01 kg/m as calculated from the following:   Height as of 04/15/18: 5' (1.524 m).   Weight as of 04/15/18: 210 lb (95.3 kg).  Risk Assessment: Allergies: Reviewed. She is allergic to oxycodone; codeine; hydrocodone; and naproxen.  Allergy Precautions: None required Coagulopathies: Reviewed. None identified.  Blood-thinner therapy: None at this time Active Infection(s): Reviewed. None identified. [name] is afebrile  Site Confirmation: [name] was asked to confirm the procedure and laterality before marking the site Procedure checklist: Completed Consent: Before the procedure and under the influence of no sedative(s), amnesic(s), or anxiolytics, the patient was informed of the treatment options, risks and possible complications. To fulfill our ethical and legal obligations, as recommended by the American Medical Association's Code of Ethics, I have informed the patient of my clinical impression; the nature and purpose of the treatment or procedure; the risks, benefits, and possible complications of the intervention; the alternatives, including doing nothing; the risk(s) and benefit(s) of the alternative treatment(s) or procedure(s); and the risk(s) and benefit(s) of doing nothing. The patient was provided information about the general risks and possible complications associated with the procedure. These may include, but are not limited to: failure to achieve desired goals, infection, bleeding, organ or nerve damage, allergic reactions, paralysis, and death. In addition, the patient was informed of those risks and complications  associated to the procedure, such as failure to decrease pain; infection; bleeding; organ or nerve damage with subsequent damage to sensory, motor, and/or autonomic systems, resulting in permanent pain, numbness, and/or weakness  of one or several areas of the body; allergic reactions; (i.e.: anaphylactic reaction); and/or death. Furthermore, the patient was informed of those risks and complications associated with the medications. These include, but are not limited to: allergic reactions (i.e.: anaphylactic or anaphylactoid reaction(s)); adrenal axis suppression; blood sugar elevation that in diabetics may result in ketoacidosis or comma; water retention that in patients with history of congestive heart failure may result in shortness of breath, pulmonary edema, and decompensation with resultant heart failure; weight gain; swelling or edema; medication-induced neural toxicity; particulate matter embolism and blood vessel occlusion with resultant organ, and/or nervous system infarction; and/or aseptic necrosis of one or more joints. Finally, the patient was informed that Medicine is not an exact science; therefore, there is also the possibility of unforeseen or unpredictable risks and/or possible complications that may result in a catastrophic outcome. The patient indicated having understood very clearly. We have given the patient no guarantees and we have made no promises. Enough time was given to the patient to ask questions, all of which were answered to the patient's satisfaction. [name] has indicated that she wanted to continue with the procedure. Attestation: I, the ordering provider, attest that I have discussed with the patient the benefits, risks, side-effects, alternatives, likelihood of achieving goals, and potential problems during recovery for the procedure that I have provided informed consent. Date  Time: {CHL [facility]-PAIN TIME CHOICES:[id number]}  Pre-Procedure Preparation:  Monitoring: As  per clinic protocol. Respiration, ETCO2, SpO2, BP, heart rate and rhythm monitor placed and checked for adequate function Safety Precautions: Patient was assessed for positional comfort and pressure points before starting the procedure. Time-out: I initiated and conducted the "Time-out" before starting the procedure, as per protocol. The patient was asked to participate by confirming the accuracy of the "Time Out" information. Verification of the correct person, site, and procedure were performed and confirmed by me, the nursing staff, and the patient. "Time-out" conducted as per Joint Commission's Universal Protocol (UP.01.01.01). Time:    Description of Procedure:          Target Area: Knee Joint Approach: Just above the Lateral tibial plateau, lateral to the infrapatellar tendon. Area Prepped: Entire knee area, from the mid-thigh to the mid-shin. Prepping solution: ChloraPrep (2% chlorhexidine gluconate and 70% isopropyl alcohol) Safety Precautions: Aspiration looking for blood return was conducted prior to all injections. At no point did we inject any substances, as a needle was being advanced. No attempts were made at seeking any paresthesias. Safe injection practices and needle disposal techniques used. Medications properly checked for expiration dates. SDV (single dose vial) medications used. Description of the Procedure: Protocol guidelines were followed. The patient was placed in position over the fluoroscopy table. The target area was identified and the area prepped in the usual manner. Skin & deeper tissues infiltrated with local anesthetic. Appropriate amount of time allowed to pass for local anesthetics to take effect. The procedure needles were then advanced to the target area. Proper needle placement secured. Negative aspiration confirmed. Solution injected in intermittent fashion, asking for systemic symptoms every 0.5cc of injectate. The needles were then removed and the area cleansed,  making sure to leave some of the prepping solution back to take advantage of its long term bactericidal properties. There were no vitals filed for this visit.  Start Time:   hrs. End Time:   hrs. Materials:  Needle(s) Type: Regular needle Gauge: 25G Length: 1.5-in Medication(s): Please see orders for medications and dosing details.  Imaging Guidance:  Type of Imaging Technique: None used Indication(s): N/A Exposure Time: No patient exposure Contrast: None used. Fluoroscopic Guidance: N/A Ultrasound Guidance: N/A Interpretation: N/A  Antibiotic Prophylaxis:   Anti-infectives (From admission, onward)   None     Indication(s): None identified  Post-operative Assessment:  Post-procedure Vital Signs:  Pulse/HCG Rate:    Temp:   Resp:   BP:   SpO2:    EBL: None  Complications: No immediate post-treatment complications observed by team, or reported by patient.  Note: The patient tolerated the entire procedure well. A repeat set of vitals were taken after the procedure and the patient was kept under observation following institutional policy, for this type of procedure. Post-procedural neurological assessment was performed, showing return to baseline, prior to discharge. The patient was provided with post-procedure discharge instructions, including a section on how to identify potential problems. Should any problems arise concerning this procedure, the patient was given instructions to immediately contact us, at any time, without hesitation. In any case, we plan to contact the patient by telephone for a follow-up status report regarding this interventional procedure.  Comments:  No additional relevant information.  Plan of Care   Imaging Orders  No imaging studies ordered today   Procedure Orders    No procedure(s) ordered today    Medications ordered for procedure: No orders of the defined types were placed in this encounter.  Medications administered: Brandyce H.  Romeo [name] had no medications administered during this visit.  See the medical record for exact dosing, route, and time of administration.  Disposition: Discharge home  Discharge Date & Time: 04/30/2018;   hrs.   Physician-requested Follow-up: No follow-ups on file.  Future Appointments  Date Time Provider Department Center  04/30/2018  9:00 AM Delano MetzNaveira, Jyden Kromer, MD [facility] None  05/07/2018 11:00 AM Roma KayserNida, Gebreselassie W, MD [facility] None   Primary Care Physician: Gareth MorganKnowlton, Steve, MD [facility] Note by: Oswaldo DoneFrancisco A Danel Requena, MD Date: 04/30/2018; Time: 5:49 AM  Disclaimer:  Medicine is not an Visual merchandiserexact science. The only guarantee in medicine is that nothing is guaranteed. It is important to note that the decision to proceed with this intervention was based on the information collected from the patient. The Data and conclusions were drawn from the patient's questionnaire, the interview, and the physical examination. Because the information was provided in large part by the patient, it cannot be guaranteed that it has not been purposely or unconsciously manipulated. Every effort has been made to obtain as much relevant data as possible for this evaluation. It is important to note that the conclusions that lead to this procedure are derived in large part from the available data. Always take into account that the treatment will also be dependent on availability of resources and existing treatment guidelines, considered by other Pain Management Practitioners as being common knowledge and practice, at the time of the intervention. For Medico-Legal purposes, it is also important to point out that variation in procedural techniques and pharmacological choices are the acceptable norm. The indications, contraindications, technique, and results of the above procedure should only be interpreted and judged by a Board-Certified Interventional Pain Specialist with extensive  familiarity and expertise in the same exact procedure and technique.

## 2018-05-06 ENCOUNTER — Ambulatory Visit: Payer: Medicare Other | Admitting: "Endocrinology

## 2018-05-06 ENCOUNTER — Encounter: Payer: Self-pay | Admitting: "Endocrinology

## 2018-05-06 VITALS — BP 159/74 | HR 71 | Ht 60.0 in | Wt 216.0 lb

## 2018-05-06 DIAGNOSIS — N183 Chronic kidney disease, stage 3 (moderate): Secondary | ICD-10-CM

## 2018-05-06 DIAGNOSIS — E039 Hypothyroidism, unspecified: Secondary | ICD-10-CM | POA: Diagnosis not present

## 2018-05-06 DIAGNOSIS — I10 Essential (primary) hypertension: Secondary | ICD-10-CM | POA: Diagnosis not present

## 2018-05-06 DIAGNOSIS — E1122 Type 2 diabetes mellitus with diabetic chronic kidney disease: Secondary | ICD-10-CM

## 2018-05-06 NOTE — Progress Notes (Signed)
Follow-up note       05/06/2018, 1:26 PM   Subjective:    Patient ID: [name], female    DOB: 08/07/1940.  CERISSA ZEIGER is being seen in follow-up for management of currently uncontrolled symptomatic type 2 diabetes comp located by stage  3-4 renal insufficiency, hyperlipidemia, hypertension. PMD :  Gareth Morgan, MD.   Past Medical History:  Diagnosis Date  . Anxiety   . Arthritis   . Asthma   . Carotid artery aneurysm [facility])    Right s/p endovascular treatment 2004  . Carpal tunnel syndrome   . Cataract   . CKD (chronic kidney disease) stage 3, GFR 30-59 ml/min (HCC)   . Depression   . Essential hypertension, benign   . GERD (gastroesophageal reflux disease)   . Glaucoma   . History of recurrent UTIs   . Hx of colonic polyps   . Hx of migraines   . Hypothyroidism   . IBS (irritable bowel syndrome)   . Low back pain   . Morbid obesity (HCC)   . Neurogenic bladder   . Osteoporosis   . Pneumonia   . Sleep apnea    Intolerant to CPAP   . Stroke (HCC)   . Type 2 diabetes mellitus with diabetic neuropathy [facility])    Past Surgical History:  Procedure Laterality Date  . ABDOMINAL HYSTERECTOMY    . CARDIAC CATHETERIZATION     1980 or 1990  . Carpal tunnel release - bilateral  1992  . CATARACT EXTRACTION W/ INTRAOCULAR LENS IMPLANT Bilateral   . CEREBRAL ANEURYSM REPAIR  2004   Coil   . COLONOSCOPY N/A 03/04/2013   Dr. Darrick Penna: moderate diverticulosis, moderate internal hemorrhoids. Next colonoscopy in 5-10 years with history of simple adenomas.  . COLONOSCOPY  06/14/2006   ZOX:WRUEAV ADENOMAS(2), pTICS, Hoffman lipoma  . ESOPHAGOGASTRODUODENOSCOPY  08/12/2007   WUJ:WJXBJY esophagus without evidence of Barrett's,mass, erosion ulceration or stricture.  The GE junction was at 40 cm.  The Bravo  capsule was placed at 34 cm/ Unable to appreciate a hiatal hernia in the retroflexed viewNormal duodenal  bulb and second portion of the duodenum. mild gastritis.   Marland Kitchen EYE SURGERY    . SHOULDER SURGERY     Rotator cuff repair, 2012  . SPINAL CORD STIMULATOR INSERTION    . Stomach surgery gastropexy for gastric volvulus  2009  . Total hip replacement - right  2002  . TOTAL SHOULDER ARTHROPLASTY Left 01/30/2017   Procedure: LEFT TOTAL SHOULDER ARTHROPLASTY;  Surgeon: Cammy Copa, MD;  Location: [facility] OR;  Service: Orthopedics;  Laterality: Left;  . TUBAL LIGATION    . Tubular Adenoma    . VESICOVAGINAL FISTULA CLOSURE W/ TAH     Social History   Socioeconomic History  . Marital status: Legally Separated    Spouse name: Not on file  . Number of children: Not on file  . Years of education: 12th grade  . Highest education level: Not on file  Occupational History  . Occupation: Retired    Comment: Building surveyor: RETIRED  Social Needs  . Financial resource strain: Not on file  . Food  insecurity:    Worry: Not on file    Inability: Not on file  . Transportation needs:    Medical: Not on file    Non-medical: Not on file  Tobacco Use  . Smoking status: Former Smoker    Types: Cigarettes  . Smokeless tobacco: Never Used  Substance and Sexual Activity  . Alcohol use: No  . Drug use: No  . Sexual activity: Not on file  Lifestyle  . Physical activity:    Days per week: Not on file    Minutes per session: Not on file  . Stress: Not on file  Relationships  . Social connections:    Talks on phone: Not on file    Gets together: Not on file    Attends religious service: Not on file    Active member of club or organization: Not on file    Attends meetings of clubs or organizations: Not on file    Relationship status: Not on file  Other Topics Concern  . Not on file  Social History Narrative  . Not on file   Outpatient Encounter Medications as of 05/06/2018  Medication Sig  . Cholecalciferol (VITAMIN D3) 5000 units CAPS Take 5,000 Units by mouth daily.  Marland Kitchen dicyclomine  (BENTYL) 10 MG capsule Take 1 capsule 2-3 times daily 30 minutes before a meal for abdominal cramping and loose stools. (Patient taking differently: Take 10 mg by mouth 3 (three) times daily as needed for spasms. )  . doxepin (SINEQUAN) 25 MG capsule Take 75 mg by mouth at bedtime.   . Fluticasone-Salmeterol (ADVAIR) 250-50 MCG/DOSE AEPB Inhale 1 puff into the lungs 2 (two) times daily as needed (for shortness of breath).  . gabapentin (NEURONTIN) 100 MG capsule TAKE ONE CAPSULE BY MOUTH 2 TIMES A DAY AS NEEDED FOR PAIN.  . glipiZIDE (GLUCOTROL) 5 MG tablet TAKE 1 TABLET DAILY BEFORE BREAKFAST.  Marland Kitchen guaiFENesin (MUCINEX) 600 MG 12 hr tablet Take 600 mg by mouth 2 (two) times daily as needed for cough or to loosen phlegm.  . Insulin Pen Needle (B-D ULTRAFINE III SHORT PEN) 31G X 8 MM MISC 1 each by Does not apply route as directed.  Marland Kitchen levothyroxine (SYNTHROID, LEVOTHROID) 75 MCG tablet Take 1 tablet (75 mcg total) by mouth daily.  Marland Kitchen omeprazole (PRILOSEC) 20 MG capsule Take 20 mg by mouth 2 (two) times daily.   Marland Kitchen PROAIR HFA 108 (90 Base) MCG/ACT inhaler Inhale 2 puffs into the lungs every 6 (six) hours as needed for wheezing.   . Semaglutide,0.25 or 0.5MG /DOS, (OZEMPIC, 0.25 OR 0.5 MG/DOSE,) 2 MG/1.5ML SOPN Inject 0.5 mg into the skin once a week.   Facility-Administered Encounter Medications as of 05/06/2018  Medication  . vancomycin (VANCOCIN) IVPB 1000 mg/200 mL premix    ALLERGIES: Allergies  Allergen Reactions  . Oxycodone Anaphylaxis and Itching  . Codeine Rash  . Hydrocodone Itching and Nausea And Vomiting  . Naproxen Nausea And Vomiting and Other (See Comments)    Hallucinations    VACCINATION STATUS: Immunization History  Administered Date(s) Administered  . Influenza Whole 12/25/2007  . Pneumococcal Polysaccharide-23 12/25/2007    Diabetes  She presents for her follow-up diabetic visit. She has type 2 diabetes mellitus. Onset time: She was diagnosed at approximate age of 48  years.. Her disease course has been improving. There are no hypoglycemic associated symptoms. Pertinent negatives for hypoglycemia include no confusion, headaches, pallor or seizures. Associated symptoms include fatigue. Pertinent negatives for diabetes include no chest pain,  no polydipsia, no polyphagia and no polyuria. There are no hypoglycemic complications. Symptoms are improving. Diabetic complications include nephropathy. Risk factors for coronary artery disease include diabetes mellitus, dyslipidemia, family history, hypertension, obesity, sedentary lifestyle and tobacco exposure. Current diabetic treatment includes oral agent (monotherapy). Her weight is fluctuating minimally. She is following a generally unhealthy diet. When asked about meal planning, she reported none. She has not had a previous visit with a dietitian. She never participates in exercise. (She returns with improved profile with A1c of 6.4%.) An ACE inhibitor/angiotensin II receptor blocker is being taken. Eye exam is current.  Hyperlipidemia  This is a chronic problem. The current episode started more than 1 year ago. The problem is uncontrolled. Recent lipid tests were reviewed and are high. Exacerbating diseases include diabetes, hypothyroidism and obesity. Pertinent negatives include no chest pain, myalgias or shortness of breath. She is currently on no antihyperlipidemic treatment. Risk factors for coronary artery disease include diabetes mellitus, dyslipidemia, hypertension, obesity, a sedentary lifestyle and post-menopausal.  Hypertension  This is a chronic problem. The current episode started more than 1 year ago. The problem is controlled. Pertinent negatives include no chest pain, headaches, palpitations or shortness of breath. Risk factors for coronary artery disease include diabetes mellitus, dyslipidemia, obesity, sedentary lifestyle, smoking/tobacco exposure and post-menopausal state. Past treatments include angiotensin  blockers and diuretics.    Review of Systems  Constitutional: Positive for fatigue. Negative for chills, fever and unexpected weight change.  HENT: Negative for trouble swallowing and voice change.   Eyes: Negative for visual disturbance.  Respiratory: Negative for cough, shortness of breath and wheezing.   Cardiovascular: Negative for chest pain, palpitations and leg swelling.  Gastrointestinal: Negative for diarrhea, nausea and vomiting.  Endocrine: Negative for cold intolerance, heat intolerance, polydipsia, polyphagia and polyuria.  Musculoskeletal: Positive for gait problem. Negative for arthralgias and myalgias.  Skin: Negative for color change, pallor, rash and wound.  Neurological: Negative for seizures and headaches.  Psychiatric/Behavioral: Negative for confusion and suicidal ideas.    Objective:    BP (!) 159/74   Pulse 71   Ht 5' (1.524 m)   Wt 216 lb (98 kg)   BMI 42.18 kg/m   Wt Readings from Last 3 Encounters:  05/06/18 216 lb (98 kg)  04/15/18 210 lb (95.3 kg)  03/21/18 225 lb (102.1 kg)     Physical Exam  Constitutional: She is oriented to person, place, and time. She appears well-developed.  Uses a cane to walk due to recent hip replacement and diffuse joint arthritis.   HENT:  Head: Normocephalic and atraumatic.  Eyes: EOM are normal.  Neck: Normal range of motion. Neck supple. No tracheal deviation present. No thyromegaly present.  Cardiovascular: Normal rate and regular rhythm.  Pulmonary/Chest: Effort normal and breath sounds normal.  Abdominal: Soft. Bowel sounds are normal. There is no abdominal tenderness. There is no guarding.  Musculoskeletal:        General: No edema.  Neurological: She is alert and oriented to person, place, and time. She has normal reflexes. No cranial nerve deficit. Coordination normal.  Skin: Skin is warm and dry. No rash noted. No erythema. No pallor.  Psychiatric: She has a normal mood and affect. Judgment normal.     CMP ( most recent) CMP     Component Value Date/Time   NA 138 04/18/2018 1306   NA 142 07/10/2012 1135   K 4.2 04/18/2018 1306   K 4.4 07/10/2012 1135   CL 102 04/18/2018 1306  CL 111 (H) 07/10/2012 1135   CO2 26 04/18/2018 1306   CO2 25 07/10/2012 1135   GLUCOSE 205 (H) 04/18/2018 1306   GLUCOSE 148 (H) 07/10/2012 1135   BUN 16 04/18/2018 1306   BUN 22 (H) 07/10/2012 1135   CREATININE 1.60 (H) 04/18/2018 1306   CALCIUM 9.4 04/18/2018 1306   CALCIUM 8.8 07/10/2012 1135   PROT 7.6 04/18/2018 1306   ALBUMIN 4.0 10/23/2017 1422   AST 20 04/18/2018 1306   ALT 12 04/18/2018 1306   ALKPHOS 98 10/23/2017 1422   BILITOT 0.7 04/18/2018 1306   GFRNONAA 30 (L) 04/18/2018 1306   GFRAA 35 (L) 04/18/2018 1306     Diabetic Labs (most recent): Lab Results  Component Value Date   HGBA1C 6.4 (H) 04/18/2018   HGBA1C 7.0 (H) 12/10/2017   HGBA1C 6.4 (H) 08/08/2017     Lipid Panel ( most recent) Lipid Panel     Component Value Date/Time   CHOL 171 05/09/2017 1536   TRIG 120 05/09/2017 1536   HDL 62 05/09/2017 1536   CHOLHDL 2.8 05/09/2017 1536   VLDL 22 06/23/2008 2159   LDLCALC 87 05/09/2017 1536      Lab Results  Component Value Date   TSH 0.72 04/18/2018   TSH 1.19 12/10/2017   TSH 0.73 08/08/2017   TSH 0.31 (L) 05/09/2017   TSH 2.886 11/20/2007   TSH 1.589 11/06/2007   FREET4 1.3 04/18/2018   FREET4 1.0 12/10/2017   FREET4 1.1 08/08/2017   FREET4 1.1 05/09/2017      Assessment & Plan:   1. Type 2 diabetes mellitus with stage 3 chronic kidney disease, without long-term current use of insulin (HCC)  - Jeb Leveringnez H Disla has currently uncontrolled symptomatic type 2 DM since  78 years of age. -She came with controlled glycemic profile to target, A1c at 6.4% progressively improving from 8.1%.     -Recent labs reviewed.  -her diabetes is complicated by stage 3 renal insufficiency, obesity/sedentary life, history of smoking and Jeb Leveringnez H Forlenza remains at a high  risk for more acute and chronic complications which include CAD, CVA, CKD, retinopathy, and neuropathy. These are all discussed in detail with the patient.  - I have counseled her on diet management and weight loss, by adopting a carbohydrate restricted/protein rich diet.  - Patient admits there is a room for improvement in her diet and drink choices. -  Suggestion is made for her to avoid simple carbohydrates  from her diet including Cakes, Sweet Desserts / Pastries, Ice Cream, Soda (diet and regular), Sweet Tea, Candies, Chips, Cookies, Store Bought Juices, Alcohol in Excess of  1-2 drinks a day, Artificial Sweeteners, and "Sugar-free" Products. This will help patient to have stable blood glucose profile and potentially avoid unintended weight gain.   - I encouraged her to switch to  unprocessed or minimally processed complex starch and increased protein intake (animal or plant source), fruits, and vegetables.  - she is advised to stick to a routine mealtimes to eat 3 meals  a day and avoid unnecessary snacks ( to snack only to correct hypoglycemia).   - she has been scheduled with Norm SaltPenny Crumpton, RDN, CDE for individualized diabetes education- consult pending.  - I have approached her with the following individualized plan to manage diabetes and patient agrees:   -Based on her presentation with controlled glycemic profile, she will not require insulin treatment at this time.  -She could not afford the co-pays for Tradjenta nor Januvia.   -  She is advised to continue  Ozempic 0.5 mg subcutaneously weekly with a sample from clinic.  -She is also advised to continue glipizide 5 mg p.o. daily at breakfast.   -She is asked to monitor blood glucose 2 times daily -before breakfast and at bedtime.  -Patient is encouraged to call clinic for blood glucose levels less than 70 or above 300 mg /dl.  - She is status post evaluation by nephrologist and urologist.  - Patient specific target  A1c;  LDL,  HDL, Triglycerides, and  Waist Circumference were discussed in detail.  2) BP/HTN: Her blood pressure is not controlled to target.  She is advised to continue her current blood pressure medications including Cozaar 25 mg p.o. Daily.  3) Lipids/HPL: Her recent lipid panel showed controlled LDL at 87, proving from 103. -She did not bring her medications for review today.  Her medication list  does not include statin. Patient is advised to bring all her medications next visit.  4)  Weight/Diet: CDE Consult is progress,  exercise, and detailed carbohydrates information provided.  5) Hypothyroidism - Etiology unclear.  Her thyroid function tests are consistent with appropriate replacement.  I advised her to continue levothyroxine 75 mcg p.o. every morning.  - We discussed about the correct intake of her thyroid hormone, on empty stomach at fasting, with water, separated by at least 30 minutes from breakfast and other medications,  and separated by more than 4 hours from calcium, iron, multivitamins, acid reflux medications (PPIs). -Patient is made aware of the fact that thyroid hormone replacement is needed for life, dose to be adjusted by periodic monitoring of thyroid function tests.   6) Chronic Care/Health Maintenance:  -she  is on ARB   is encouraged to continue to follow up with Ophthalmology, Dentist,  Podiatrist at least yearly or according to recommendations, and advised to   stay away from smoking. I have recommended yearly flu vaccine and pneumonia vaccination at least every 5 years; moderate intensity exercise for up to 150 minutes weekly; and  sleep for at least 7 hours a day.  - I advised patient to maintain close follow up with Gareth Morgan, MD for primary care needs.  - Time spent with the patient: 25 min, of which >50% was spent in reviewing her  current and  previous labs/studies, previous treatments, and medications doses and developing a plan for long-term care based on the  latest recommendations for standards of care. Jeb Levering participated in the discussions, expressed understanding, and voiced agreement with the above plans.  All questions were answered to her satisfaction. she is encouraged to contact clinic should she have any questions or concerns prior to her return visit.   Follow up plan: - Return in about 6 months (around 11/04/2018) for Follow up with Pre-visit Labs, Meter, and Logs.  Marquis Lunch, MD [facility] [address] [place], [place] [address] Phone: [phone]  Fax: [phone]    05/06/2018, 1:26 PM  This note was partially dictated with voice recognition software. Similar sounding words can be transcribed inadequately or may not  be corrected upon review.

## 2018-05-06 NOTE — Patient Instructions (Signed)

## 2018-05-07 ENCOUNTER — Ambulatory Visit: Payer: Medicare Other | Admitting: "Endocrinology

## 2018-11-04 ENCOUNTER — Ambulatory Visit: Payer: Medicare Other | Admitting: "Endocrinology

## 2018-11-12 IMAGING — CT CT L SPINE W/ CM
1 of 7 series · 5 of 14 positions shown, 7 images · non-contrast
Comparison: Lumbar MRI 05/17/2011

CLINICAL DATA: Posterior left leg pain. Low back pain improved
after spinal stimulator placement.
TECHNIQUE: Contiguous axial images were obtained through the Lumbar spine after
the intrathecal infusion of infusion. Coronal and sagittal
reconstructions were obtained of the axial image sets.

[Series 3: l spine soft · axial · 0.31mm/px · z∈[-228,-96]mm · 5 of 66 slices shown, 7 images]
[im 11/66  soft-tissue]
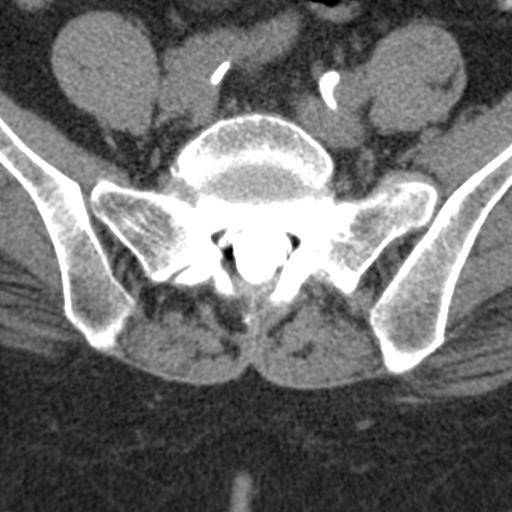
[im 11/66  bone]
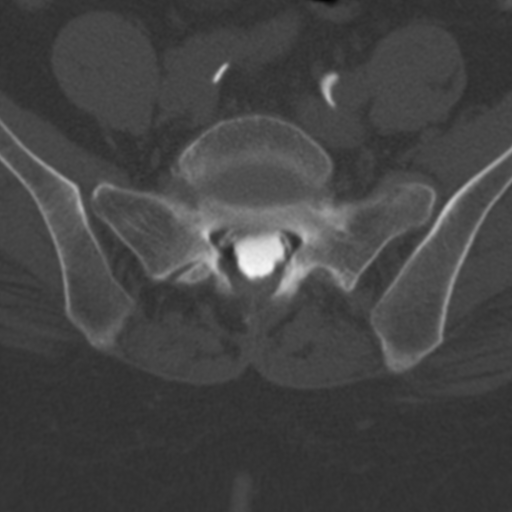
[im 22/66  bone]
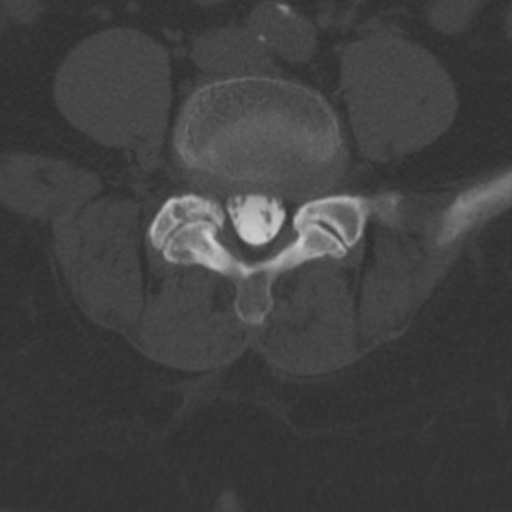
[im 33/66  bone]
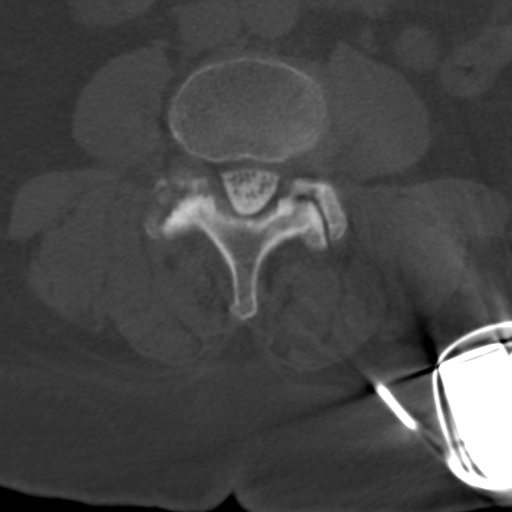
[im 44/66  bone]
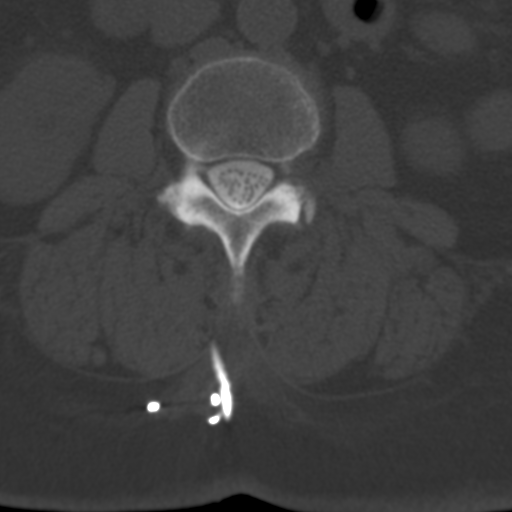
[im 55/66  soft-tissue]
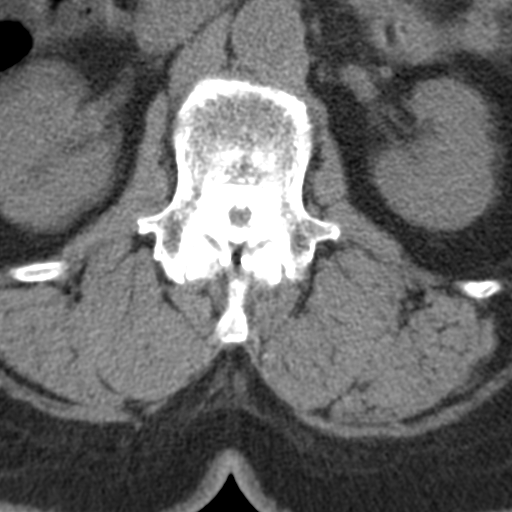
[im 55/66  bone]
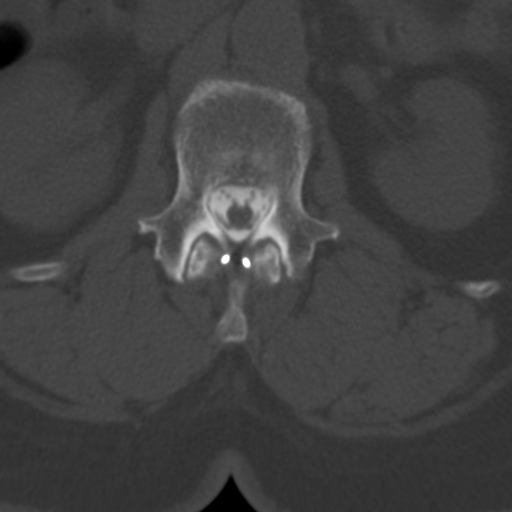

[5 of 14 positions shown; findings below may reference images not displayed]

EXAM:
LUMBAR MYELOGRAM

FLUOROSCOPY TIME:  Radiation Exposure Index (as provided by the
fluoroscopic device): 221.06 microGray*m^2

Fluoroscopy Time (in minutes and seconds):  15 seconds

PROCEDURE:
After thorough discussion of risks and benefits of the procedure
including bleeding, infection, injury to nerves, blood vessels,
adjacent structures as well as headache and CSF leak, written and
oral informed consent was obtained. Consent was obtained by Dr.
Cc Gann. Time out form was completed.

Patient was positioned prone on the fluoroscopy table. Local
anesthesia was provided with 1% lidocaine without epinephrine after
prepped and draped in the usual sterile fashion. Puncture was
performed at L3-4 using a 3 1/2 inch 22-gauge spinal needle via a
right interlaminar approach. Using a single pass through the dura,
the needle was placed within the thecal sac, with return of clear
CSF. 15 mL of Isovue T-XWW was injected into the thecal sac, with
normal opacification of the nerve roots and cauda equina consistent
with free flow within the subarachnoid space.

I personally performed the lumbar puncture and administered the
intrathecal contrast. I also personally supervised acquisition of
the myelogram images.
FINDINGS: LUMBAR MYELOGRAM FINDINGS:

L5 is mildly transitional with an enlarged left-sided transverse
process which demonstrates a pseudoarticulation with the sacrum.
There is slight anterolisthesis of L2 on L3, L3 on L4, and L4 on L5,
greatest at L3-4. These do not significantly worsen with flexion,
and each appears to partially reduce with extension. Ventral
extradural defects are present throughout the lumbar spine, most
notable at L1-2 and L3-4. No high-grade spinal stenosis or nerve
root sleeve cut off is identified. A spinal cord stimulator and hip
arthroplasty are partially visualized.

CT LUMBAR MYELOGRAM FINDINGS:

Minimal anterolisthesis of L2 on L3, L3 on L4, and L4 on L5 is
similar to the prior MRI. No fracture or suspicious osseous lesion
is identified. A spinal cord stimulator enters the dorsal epidural
space at the T12-L1 level and courses superiorly into the thoracic
spine. The conus medullaris terminates at the upper L2 level.
Aortoiliac atherosclerosis is noted without aneurysm.

T12-L1: Mild disc bulging and right greater than left facet
hypertrophy without stenosis.

L1-2: Circumferential disc bulging and mild facet hypertrophy result
in minimal right neural foraminal stenosis without spinal stenosis,
unchanged.

L2-3: Disc bulging, left foraminal and extraforaminal disc
protrusion, and moderate facet arthrosis result in mild right and
moderate left neural foraminal stenosis without spinal stenosis,
unchanged. Disc could affect the exiting left L2 nerve root,
although frank neural compression is not evident.

L3-4: Circumferential disc bulging and severe facet arthrosis result
in moderate right greater than left neural foraminal stenosis. Left
neural foraminal stenosis has mildly progressed, and disc could
affect the left L3 nerve lateral to the foramen. No spinal stenosis.

L4-5: Disc bulging and severe right and moderate left facet
arthrosis result in mild right and moderate left neural foraminal
stenosis. Left neural foraminal stenosis has progressed due to
increased leftward disc bulging/superimposed left foraminal disc
protrusion and could affect the exiting left L4 nerve root though
frank neural compression is not evident. No spinal stenosis.

L5-S1: Mild disc bulging, mild enlargement of a right foraminal and
extraforaminal disc protrusion, and mild facet arthrosis result in
new mild right neural foraminal stenosis without spinal stenosis.
IMPRESSION: 1. Lumbar spondylosis and advanced facet arthrosis without spinal
stenosis.
2. Moderate left neural foraminal stenosis at L3-4 and L4-5, both
mildly progressed from 9471.
3. Unchanged moderate left neural foraminal stenosis at L2-3.
4.  Aortic Atherosclerosis (TW4W7-4L1.1).

## 2019-04-08 ENCOUNTER — Other Ambulatory Visit: Payer: Self-pay

## 2019-04-08 ENCOUNTER — Other Ambulatory Visit: Payer: Self-pay | Admitting: "Endocrinology

## 2019-04-08 DIAGNOSIS — N183 Chronic kidney disease, stage 3 unspecified: Secondary | ICD-10-CM

## 2019-04-08 DIAGNOSIS — E039 Hypothyroidism, unspecified: Secondary | ICD-10-CM

## 2019-04-22 ENCOUNTER — Ambulatory Visit: Payer: Medicare Other | Admitting: "Endocrinology

## 2019-04-23 LAB — COMPLETE METABOLIC PANEL WITH GFR
AG Ratio: 1.1 (calc) (ref 1.0–2.5)
ALT: 27 U/L (ref 6–29)
AST: 25 U/L (ref 10–35)
Albumin: 3.8 g/dL (ref 3.6–5.1)
Alkaline phosphatase (APISO): 89 U/L (ref 37–153)
BUN/Creatinine Ratio: 17 (calc) (ref 6–22)
BUN: 24 mg/dL (ref 7–25)
CO2: 24 mmol/L (ref 20–32)
Calcium: 8.4 mg/dL — ABNORMAL LOW (ref 8.6–10.4)
Chloride: 102 mmol/L (ref 98–110)
Creat: 1.45 mg/dL — ABNORMAL HIGH (ref 0.60–0.88)
GFR, Est African American: 39 mL/min/{1.73_m2} — ABNORMAL LOW (ref >=60)
GFR, Est Non African American: 34 mL/min/{1.73_m2} — ABNORMAL LOW (ref >=60)
Globulin: 3.4 g/dL (calc) (ref 1.9–3.7)
Glucose, Bld: 324 mg/dL — ABNORMAL HIGH (ref 65–99)
Potassium: 4.4 mmol/L (ref 3.5–5.3)
Sodium: 135 mmol/L (ref 135–146)
Total Bilirubin: 0.4 mg/dL (ref 0.2–1.2)
Total Protein: 7.2 g/dL (ref 6.1–8.1)

## 2019-04-23 LAB — HEMOGLOBIN A1C
Hgb A1c MFr Bld: 10.3 % of total Hgb — ABNORMAL HIGH (ref <5.7)
Mean Plasma Glucose: 249 (calc)
eAG (mmol/L): 13.8 (calc)

## 2019-04-23 LAB — T4, FREE: Free T4: 0.8 ng/dL (ref 0.8–1.8)

## 2019-04-23 LAB — TSH: TSH: 1.34 mIU/L (ref 0.40–4.50)

## 2019-05-05 ENCOUNTER — Other Ambulatory Visit: Payer: Self-pay

## 2019-05-05 ENCOUNTER — Ambulatory Visit (INDEPENDENT_AMBULATORY_CARE_PROVIDER_SITE_OTHER): Payer: Medicare Other | Admitting: "Endocrinology

## 2019-05-05 ENCOUNTER — Encounter: Payer: Self-pay | Admitting: "Endocrinology

## 2019-05-05 ENCOUNTER — Ambulatory Visit: Payer: Medicare Other | Admitting: "Endocrinology

## 2019-05-05 VITALS — BP 166/88 | HR 80 | Ht 60.0 in | Wt 218.6 lb

## 2019-05-05 DIAGNOSIS — I10 Essential (primary) hypertension: Secondary | ICD-10-CM | POA: Diagnosis not present

## 2019-05-05 DIAGNOSIS — N183 Chronic kidney disease, stage 3 unspecified: Secondary | ICD-10-CM | POA: Diagnosis not present

## 2019-05-05 DIAGNOSIS — E1122 Type 2 diabetes mellitus with diabetic chronic kidney disease: Secondary | ICD-10-CM

## 2019-05-05 DIAGNOSIS — E039 Hypothyroidism, unspecified: Secondary | ICD-10-CM | POA: Diagnosis not present

## 2019-05-05 LAB — GLUCOSE, POCT (MANUAL RESULT ENTRY): POC Glucose: 333 mg/dl — AB (ref 70–99)

## 2019-05-05 MED ORDER — OZEMPIC (0.25 OR 0.5 MG/DOSE) 2 MG/1.5ML ~~LOC~~ SOPN: 0.5000 mg | PEN_INJECTOR | SUBCUTANEOUS | 3 refills | Status: AC

## 2019-05-05 MED ORDER — ONETOUCH VERIO VI STRP: ORAL_STRIP | 12 refills | Status: AC

## 2019-05-05 MED ORDER — GLIPIZIDE 5 MG PO TABS: 5.0000 mg | ORAL_TABLET | Freq: Two times a day (BID) | ORAL | 2 refills | Status: AC

## 2019-05-05 MED ORDER — ONETOUCH VERIO VI STRP: ORAL_STRIP | 2 refills | Status: AC

## 2019-05-05 NOTE — Progress Notes (Signed)
05/05/2019, 2:28 PM  Endocrinology follow-up note   Subjective:    Patient ID: [name], female    DOB: 08/03/1940.  [name] is being seen in follow-up for management of currently uncontrolled symptomatic type 2 diabetes comp located by stage  3-4 renal insufficiency, hyperlipidemia, hypertension.  She was last seen in February 2020, missed her appointment for a year.  She was found to have hyperglycemia of 500+ by EMS and was advised to resume follow up in this clinic  PMD :  Gareth MorganKnowlton, Steve, MD.   Past Medical History:  Diagnosis Date  . Anxiety   . Arthritis   . Asthma   . Carotid artery aneurysm [facility](HCC)    Right s/p endovascular treatment 2004  . Carpal tunnel syndrome   . Cataract   . CKD (chronic kidney disease) stage 3, GFR 30-59 ml/min   . Depression   . Essential hypertension, benign   . GERD (gastroesophageal reflux disease)   . Glaucoma   . History of recurrent UTIs   . Hx of colonic polyps   . Hx of migraines   . Hypothyroidism   . IBS (irritable bowel syndrome)   . Low back pain   . Morbid obesity (HCC)   . Neurogenic bladder   . Osteoporosis   . Pneumonia   . Sleep apnea    Intolerant to CPAP   . Stroke (HCC)   . Type 2 diabetes mellitus with diabetic neuropathy [facility](HCC)    Past Surgical History:  Procedure Laterality Date  . ABDOMINAL HYSTERECTOMY    . CARDIAC CATHETERIZATION     1980 or 1990  . Carpal tunnel release - bilateral  1992  . CATARACT EXTRACTION W/ INTRAOCULAR LENS IMPLANT Bilateral   . CEREBRAL ANEURYSM REPAIR  2004   Coil   . COLONOSCOPY N/A 03/04/2013   Dr. Darrick PennaFields: moderate diverticulosis, moderate internal hemorrhoids. Next colonoscopy in 5-10 years with history of simple adenomas.  . COLONOSCOPY  06/14/2006   UEA:[id number]:SIMPLE ADENOMAS(2), pTICS, [place] lipoma  . ESOPHAGOGASTRODUODENOSCOPY  08/12/2007   XBJ:YNWGNFSLF:Normal esophagus without evidence of  Barrett's,mass, erosion ulceration or stricture.  The GE junction was at 40 cm.  The Bravo  capsule was placed at 34 cm/ Unable to appreciate a hiatal hernia in the retroflexed viewNormal duodenal bulb and second portion of the duodenum. mild gastritis.   Marland Kitchen. EYE SURGERY    . SHOULDER SURGERY     Rotator cuff repair, 2012  . SPINAL CORD STIMULATOR INSERTION    . Stomach surgery gastropexy for gastric volvulus  2009  . Total hip replacement - right  2002  . TOTAL SHOULDER ARTHROPLASTY Left 01/30/2017   Procedure: LEFT TOTAL SHOULDER ARTHROPLASTY;  Surgeon: Cammy Copaean, Gregory Scott, MD;  [facility];  Service: Orthopedics;  Laterality: Left;  . TUBAL LIGATION    . Tubular Adenoma    . VESICOVAGINAL FISTULA CLOSURE W/ TAH     Social History   Socioeconomic History  . Marital status: Legally Separated    Spouse name: Not on file  . Number of children: Not on file  . Years of education: 12th grade  . Highest education level: Not  on file  Occupational History  . Occupation: Retired    Comment: Building surveyor: RETIRED  Tobacco Use  . Smoking status: Former Smoker    Types: Cigarettes  . Smokeless tobacco: Never Used  Substance and Sexual Activity  . Alcohol use: No  . Drug use: No  . Sexual activity: Not on file  Other Topics Concern  . Not on file  Social History Narrative  . Not on file   Social Determinants of Health   Financial Resource Strain:   . Difficulty of Paying Living Expenses: Not on file  Food Insecurity:   . Worried About Programme researcher, broadcasting/film/video in the Last Year: Not on file  . Ran Out of Food in the Last Year: Not on file  Transportation Needs:   . Lack of Transportation (Medical): Not on file  . Lack of Transportation (Non-Medical): Not on file  Physical Activity:   . Days of Exercise per Week: Not on file  . Minutes of Exercise per Session: Not on file  Stress:   . Feeling of Stress : Not on file  Social Connections:   . Frequency of Communication  with Friends and Family: Not on file  . Frequency of Social Gatherings with Friends and Family: Not on file  . Attends Religious Services: Not on file  . Active Member of Clubs or Organizations: Not on file  . Attends Banker Meetings: Not on file  . Marital Status: Not on file   Outpatient Encounter Medications as of 05/05/2019  Medication Sig  . Cholecalciferol (VITAMIN D3) 5000 units CAPS Take 5,000 Units by mouth daily.  Marland Kitchen dicyclomine (BENTYL) 10 MG capsule Take 1 capsule 2-3 times daily 30 minutes before a meal for abdominal cramping and loose stools. (Patient taking differently: Take 10 mg by mouth 3 (three) times daily as needed for spasms. )  . doxepin (SINEQUAN) 25 MG capsule Take 75 mg by mouth at bedtime.   . Fluticasone-Salmeterol (ADVAIR) 250-50 MCG/DOSE AEPB Inhale 1 puff into the lungs 2 (two) times daily as needed (for shortness of breath).  . gabapentin (NEURONTIN) 100 MG capsule TAKE ONE CAPSULE BY MOUTH 2 TIMES A DAY AS NEEDED FOR PAIN.  . glipiZIDE (GLUCOTROL) 5 MG tablet Take 1 tablet (5 mg total) by mouth 2 (two) times daily with a meal.  . glucose blood (ONETOUCH VERIO) test strip Use as instructed  . glucose blood (ONETOUCH VERIO) test strip Use as instructed  . guaiFENesin (MUCINEX) 600 MG 12 hr tablet Take 600 mg by mouth 2 (two) times daily as needed for cough or to loosen phlegm.  . Insulin Pen Needle (B-D ULTRAFINE III SHORT PEN) 31G X 8 MM MISC 1 each by Does not apply route as directed.  Marland Kitchen levothyroxine (SYNTHROID, LEVOTHROID) 75 MCG tablet Take 1 tablet (75 mcg total) by mouth daily.  Marland Kitchen omeprazole (PRILOSEC) 20 MG capsule Take 20 mg by mouth 2 (two) times daily.   Marland Kitchen PROAIR HFA 108 (90 Base) MCG/ACT inhaler Inhale 2 puffs into the lungs every 6 (six) hours as needed for wheezing.   . Semaglutide,0.25 or 0.5MG /DOS, (OZEMPIC, 0.25 OR 0.5 MG/DOSE,) 2 MG/1.5ML SOPN Inject 0.5 mg into the skin once a week.  . [DISCONTINUED] glipiZIDE (GLUCOTROL) 5 MG tablet  TAKE 1 TABLET DAILY BEFORE BREAKFAST.  . [DISCONTINUED] Semaglutide,0.25 or 0.5MG /DOS, (OZEMPIC, 0.25 OR 0.5 MG/DOSE,) 2 MG/1.5ML SOPN Inject 0.5 mg into the skin once a week.   Facility-Administered Encounter Medications as  of 05/05/2019  Medication  . vancomycin (VANCOCIN) IVPB 1000 mg/200 mL premix    ALLERGIES: Allergies  Allergen Reactions  . Oxycodone Anaphylaxis and Itching  . Codeine Rash  . Hydrocodone Itching and Nausea And Vomiting  . Naproxen Nausea And Vomiting and Other (See Comments)    Hallucinations    VACCINATION STATUS: Immunization History  Administered Date(s) Administered  . Influenza Whole 12/25/2007  . Pneumococcal Polysaccharide-23 12/25/2007    Diabetes She presents for her follow-up diabetic visit. She has type 2 diabetes mellitus. Onset time: She was diagnosed at approximate age of 36 years.. Her disease course has been worsening. There are no hypoglycemic associated symptoms. Pertinent negatives for hypoglycemia include no confusion, headaches, pallor or seizures. Associated symptoms include fatigue. Pertinent negatives for diabetes include no chest pain, no polydipsia, no polyphagia and no polyuria. There are no hypoglycemic complications. Symptoms are worsening. Diabetic complications include nephropathy. Risk factors for coronary artery disease include diabetes mellitus, dyslipidemia, family history, hypertension, obesity, sedentary lifestyle and tobacco exposure. Current diabetic treatment includes oral agent (monotherapy). Her weight is increasing steadily. She is following a generally unhealthy diet. When asked about meal planning, she reported none. She has not had a previous visit with a dietitian. She never participates in exercise. (She is accompanied by her daughter.  She did not bring any logs nor meter with her today.  She reports that she was found to have hypoglycemia of 500 by EMS recently.  She missed her appointment for a year.  Her A1c has  increased from 6.4% to 10.3%.) An ACE inhibitor/angiotensin II receptor blocker is being taken. Eye exam is current.  Hyperlipidemia This is a chronic problem. The current episode started more than 1 year ago. The problem is uncontrolled. Recent lipid tests were reviewed and are high. Exacerbating diseases include diabetes, hypothyroidism and obesity. Pertinent negatives include no chest pain, myalgias or shortness of breath. She is currently on no antihyperlipidemic treatment. Risk factors for coronary artery disease include diabetes mellitus, dyslipidemia, hypertension, obesity, a sedentary lifestyle and post-menopausal.  Hypertension This is a chronic problem. The current episode started more than 1 year ago. The problem is controlled. Pertinent negatives include no chest pain, headaches, palpitations or shortness of breath. Risk factors for coronary artery disease include diabetes mellitus, dyslipidemia, obesity, sedentary lifestyle, smoking/tobacco exposure and post-menopausal state. Past treatments include angiotensin blockers and diuretics.    Review of Systems  Constitutional: Positive for fatigue. Negative for chills, fever and unexpected weight change.  HENT: Negative for trouble swallowing and voice change.   Eyes: Negative for visual disturbance.  Respiratory: Negative for cough, shortness of breath and wheezing.   Cardiovascular: Negative for chest pain, palpitations and leg swelling.  Gastrointestinal: Negative for diarrhea, nausea and vomiting.  Endocrine: Negative for cold intolerance, heat intolerance, polydipsia, polyphagia and polyuria.  Musculoskeletal: Positive for gait problem. Negative for arthralgias and myalgias.  Skin: Negative for color change, pallor, rash and wound.  Neurological: Negative for seizures and headaches.  Psychiatric/Behavioral: Negative for confusion and suicidal ideas.    Objective:    BP (!) 166/88   Pulse 80   Ht 5' (1.524 m)   Wt 218 lb 9.6 oz  (99.2 kg)   BMI 42.69 kg/m   Wt Readings from Last 3 Encounters:  05/05/19 218 lb 9.6 oz (99.2 kg)  05/06/18 216 lb (98 kg)  04/15/18 210 lb (95.3 kg)     Physical Exam  Constitutional: She is oriented to person, place, and time. She  appears well-developed.  Uses a cane to walk due to recent hip replacement and diffuse joint arthritis.   HENT:  Head: Normocephalic and atraumatic.  Eyes: EOM are normal.  Neck: No tracheal deviation present. No thyromegaly present.  Cardiovascular: Normal rate and regular rhythm.  Pulmonary/Chest: Effort normal and breath sounds normal.  Abdominal: Soft. Bowel sounds are normal. There is no abdominal tenderness. There is no guarding.  Musculoskeletal:        General: No edema.     Cervical back: Normal range of motion and neck supple.  Neurological: She is alert and oriented to person, place, and time. She has normal reflexes. No cranial nerve deficit. Coordination normal.  Skin: Skin is warm and dry. No rash noted. No erythema. No pallor.  Psychiatric: She has a normal mood and affect. Judgment normal.    CMP ( most recent) CMP     Component Value Date/Time   NA 135 04/22/2019 1620   NA 142 07/10/2012 1135   K 4.4 04/22/2019 1620   K 4.4 07/10/2012 1135   CL 102 04/22/2019 1620   CL 111 (H) 07/10/2012 1135   CO2 24 04/22/2019 1620   CO2 25 07/10/2012 1135   GLUCOSE 324 (H) 04/22/2019 1620   GLUCOSE 148 (H) 07/10/2012 1135   BUN 24 04/22/2019 1620   BUN 22 (H) 07/10/2012 1135   CREATININE 1.45 (H) 04/22/2019 1620   CALCIUM 8.4 (L) 04/22/2019 1620   CALCIUM 8.8 07/10/2012 1135   PROT 7.2 04/22/2019 1620   ALBUMIN 4.0 10/23/2017 1422   AST 25 04/22/2019 1620   ALT 27 04/22/2019 1620   ALKPHOS 98 10/23/2017 1422   BILITOT 0.4 04/22/2019 1620   GFRNONAA 34 (L) 04/22/2019 1620   GFRAA 39 (L) 04/22/2019 1620     Diabetic Labs (most recent): Lab Results  Component Value Date   HGBA1C 10.3 (H) 04/22/2019   HGBA1C 6.4 (H) 04/18/2018    HGBA1C 7.0 (H) 12/10/2017     Lipid Panel ( most recent) Lipid Panel     Component Value Date/Time   CHOL 171 05/09/2017 1536   TRIG 120 05/09/2017 1536   HDL 62 05/09/2017 1536   CHOLHDL 2.8 05/09/2017 1536   VLDL 22 06/23/2008 2159   LDLCALC 87 05/09/2017 1536      Lab Results  Component Value Date   TSH 1.34 04/22/2019   TSH 0.72 04/18/2018   TSH 1.19 12/10/2017   TSH 0.73 08/08/2017   TSH 0.31 (L) 05/09/2017   TSH 2.886 11/20/2007   TSH 1.589 11/06/2007   FREET4 0.8 04/22/2019   FREET4 1.3 04/18/2018   FREET4 1.0 12/10/2017   FREET4 1.1 08/08/2017   FREET4 1.1 05/09/2017      Assessment & Plan:   1. Type 2 diabetes mellitus with stage 3 chronic kidney disease, without long-term current use of insulin (HCC)  - [name] has currently uncontrolled symptomatic type 2 DM since  79 years of age. -After missing her appointment for a year, returns because of recent hypoglycemia.  Her A1c has increased to 10.3% from 6.4%.    -Recent labs reviewed.  -her diabetes is complicated by stage 3 renal insufficiency, obesity/sedentary life, history of smoking and [name] remains at a high risk for more acute and chronic complications which include CAD, CVA, CKD, retinopathy, and neuropathy. These are all discussed in detail with the patient.  - I have counseled her on diet management and weight loss, by adopting a carbohydrate restricted/protein rich  diet.  - she  admits there is a room for improvement in her diet and drink choices. -  Suggestion is made for her to avoid simple carbohydrates  from her diet including Cakes, Sweet Desserts / Pastries, Ice Cream, Soda (diet and regular), Sweet Tea, Candies, Chips, Cookies, Sweet Pastries,  Store Bought Juices, Alcohol in Excess of  1-2 drinks a day, Artificial Sweeteners, Coffee Creamer, and "Sugar-free" Products. This will help patient to have stable blood glucose profile and potentially avoid unintended weight  gain.   - I encouraged her to switch to  unprocessed or minimally processed complex starch and increased protein intake (animal or plant source), fruits, and vegetables.  - she is advised to stick to a routine mealtimes to eat 3 meals  a day and avoid unnecessary snacks ( to snack only to correct hypoglycemia).   - she has been scheduled with Norm SaltPenny Crumpton, RDN, CDE for individualized diabetes education- consult pending.  - I have approached her with the following individualized plan to manage diabetes and patient agrees:   -Her point-of-care A1c today random and 333.  She may need insulin treatment in order for her to achieve and maintain control of diabetes to target.    -I had a long discussion with her and her daughter who is offering to help, to start monitoring blood glucose 4 times a day.  A new meter and supplies are prescribed for her.    -She is encouraged to call clinic  for blood glucose levels less than 70 or above 300 mg /dl.  -In the meantime, she is advised to increase her glipizide 5 mg p.o. twice daily with breakfast and with supper.  She is also advised to resume Ozempic 0.5 mg subcutaneously weekly, samples were given from clinic.   - Patient specific target  A1c;  LDL, HDL, Triglycerides, and  Waist Circumference were discussed in detail.  2) BP/HTN: Her blood pressure is not controlled to target.  She is advised to continue her current blood pressure medications including Cozaar 25 mg p.o. Daily.  3) Lipids/HPL: Her recent lipid panel showed controlled LDL at 87, proving from 103. -She did not bring her medications for review today.  Her medication list  does not include statin.  She is advised to bring all of her medications with her next visit in 1 week for reconciliation.    4)  Weight/Diet: CDE Consult is progress,  exercise, and detailed carbohydrates information provided.  5) Hypothyroidism - Etiology unclear.  Her thyroid function tests are consistent with  appropriate replacement.  She is advised to continue levothyroxine 75 mcg p.o. every morning.  - We discussed about the correct intake of her thyroid hormone, on empty stomach at fasting, with water, separated by at least 30 minutes from breakfast and other medications,  and separated by more than 4 hours from calcium, iron, multivitamins, acid reflux medications (PPIs). -Patient is made aware of the fact that thyroid hormone replacement is needed for life, dose to be adjusted by periodic monitoring of thyroid function tests.   6) Chronic Care/Health Maintenance:  -she  is on ARB   is encouraged to continue to follow up with Ophthalmology, Dentist,  Podiatrist at least yearly or according to recommendations, and advised to   stay away from smoking. I have recommended yearly flu vaccine and pneumonia vaccination at least every 5 years; moderate intensity exercise for up to 150 minutes weekly; and  sleep for at least 7 hours a day.  -  I advised patient to maintain close follow up with Gareth Morgan, MD for primary care needs.  - Time spent on this patient care encounter:  35 min, of which > 50% was spent in  counseling and the rest reviewing her blood glucose logs , discussing her hypoglycemia and hyperglycemia episodes, reviewing her current and  previous labs / studies  ( including abstraction from other facilities) and medications  doses and developing a  long term treatment plan and documenting her care.   Please refer to Patient Instructions for Blood Glucose Monitoring and Insulin/Medications Dosing Guide"  in media tab for additional information. Please  also refer to " Patient Self Inventory" in the Media  tab for reviewed elements of pertinent patient history.  [name] participated in the discussions, expressed understanding, and voiced agreement with the above plans.  All questions were answered to her satisfaction. she is encouraged to contact clinic should she have any questions or  concerns prior to her return visit.   Follow up plan: - Return in about 1 week (around 05/12/2019), or office, for Follow up with Meter and Logs Only - no Labs.  Marquis Lunch, MD [facility] [address] [place], [place] [address] Phone: [phone]  Fax: [phone]    05/05/2019, 2:28 PM  This note was partially dictated with voice recognition software. Similar sounding words can be transcribed inadequately or may not  be corrected upon review.

## 2019-05-05 NOTE — Patient Instructions (Signed)

## 2019-05-07 ENCOUNTER — Telehealth: Payer: Self-pay | Admitting: "Endocrinology

## 2019-05-07 NOTE — Telephone Encounter (Signed)
Pt said she checked her sugar this morning and it was 447. She went to urgent care. She will continue to use old meter. Today was the first day she has checked it.

## 2019-05-07 NOTE — Telephone Encounter (Signed)
Pt said she can not use the meter you gave her the other day. She has tried to use it several times and can not understand it. She said she has not done anything you have said since she was here. She did check her sugar just a few minutes ago with her old meter and it was over 400. I asked her could she just continue to use the old one and she said no that you wanted to use the one touch verio. Please advise.

## 2019-05-07 NOTE — Telephone Encounter (Signed)
Yes, she can use her old meter if she has enough meter. Test 4 times daily.

## 2019-05-07 NOTE — Telephone Encounter (Signed)
Noted  

## 2019-05-07 NOTE — Telephone Encounter (Signed)
She found her another doctor today. She is not coming back.

## 2019-05-12 ENCOUNTER — Ambulatory Visit: Payer: Medicare Other | Admitting: "Endocrinology

## 2019-06-02 DEATH — deceased
